# Patient Record
Sex: Female | Born: 1950
Health system: Southern US, Community
[De-identification: ages and names within clinical notes are randomized; demographics above are authoritative.]

## PROBLEM LIST (undated history)

## (undated) DIAGNOSIS — J029 Acute pharyngitis, unspecified: Secondary | ICD-10-CM

## (undated) DIAGNOSIS — N200 Calculus of kidney: Secondary | ICD-10-CM

## (undated) DIAGNOSIS — D649 Anemia, unspecified: Secondary | ICD-10-CM

## (undated) DIAGNOSIS — M810 Age-related osteoporosis without current pathological fracture: Secondary | ICD-10-CM

## (undated) DIAGNOSIS — D6800 Von Willebrand disease, unspecified: Secondary | ICD-10-CM

## (undated) HISTORY — DX: Anemia, unspecified: D64.9

## (undated) HISTORY — PX: BREAST SURGERY: SHX581

## (undated) HISTORY — DX: Acute pharyngitis, unspecified: J02.9

---
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
## 2016-07-28 ENCOUNTER — Encounter (HOSPITAL_BASED_OUTPATIENT_CLINIC_OR_DEPARTMENT_OTHER): Payer: Self-pay | Admitting: Emergency Medicine

## 2016-07-28 DIAGNOSIS — D680 Von Willebrand's disease: Principal | ICD-10-CM | POA: Insufficient documentation

## 2016-07-28 DIAGNOSIS — D696 Thrombocytopenia, unspecified: Secondary | ICD-10-CM | POA: Diagnosis not present

## 2016-07-28 DIAGNOSIS — D62 Acute posthemorrhagic anemia: Secondary | ICD-10-CM | POA: Diagnosis not present

## 2016-07-28 DIAGNOSIS — K068 Other specified disorders of gingiva and edentulous alveolar ridge: Secondary | ICD-10-CM | POA: Diagnosis present

## 2016-07-28 NOTE — ED Triage Notes (Signed)
Pt reports bleeding from gums since having a dental cleaning earlier today. Pt has hx of clotting disorder.

## 2016-07-29 ENCOUNTER — Encounter (HOSPITAL_COMMUNITY): Payer: Self-pay | Admitting: Internal Medicine

## 2016-07-29 ENCOUNTER — Observation Stay (HOSPITAL_BASED_OUTPATIENT_CLINIC_OR_DEPARTMENT_OTHER)
Admission: EM | Admit: 2016-07-29 | Discharge: 2016-07-29 | Disposition: A | Discharge status: Home / Self Care | Payer: Medicare PPO | Attending: Internal Medicine | Admitting: Internal Medicine

## 2016-07-29 DIAGNOSIS — D696 Thrombocytopenia, unspecified: Secondary | ICD-10-CM | POA: Diagnosis present

## 2016-07-29 DIAGNOSIS — D68020 Von Willebrand disease, type 2a: Secondary | ICD-10-CM | POA: Diagnosis present

## 2016-07-29 DIAGNOSIS — D62 Acute posthemorrhagic anemia: Secondary | ICD-10-CM | POA: Diagnosis present

## 2016-07-29 DIAGNOSIS — IMO0002 Reserved for concepts with insufficient information to code with codable children: Secondary | ICD-10-CM | POA: Diagnosis present

## 2016-07-29 DIAGNOSIS — D6800 Von Willebrand disease, unspecified: Secondary | ICD-10-CM

## 2016-07-29 DIAGNOSIS — K068 Other specified disorders of gingiva and edentulous alveolar ridge: Secondary | ICD-10-CM

## 2016-07-29 DIAGNOSIS — K055 Other periodontal diseases: Secondary | ICD-10-CM

## 2016-07-29 HISTORY — DX: Von Willebrand disease, unspecified: D68.00

## 2016-07-29 HISTORY — DX: Von Willebrand's disease: D68.0

## 2016-07-29 LAB — BASIC METABOLIC PANEL
ANION GAP: 5 (ref 5–15)
Anion gap: 8 (ref 5–15)
BUN: 30 mg/dL — AB (ref 6–20)
BUN: 27 mg/dL — AB (ref 6–20)
CALCIUM: 9.2 mg/dL (ref 8.9–10.3)
CALCIUM: 9.1 mg/dL (ref 8.9–10.3)
CO2: 24 mmol/L (ref 22–32)
CO2: 25 mmol/L (ref 22–32)
CREATININE: 0.77 mg/dL (ref 0.44–1.00)
CREATININE: 0.85 mg/dL (ref 0.44–1.00)
Chloride: 104 mmol/L (ref 101–111)
Chloride: 110 mmol/L (ref 101–111)
GFR calc Af Amer: >60 mL/min (ref >60)
GFR calc Af Amer: >60 mL/min (ref >60)
GLUCOSE: 91 mg/dL (ref 65–99)
GLUCOSE: 93 mg/dL (ref 65–99)
Potassium: 4.1 mmol/L (ref 3.5–5.1)
Potassium: 4.5 mmol/L (ref 3.5–5.1)
Sodium: 136 mmol/L (ref 135–145)
Sodium: 140 mmol/L (ref 135–145)

## 2016-07-29 LAB — CBC WITH DIFFERENTIAL/PLATELET
BASOS ABS: 0 10*3/uL (ref 0.0–0.1)
Basophils Relative: 0 %
EOS PCT: 1 %
Eosinophils Absolute: 0.1 10*3/uL (ref 0.0–0.7)
HEMATOCRIT: 36.8 % (ref 36.0–46.0)
Hemoglobin: 12.2 g/dL (ref 12.0–15.0)
LYMPHS PCT: 23 %
Lymphs Abs: 2.5 10*3/uL (ref 0.7–4.0)
MCH: 29.4 pg (ref 26.0–34.0)
MCHC: 33.2 g/dL (ref 30.0–36.0)
MCV: 88.7 fL (ref 78.0–100.0)
MONO ABS: 0.7 10*3/uL (ref 0.1–1.0)
MONOS PCT: 7 %
NEUTROS ABS: 7.5 10*3/uL (ref 1.7–7.7)
Neutrophils Relative %: 69 %
Platelets: 94 10*3/uL — ABNORMAL LOW (ref 150–400)
RBC: 4.15 MIL/uL (ref 3.87–5.11)
RDW: 14 % (ref 11.5–15.5)
WBC: 10.8 10*3/uL — ABNORMAL HIGH (ref 4.0–10.5)

## 2016-07-29 LAB — HEMOGLOBIN AND HEMATOCRIT, BLOOD
HCT: 36.6 % (ref 36.0–46.0)
Hemoglobin: 11.8 g/dL — ABNORMAL LOW (ref 12.0–15.0)

## 2016-07-29 MED ORDER — DESMOPRESSIN ACETATE SPRAY 0.01 % NA SOLN: 2.0000 | Freq: Every day | NASAL | 12 refills | Status: DC | PRN

## 2016-07-29 MED ORDER — SODIUM CHLORIDE 0.9 % IV SOLN
0.3000 ug/kg | Freq: Once | INTRAVENOUS | Status: AC
  Administered 2016-07-29: 19.2 ug via INTRAVENOUS
  Filled 2016-07-29: qty 4.8

## 2016-07-29 MED ORDER — DESMOPRESSIN ACETATE 4 MCG/ML IJ SOLN: 0.3000 ug/kg | Freq: Once | INTRAMUSCULAR | Status: DC

## 2016-07-29 NOTE — Discharge Summary (Signed)
Physician Discharge Summary  Meredith Dominguez ZOX:[id number] DOB: 08/16/1951 DOA: 07/29/2016  PCP: Kathyrn LassJones, Champ M, MD  Admit date: 07/29/2016 Discharge date: 07/29/2016  Admitted From: Home Discharge disposition: Home    Recommendations for Outpatient Follow-Up:   1. Recommend follow-up with PCP in one week or sooner if there are issues with ongoing bleeding.   Discharge Diagnosis:   Principal Problem:    Von Willebrand disease, type IIa (HCC) with acute blood loss anemia status post dental procedure    Thrombocytopenia  Discharge Condition: Improved.  Diet recommendation: Regular.  Wound care: None.   History of Present Illness:   65 year old woman with a PMH of von Willebrand's disease type IIa who developed bleeding from her gums after dental cleansing after failing to take her preprocedure dose of desmopressin.  Hospital Course by Problem:   Von Willebrand disease, type IIa (HCC) with acute blood loss anemia status post dental procedure/Thrombocytopenia Patient's platelet count was at her usual baseline values. Patient was observed overnight, and given desmopressin intravenously. The bleeding had largely stopped by the morning of 07/29/16. There was a mild drop in her hemoglobin from 12.2 ---> 11.8 g/dL. She was given a prescription with refills for her intranasal desmopressin. She was instructed to follow-up with her PCP for ongoing problems with bleeding.   Medical Consultants:    None.   Discharge Exam:   Vitals:   07/29/16 0431 07/29/16 1544  BP: 131/77 (!) 109/54  Pulse: 90 88  Resp: 18 18  Temp: 98.7 F (37.1 C) 98.2 F (36.8 C)   Vitals:   07/29/16 0053 07/29/16 0241 07/29/16 0431 07/29/16 1544  BP: 145/81 130/76 131/77 (!) 109/54  Pulse:  80 90 88  Resp: 18 18 18 18   Temp:   98.7 F (37.1 C) 98.2 F (36.8 C)  TempSrc:   Oral Oral  SpO2:  99% 98% 100%  Weight:   63.5 kg (140 lb)   Height:   6' (1.829 m)     General exam: Mildly  anxious. Oropharynx: No visible bleeding from the gums, but the patient does expectorate pink tinged sputum. Respiratory system: Clear to auscultation. Respiratory effort normal. Cardiovascular system: S1 & S2 heard, RRR. No JVD,  rubs, gallops or clicks. No murmurs. Gastrointestinal system: Abdomen is nondistended, soft and nontender. No organomegaly or masses felt. Normal bowel sounds heard. Central nervous system: Alert and oriented. No focal neurological deficits.  The results of significant diagnostics from this hospitalization (including imaging, microbiology, ancillary and laboratory) are listed below for reference.     Procedures and Diagnostic Studies:   No results found.   Labs:   Basic Metabolic Panel:  Recent Labs Lab 07/29/16 0125 07/29/16 1154  NA 136 140  K 4.1 4.5  CL 104 110  CO2 24 25  GLUCOSE 91 93  BUN 30* 27*  CREATININE 0.77 0.85  CALCIUM 9.2 9.1   GFR Estimated Creatinine Clearance: 64.4 mL/min (by C-G formula based on SCr of 0.85 mg/dL). Liver Function Tests: No results for input(s): AST, ALT, ALKPHOS, BILITOT, PROT, ALBUMIN in the last 168 hours. No results for input(s): LIPASE, AMYLASE in the last 168 hours. No results for input(s): AMMONIA in the last 168 hours. Coagulation profile No results for input(s): INR, PROTIME in the last 168 hours.  CBC:  Recent Labs Lab 07/29/16 0125 07/29/16 1154  WBC 10.8*  --   NEUTROABS 7.5  --   HGB 12.2 11.8*  HCT 36.8 36.6  MCV 88.7  --  PLT 94*  --      Discharge Instructions:   Discharge Instructions    Call MD for:    Complete by:  As directed   Ongoing bleeding.   Call MD for:  extreme fatigue    Complete by:  As directed   Call MD for:  persistant dizziness or light-headedness    Complete by:  As directed   Diet general    Complete by:  As directed   Discharge instructions    Complete by:  As directed   You may use the intranasal desmopressin again this evening for ongoing bleeding.   This medicine should not be used more often than every 14 hours.  You had an IV dose of this medicine at 9 a.m. There fore you should not use it again until 11 pm, if it is needed. Your hemoglobin is stable.  You should call your primary care doctor if you have ongoing problems with bleeding.   Increase activity slowly    Complete by:  As directed       Medication List    TAKE these medications   desmopressin 0.01 % solution Commonly known as:  DDAVP NASAL Place 2 sprays (20 mcg total) into the nose daily as needed. Nose bleeds   OVER THE COUNTER MEDICATION Take 2 tablets by mouth 3 (three) times daily.   OVER THE COUNTER MEDICATION 3 tablets daily   OVER THE COUNTER MEDICATION Take 1 tablet by mouth 2 (two) times daily.   OVER THE COUNTER MEDICATION Take 1 tablet by mouth 3 (three) times daily.      Follow-up Information    Kathyrn Lass, MD. Schedule an appointment as soon as possible for a visit in 1 week(s).   Specialty:  Family Medicine Why:  Hospital follow up. Contact information: [address] [place] [address] [phone]            Time coordinating discharge: 25 minutes.  Signed:  Euclide Granito  Pager [phone] [facility] 07/29/2016, 4:11 PM

## 2016-07-29 NOTE — ED Provider Notes (Signed)
[facility] Provider Note   CSN: [id number] Arrival date & time: 07/28/16  2205     History   Chief Complaint Chief Complaint  Patient presents with  . Coagulation Disorder    HPI Meredith Dominguez is a 65 y.o. female with a history of von Willebrand's disease. She had a dental cleaning at 4 PM yesterday afternoon. About 5 PM she started bleeding from her gums. Bleeding has been moderate and persistent. She had forgotten to take her preprocedure dose of desmopressin but did take it about 9 PM without its usual beneficial effects. She denies pain.   HPI  Past Medical History:  Diagnosis Date  . Von Willebrand disease [facility](HCC)     Patient Active Problem List   Diagnosis Date Noted  . Post-op bleeding 07/29/2016    History reviewed. No pertinent surgical history.  OB History    No data available       Home Medications    Prior to Admission medications   Not on File    Family History No family history on file.  Social History Social History  Substance Use Topics  . Smoking status: Never Smoker  . Smokeless tobacco: Never Used  . Alcohol use 0.6 oz/week    1 Glasses of wine per week     Allergies   Sulfa antibiotics   Review of Systems Review of Systems  All other systems reviewed and are negative.   Physical Exam Updated Vital Signs BP 130/76 (BP Location: Right Arm)   Pulse 80   Temp 98.1 F (36.7 C) (Oral)   Resp 18   Ht 5\' 7"  (1.702 m)   Wt 140 lb (63.5 kg)   SpO2 99%   BMI 21.93 kg/m   Physical Exam General: Well-developed, well-nourished female in no acute distress; appearance consistent with age of record HENT: normocephalic; atraumatic; bleeding from multiple sites of gums Eyes: pupils equal, round and reactive to light; extraocular muscles intact Neck: supple Heart: regular rate and rhythm Lungs: clear to auscultation bilaterally Abdomen: soft; nondistended; nontender; no masses or hepatosplenomegaly; bowel sounds  present Extremities: No deformity; full range of motion; pulses normal Neurologic: Awake, alert and oriented; motor function intact in all extremities and symmetric; no facial droop Skin: Warm and dry Psychiatric: Normal mood and affect   ED Treatments / Results  Nursing notes and vitals signs, including pulse oximetry, reviewed.  Summary of this visit's results, reviewed by myself:  Labs:  Results for orders placed or performed during the hospital encounter of 07/29/16 (from the past 24 hour(s))  CBC with Differential/Platelet     Status: Abnormal   Collection Time: 07/29/16  1:25 AM  Result Value Ref Range   WBC 10.8 (H) 4.0 - 10.5 K/uL   RBC 4.15 3.87 - 5.11 MIL/uL   Hemoglobin 12.2 12.0 - 15.0 g/dL   HCT 40.936.8 81.136.0 - 91.446.0 %   MCV 88.7 78.0 - 100.0 fL   MCH 29.4 26.0 - 34.0 pg   MCHC 33.2 30.0 - 36.0 g/dL   RDW 78.214.0 95.611.5 - 21.315.5 %   Platelets 94 (L) 150 - 400 K/uL   Neutrophils Relative % 69 %   Neutro Abs 7.5 1.7 - 7.7 K/uL   Lymphocytes Relative 23 %   Lymphs Abs 2.5 0.7 - 4.0 K/uL   Monocytes Relative 7 %   Monocytes Absolute 0.7 0.1 - 1.0 K/uL   Eosinophils Relative 1 %   Eosinophils Absolute 0.1 0.0 - 0.7 K/uL   Basophils  Relative 0 %   Basophils Absolute 0.0 0.0 - 0.1 K/uL  Basic metabolic panel     Status: Abnormal   Collection Time: 07/29/16  1:25 AM  Result Value Ref Range   Sodium 136 135 - 145 mmol/L   Potassium 4.1 3.5 - 5.1 mmol/L   Chloride 104 101 - 111 mmol/L   CO2 24 22 - 32 mmol/L   Glucose, Bld 91 65 - 99 mg/dL   BUN 30 (H) 6 - 20 mg/dL   Creatinine, Ser 5.78 0.44 - 1.00 mg/dL   Calcium 9.2 8.9 - 46.9 mg/dL   GFR calc non Af Amer >60 >60 mL/min   GFR calc Af Amer >60 >60 mL/min   Anion gap 8 5 - 15    1:38 AM  Tranexamic acid or aminocaproic acid are the agents used adjunctively with desmopressin in the treatment of mucosal von Willebrand's related bleeding. Neither of these drugs are stocked at this facility. We will transfer her to [facility]  for further treatment. Dr. Julian Reil is the accepting physician.  Procedures (including critical care time)   Final Clinical Impressions(s) / ED Diagnoses   Final diagnoses:  Bleeding gums  Von Willebrand disease (HCC)      Paula Libra, MD 07/29/16 0425

## 2016-07-29 NOTE — Progress Notes (Signed)
Pt discharged to home.  Discharge instructions explained to pt.  Pt has no questions at the time of discharge.  Pt states she has all belongings.  IV removed.  Pt ambulated off unit on her own.   

## 2016-07-29 NOTE — Care Management Obs Status (Signed)
MEDICARE OBSERVATION STATUS NOTIFICATION   Patient Details  Name: Meredith Dominguez MRN: [id number] Date of Birth: 06/24/1951   Medicare Observation Status Notification Given:  Yes    Kingsley PlanWile, Tymber Stallings Marie, RN 07/29/2016, 10:13 AM

## 2016-07-29 NOTE — H&P (Addendum)
History and Physical    Meredith ArisMarilyn Nahm ZOX:[id number] DOB: 01/20/1951 DOA: 07/29/2016   PCP: Kathyrn LassJones, Champ M, MD Chief Complaint:  Chief Complaint  Patient presents with  . Coagulation Disorder    HPI: Meredith Dominguez is a 65 y.o. female with medical history significant of von Willebrand's disease. She had a dental cleaning at 4 PM yesterday afternoon. About 5 PM she started bleeding from her gums. Bleeding has been moderate and persistent. She had forgotten to take her preprocedure dose of desmopressin but did take it about 9 PM without its usual beneficial effects.  She does note that this DDAVP she took was stored in a fridge, and previously was stored at above the 78c that it was supposed to be.  Also the bottle she shows me expired in March of this year.  She denies pain.  ED Course: Bleeding has been persistent, [facility]  Review of Systems: As per HPI otherwise 10 point review of systems negative.    Past Medical History:  Diagnosis Date  . Von Willebrand disease (HCC)     History reviewed. No pertinent surgical history.   reports that she has never smoked. She has never used smokeless tobacco. She reports that she drinks about 0.6 oz of alcohol per week . Her drug history is not on file.  Allergies  Allergen Reactions  . Sulfa Antibiotics Rash    Family History  Problem Relation Age of Onset  . Leukemia Cousin   . Bleeding Disorder Neg Hx    "everyone" on moms side had cancer, but no known VWD or bleeding disorders.   Prior to Admission medications   Not on File    Physical Exam: Vitals:   07/28/16 2210 07/29/16 0053 07/29/16 0241 07/29/16 0431  BP: 143/81 145/81 130/76 131/77  Pulse: 78  80 90  Resp: 18 18 18 18   Temp: 98.1 F (36.7 C)   98.7 F (37.1 C)  TempSrc: Oral   Oral  SpO2: 96%  99% 98%  Weight: 63.5 kg (140 lb)   63.5 kg (140 lb)  Height: 5\' 7"  (1.702 m)   6' (1.829 m)      Constitutional: NAD, calm, comfortable Eyes:  PERRL, lids and conjunctivae normal ENMT: Mucous membranes are moist. Posterior pharynx clear of any exudate or lesions.Normal dentition.  Neck: normal, supple, no masses, no thyromegaly Respiratory: clear to auscultation bilaterally, no wheezing, no crackles. Normal respiratory effort. No accessory muscle use.  Cardiovascular: Regular rate and rhythm, no murmurs / rubs / gallops. No extremity edema. 2+ pedal pulses. No carotid bruits.  Abdomen: no tenderness, no masses palpated. No hepatosplenomegaly. Bowel sounds positive.  Musculoskeletal: no clubbing / cyanosis. No joint deformity upper and lower extremities. Good ROM, no contractures. Normal muscle tone.  Skin: no rashes, lesions, ulcers. No induration Neurologic: CN 2-12 grossly intact. Sensation intact, DTR normal. Strength 5/5 in all 4.  Psychiatric: Normal judgment and insight. Alert and oriented x 3. Normal mood.    Labs on Admission: I have personally reviewed following labs and imaging studies  CBC:  Recent Labs Lab 07/29/16 0125  WBC 10.8*  NEUTROABS 7.5  HGB 12.2  HCT 36.8  MCV 88.7  PLT 94*   Basic Metabolic Panel:  Recent Labs Lab 07/29/16 0125  NA 136  K 4.1  CL 104  CO2 24  GLUCOSE 91  BUN 30*  CREATININE 0.77  CALCIUM 9.2   GFR: Estimated Creatinine Clearance: 68.4 mL/min (by C-G formula based on SCr  of 0.8 mg/dL). Liver Function Tests: No results for input(s): AST, ALT, ALKPHOS, BILITOT, PROT, ALBUMIN in the last 168 hours. No results for input(s): LIPASE, AMYLASE in the last 168 hours. No results for input(s): AMMONIA in the last 168 hours. Coagulation Profile: No results for input(s): INR, PROTIME in the last 168 hours. Cardiac Enzymes: No results for input(s): CKTOTAL, CKMB, CKMBINDEX, TROPONINI in the last 168 hours. BNP (last 3 results) No results for input(s): PROBNP in the last 8760 hours. HbA1C: No results for input(s): HGBA1C in the last 72 hours. CBG: No results for input(s): GLUCAP  in the last 168 hours. Lipid Profile: No results for input(s): CHOL, HDL, LDLCALC, TRIG, CHOLHDL, LDLDIRECT in the last 72 hours. Thyroid Function Tests: No results for input(s): TSH, T4TOTAL, FREET4, T3FREE, THYROIDAB in the last 72 hours. Anemia Panel: No results for input(s): VITAMINB12, FOLATE, FERRITIN, TIBC, IRON, RETICCTPCT in the last 72 hours. Urine analysis: No results found for: COLORURINE, APPEARANCEUR, LABSPEC, PHURINE, GLUCOSEU, HGBUR, BILIRUBINUR, KETONESUR, PROTEINUR, UROBILINOGEN, NITRITE, LEUKOCYTESUR Sepsis Labs: @LABRCNTIP (procalcitonin:4,lacticidven:4) )No results found for this or any previous visit (from the past 240 hour(s)).   Radiological Exams on Admission: No results found.  EKG: Independently reviewed.  Assessment/Plan Active Problems:   Post-op bleeding   Von Willebrand disease, type IIa (HCC)    1. vWD type 2a -  1. Type determined from Dr. Nicky Pugh notes in care everywhere in epic. 2. Spoke with Dr. Shirline Frees: 1. Type 2a should be responsive to DDAVP 1. See HPI but it is unclear wether the nasal spray she took today was effective due to expiration, storage issues, etc. 2. Give 0.44mcg/kg DDAVP dose x1 3. If this fails then next step will be Amicar 3. Warned patient not to drink too much fluid after taking DDAVP 4. Will recheck BMP at noon to keep an eye on sodium   DVT prophylaxis: SCDs only due to active bleed Code Status: Full Family Communication: Daughter at bedside Consults called: Spoke with Dr. Shirline Frees over phone Admission status: Admit to obs   Hillary Bow DO [facility] Pager [phone] from 7PM-7AM  If 7AM-7PM, please contact the day physician for the patient [email or web address] Password [email or web address]  07/29/2016, 6:16 AM

## 2016-07-29 NOTE — ED Notes (Signed)
Pt leaving [facility] via P[facility] 6[facility] is expecting her. Also ED charge RN Onalee HuaDavid notified that [facility]

## 2016-07-29 NOTE — ED Notes (Signed)
Patient denies pain and is resting comfortably.  

## 2016-07-29 NOTE — Progress Notes (Signed)
Offered pt. A bath and pt. Refused stating she is going home so she will do it at home

## 2016-12-02 DIAGNOSIS — D696 Thrombocytopenia, unspecified: Secondary | ICD-10-CM | POA: Diagnosis not present

## 2016-12-02 DIAGNOSIS — D680 Von Willebrand's disease: Secondary | ICD-10-CM | POA: Diagnosis not present

## 2016-12-22 ENCOUNTER — Emergency Department (HOSPITAL_BASED_OUTPATIENT_CLINIC_OR_DEPARTMENT_OTHER)
Admission: EM | Admit: 2016-12-22 | Discharge: 2016-12-22 | Disposition: A | Discharge status: Home / Self Care | Payer: PPO | Attending: Emergency Medicine | Admitting: Emergency Medicine

## 2016-12-22 ENCOUNTER — Telehealth: Payer: Self-pay | Admitting: Family Medicine

## 2016-12-22 ENCOUNTER — Encounter (HOSPITAL_BASED_OUTPATIENT_CLINIC_OR_DEPARTMENT_OTHER): Payer: Self-pay | Admitting: *Deleted

## 2016-12-22 ENCOUNTER — Emergency Department (HOSPITAL_BASED_OUTPATIENT_CLINIC_OR_DEPARTMENT_OTHER): Payer: PPO

## 2016-12-22 DIAGNOSIS — R05 Cough: Secondary | ICD-10-CM | POA: Diagnosis not present

## 2016-12-22 DIAGNOSIS — J029 Acute pharyngitis, unspecified: Secondary | ICD-10-CM | POA: Diagnosis not present

## 2016-12-22 DIAGNOSIS — D6800 Von Willebrand disease, unspecified: Secondary | ICD-10-CM

## 2016-12-22 DIAGNOSIS — D696 Thrombocytopenia, unspecified: Secondary | ICD-10-CM

## 2016-12-22 DIAGNOSIS — J111 Influenza due to unidentified influenza virus with other respiratory manifestations: Secondary | ICD-10-CM | POA: Diagnosis not present

## 2016-12-22 DIAGNOSIS — R509 Fever, unspecified: Secondary | ICD-10-CM | POA: Diagnosis not present

## 2016-12-22 DIAGNOSIS — R69 Illness, unspecified: Secondary | ICD-10-CM

## 2016-12-22 LAB — URINALYSIS, ROUTINE W REFLEX MICROSCOPIC
Bilirubin Urine: NEGATIVE
Glucose, UA: NEGATIVE mg/dL
KETONES UR: NEGATIVE mg/dL
LEUKOCYTES UA: NEGATIVE
NITRITE: NEGATIVE
PH: 6.5 (ref 5.0–8.0)
Protein, ur: 100 mg/dL — AB
SPECIFIC GRAVITY, URINE: 1.015 (ref 1.005–1.030)

## 2016-12-22 LAB — RAPID STREP SCREEN (MED CTR MEBANE ONLY): Streptococcus, Group A Screen (Direct): NEGATIVE

## 2016-12-22 LAB — CBC
HCT: 39.5 % (ref 36.0–46.0)
HEMOGLOBIN: 13.2 g/dL (ref 12.0–15.0)
MCH: 29.1 pg (ref 26.0–34.0)
MCHC: 33.4 g/dL (ref 30.0–36.0)
MCV: 87.2 fL (ref 78.0–100.0)
Platelets: 37 10*3/uL — ABNORMAL LOW (ref 150–400)
RBC: 4.53 MIL/uL (ref 3.87–5.11)
RDW: 14.4 % (ref 11.5–15.5)
WBC: 15.8 10*3/uL — ABNORMAL HIGH (ref 4.0–10.5)

## 2016-12-22 LAB — URINALYSIS, MICROSCOPIC (REFLEX)

## 2016-12-22 MED ORDER — OSELTAMIVIR PHOSPHATE 75 MG PO CAPS: 75.0000 mg | ORAL_CAPSULE | Freq: Two times a day (BID) | ORAL | 0 refills | Status: DC

## 2016-12-22 MED ORDER — PENICILLIN V POTASSIUM 500 MG PO TABS: 500.0000 mg | ORAL_TABLET | Freq: Three times a day (TID) | ORAL | 0 refills | Status: DC

## 2016-12-22 MED ORDER — ACETAMINOPHEN 500 MG PO TABS
1000.0000 mg | ORAL_TABLET | Freq: Once | ORAL | Status: AC
  Administered 2016-12-22: 1000 mg via ORAL
  Filled 2016-12-22: qty 2

## 2016-12-22 NOTE — Telephone Encounter (Signed)
Per chart, pt went to the ER as advised.   

## 2016-12-22 NOTE — Telephone Encounter (Signed)
Patient Name: Meredith ArisMARILYN Lore  DOB: 05/11/1951    Initial Comment Caller says she has a fever of 100.2, her saliva is very thick and a sore throat. She says she has jammed her jaw trying to swallow the thick saliva she is having.    Nurse Assessment  Nurse: Scarlette ArStandifer, RN, Heather Date/Time (Eastern Time): 12/22/2016 1:37:39 PM  Confirm and document reason for call. If symptomatic, describe symptoms. ---Caller says she has a fever of 100.2, her saliva is very thick and a sore throat. She says she has jammed her jaw trying to swallow the thick saliva she is having. She has been sick for 5 days, but the sore throat for 3 days.  Does the patient have any new or worsening symptoms? ---Yes  Will a triage be completed? ---Yes  Related visit to physician within the last 2 weeks? ---No  Does the PT have any chronic conditions? (i.e. diabetes, asthma, etc.) ---Yes  List chronic conditions. ---Von Willebrand Disease  Is this a behavioral health or substance abuse call? ---No     Guidelines    Guideline Title Affirmed Question Affirmed Notes  Sore Throat Patient sounds very sick or weak to the triager    Final Disposition User   Go to ED Now (or PCP triage) Scarlette ArStandifer, RN, Heather    Referrals  [facility] - ED   Disagree/Comply: Comply

## 2016-12-22 NOTE — Discharge Instructions (Signed)
Return to the ED with any concerns including difficulty breathing or swallowing, vomiting and not able to keep down liquids or medications, fainting, increased bleeding from nose or mouth, blood in stool, decreased level of alertness/lethargy, or any other alarming symptoms

## 2016-12-22 NOTE — ED Triage Notes (Signed)
Fever, cough, sore throat, body aches.

## 2016-12-22 NOTE — ED Provider Notes (Signed)
[facility]-EMER[facility] DEPT Provider Note   CSN: [id number] Arrival date & time: 12/22/16  1501     History   Chief Complaint Chief Complaint  Patient presents with  . Fever  . Cough    HPI Meredith Dominguez is a 66 y.o. female.  HPI  Pt presenting with c/o cough, congestion, fever and sore throat. Pt states that the congestion and cough began 5 days ago.  She began to have sore throat 3 days ago, worse on the right side.  Pt does not know how high her fever has been.  No difficulty breathing.   She has had some bleeding of her nose with blowing her nose due to hx of von willebrands.  She denies other area of bruising or gum bleeding.  She has no known sick contacts.  She has no difficulty breathing or swallowing- but pain with swallowing.  There are no other associated systemic symptoms, there are no other alleviating or modifying factors.   Past Medical History:  Diagnosis Date  . Von Willebrand disease [facility])     Patient Active Problem List   Diagnosis Date Noted  . Von Willebrand disease, type IIa (HCC) 07/29/2016  . Acute blood loss anemia 07/29/2016  . Thrombocytopenia (HCC) 07/29/2016  . Bleeding gums     History reviewed. No pertinent surgical history.  OB History    No data available       Home Medications    Prior to Admission medications   Medication Sig Start Date End Date Taking? Authorizing Provider  clindamycin (CLEOCIN) 150 MG capsule Take 2 capsules (300 mg total) by mouth 3 (three) times daily. May dispense as 150mg  capsules 12/23/16   Garlon Hatchet, PA-C  desmopressin (DDAVP NASAL) 0.01 % solution Place 2 sprays (20 mcg total) into the nose daily as needed. Nose bleeds 07/29/16   Maryruth Bun Rama, MD  oseltamivir (TAMIFLU) 75 MG capsule Take 1 capsule (75 mg total) by mouth every 12 (twelve) hours. 12/22/16   Jerelyn Scott, MD  OVER THE COUNTER MEDICATION Take 2 tablets by mouth 3 (three) times daily.    Historical Provider, MD  OVER THE COUNTER MEDICATION 3  tablets daily    Historical Provider, MD  OVER THE COUNTER MEDICATION Take 1 tablet by mouth 2 (two) times daily.    Historical Provider, MD  OVER THE COUNTER MEDICATION Take 1 tablet by mouth 3 (three) times daily.    Historical Provider, MD  penicillin v potassium (VEETID) 500 MG tablet Take 1 tablet (500 mg total) by mouth 3 (three) times daily. 12/22/16 12/29/16  Jerelyn Scott, MD    Family History Family History  Problem Relation Age of Onset  . Leukemia Cousin   . Bleeding Disorder Neg Hx     Social History Social History  Substance Use Topics  . Smoking status: Never Smoker  . Smokeless tobacco: Never Used  . Alcohol use Yes     Comment: occ     Allergies   Sulfa antibiotics   Review of Systems Review of Systems  ROS reviewed and all otherwise negative except for mentioned in HPI   Physical Exam Updated Vital Signs BP 135/77 (BP Location: Left Arm)   Pulse 105   Temp 98.9 F (37.2 C) (Oral)   Resp 20   Ht 5' 6.5" (1.689 m)   Wt 67.6 kg   SpO2 99%   BMI 23.69 kg/m  Vitals reviewed Physical Exam Physical Examination: General appearance - alert, well appearing, and in no  distress Mental status - alert, oriented to person, place, and time Eyes - bilateral mild conjunctival injection Nose- no active bleeding Mouth - mucous membranes moist, pharynx normal without lesions, no bleeding Neck - supple, no significant adenopathy Chest - clear to auscultation, no wheezes, rales or rhonchi, symmetric air entry Heart - normal rate, regular rhythm, normal S1, S2, no murmurs, rubs, clicks or gallops Abdomen - soft, nontender, nondistended, no masses or organomegaly Neurological - alert, oriented, normal speech Extremities - peripheral pulses normal, no pedal edema, no clubbing or cyanosis Skin - normal coloration and turgor, no rashes, no bruising or bleeding  ED Treatments / Results  Labs (all labs ordered are listed, but only abnormal results are displayed) Labs  Reviewed  URINALYSIS, ROUTINE W REFLEX MICROSCOPIC - Abnormal; Notable for the following:       Result Value   APPearance CLOUDY (*)    Hgb urine dipstick LARGE (*)    Protein, ur 100 (*)    All other components within normal limits  URINALYSIS, MICROSCOPIC (REFLEX) - Abnormal; Notable for the following:    Bacteria, UA FEW (*)    Squamous Epithelial / LPF 0-5 (*)    All other components within normal limits  CBC - Abnormal; Notable for the following:    WBC 15.8 (*)    Platelets 37 (*)    All other components within normal limits  RAPID STREP SCREEN (NOT AT [facility])  CULTURE, GROUP A STREP [facility])    EKG  EKG Interpretation None       Radiology   Procedures Procedures (including critical care time)  Medications Ordered in ED Medications  acetaminophen (TYLENOL) tablet 1,000 mg (1,000 mg Oral Given 12/22/16 1749)     Initial Impression / Assessment and Plan / ED Course  I have reviewed the triage vital signs and the nursing notes.  Pertinent labs & imaging results that were available during my care of the patient were reviewed by me and considered in my medical decision making (see chart for details).     Pt presenting with c/o cough, sore throat, nasal congestion.  She has negative strep, but sore throat is worse on the right- there is no evidence of PTA at this time on exam- palate is symmetric and uvula midline, but given her hx of von willebrands and suspicion for bacterial infection will start on penicillin for presumed strep.  cxr is negative.  Will also start on tamiflu in case of influenza as well.  Pt is having some mild pink nasal mucous when blowing her nose, also some RBCs in urine- she is thrombocytopenic at 6237- she has not used her DDAVP but does have this at home.  Her HGB is stable and there is no acute bleeding, no epistaxis.  Encouraged her to use her DDAVP.  Close followup as an outpatient.  Pt verbalized understanding and agreement with this plan.   Discharged with strict return precautions.  Pt agreeable with plan.  Final Clinical Impressions(s) / ED Diagnoses   Final diagnoses:  Pharyngitis, unspecified etiology  Influenza-like illness  Thrombocytopenia (HCC)  Von Willebrand disease (HCC)    New Prescriptions Discharge Medication List as of 12/22/2016  6:45 PM    START taking these medications   Details  oseltamivir (TAMIFLU) 75 MG capsule Take 1 capsule (75 mg total) by mouth every 12 (twelve) hours., Starting Tue 12/22/2016, Print    penicillin v potassium (VEETID) 500 MG tablet Take 1 tablet (500 mg total) by mouth 3 (three)  times daily., Starting Tue 12/22/2016, Until Tue 12/29/2016, Print         Jerelyn Scott, MD 12/23/16 [phone]

## 2016-12-23 ENCOUNTER — Ambulatory Visit (INDEPENDENT_AMBULATORY_CARE_PROVIDER_SITE_OTHER): Payer: PPO | Admitting: Family Medicine

## 2016-12-23 ENCOUNTER — Encounter: Payer: Self-pay | Admitting: Family Medicine

## 2016-12-23 ENCOUNTER — Emergency Department (HOSPITAL_BASED_OUTPATIENT_CLINIC_OR_DEPARTMENT_OTHER): Payer: PPO

## 2016-12-23 ENCOUNTER — Emergency Department (HOSPITAL_BASED_OUTPATIENT_CLINIC_OR_DEPARTMENT_OTHER)
Admission: EM | Admit: 2016-12-23 | Discharge: 2016-12-23 | Disposition: A | Discharge status: Home / Self Care | Payer: PPO | Attending: Emergency Medicine | Admitting: Emergency Medicine

## 2016-12-23 ENCOUNTER — Encounter (HOSPITAL_BASED_OUTPATIENT_CLINIC_OR_DEPARTMENT_OTHER): Payer: Self-pay

## 2016-12-23 VITALS — BP 126/79 | HR 95 | Temp 101.0°F | Ht 66.5 in | Wt 149.4 lb

## 2016-12-23 DIAGNOSIS — J029 Acute pharyngitis, unspecified: Secondary | ICD-10-CM | POA: Diagnosis not present

## 2016-12-23 DIAGNOSIS — R221 Localized swelling, mass and lump, neck: Secondary | ICD-10-CM | POA: Diagnosis not present

## 2016-12-23 DIAGNOSIS — D6800 Von Willebrand disease, unspecified: Secondary | ICD-10-CM

## 2016-12-23 DIAGNOSIS — J36 Peritonsillar abscess: Secondary | ICD-10-CM | POA: Insufficient documentation

## 2016-12-23 DIAGNOSIS — R5381 Other malaise: Secondary | ICD-10-CM

## 2016-12-23 LAB — CBC WITH DIFFERENTIAL/PLATELET
BASOS ABS: 0 10*3/uL (ref 0.0–0.1)
Basophils Relative: 0 %
Eosinophils Absolute: 0 10*3/uL (ref 0.0–0.7)
Eosinophils Relative: 0 %
HCT: 38.8 % (ref 36.0–46.0)
HEMOGLOBIN: 13.1 g/dL (ref 12.0–15.0)
LYMPHS PCT: 7 %
Lymphs Abs: 1.3 10*3/uL (ref 0.7–4.0)
MCH: 29.4 pg (ref 26.0–34.0)
MCHC: 33.8 g/dL (ref 30.0–36.0)
MCV: 87 fL (ref 78.0–100.0)
MONOS PCT: 8 %
Monocytes Absolute: 1.5 10*3/uL — ABNORMAL HIGH (ref 0.1–1.0)
NEUTROS ABS: 15.8 10*3/uL — AB (ref 1.7–7.7)
Neutrophils Relative %: 85 %
Platelets: 45 10*3/uL — ABNORMAL LOW (ref 150–400)
RBC: 4.46 MIL/uL (ref 3.87–5.11)
RDW: 14.3 % (ref 11.5–15.5)
WBC: 18.6 10*3/uL — AB (ref 4.0–10.5)

## 2016-12-23 LAB — BASIC METABOLIC PANEL
ANION GAP: 7 (ref 5–15)
BUN: 19 mg/dL (ref 6–20)
CALCIUM: 8.9 mg/dL (ref 8.9–10.3)
CHLORIDE: 98 mmol/L — AB (ref 101–111)
CO2: 26 mmol/L (ref 22–32)
CREATININE: 0.88 mg/dL (ref 0.44–1.00)
GFR calc non Af Amer: >60 mL/min (ref >60)
Glucose, Bld: 108 mg/dL — ABNORMAL HIGH (ref 65–99)
Potassium: 4.4 mmol/L (ref 3.5–5.1)
SODIUM: 131 mmol/L — AB (ref 135–145)

## 2016-12-23 LAB — POCT INFLUENZA A/B
INFLUENZA A, POC: NEGATIVE
INFLUENZA B, POC: NEGATIVE

## 2016-12-23 MED ORDER — CLINDAMYCIN HCL 150 MG PO CAPS: 300.0000 mg | ORAL_CAPSULE | Freq: Three times a day (TID) | ORAL | 0 refills | Status: DC

## 2016-12-23 MED ORDER — CLINDAMYCIN PHOSPHATE 600 MG/50ML IV SOLN
600.0000 mg | Freq: Once | INTRAVENOUS | Status: AC
Start: 2016-12-23 — End: 2016-12-23
  Administered 2016-12-23: 600 mg via INTRAVENOUS
  Filled 2016-12-23: qty 50

## 2016-12-23 MED ORDER — DEXAMETHASONE SODIUM PHOSPHATE 10 MG/ML IJ SOLN
10.0000 mg | Freq: Once | INTRAMUSCULAR | Status: AC
Start: 2016-12-23 — End: 2016-12-23
  Administered 2016-12-23: 10 mg via INTRAVENOUS
  Filled 2016-12-23: qty 1

## 2016-12-23 MED ORDER — IOPAMIDOL (ISOVUE-300) INJECTION 61%
100.0000 mL | Freq: Once | INTRAVENOUS | Status: AC | PRN
  Administered 2016-12-23: 75 mL via INTRAVENOUS

## 2016-12-23 MED FILL — CLINDAMYCIN HCL 150 MG CAP: 150 | 10 days supply | Qty: 60 | Fill #0

## 2016-12-23 NOTE — Discharge Instructions (Signed)
Take the prescribed medication as directed.  You can stop taking the penicillin and tamiflu you were prescribed yesterday. Follow-up with ENT next week--- call their office to make appt but if symptoms worsening, call back so you can be seen sooner. Return to the ED for new or worsening symptoms.

## 2016-12-23 NOTE — ED Notes (Signed)
ED Provider at bedside. 

## 2016-12-23 NOTE — Progress Notes (Signed)
[facility] [address], [address] [place], [place] [address] [phone] [phone]  Date:  12/23/2016   Name:  Meredith ArisMarilyn Dominguez   DOB:  01/18/1951   MRN:  [id number]  PCP:  Kathyrn LassJones, Champ M, MD    Chief Complaint: Sore Throat (Pt seen in ER 12/12/16. c/o sore throat, prod cough with clear mucus that has blood when coughing deep and fever x 6 days.)   History of Present Illness:  Meredith Dominguez is a 66 y.o. very pleasant female patient who presents with the following:  Here today for an acute sick visit- history of von willebrand's disease  [facility] last visit there on 1/10, at that time her platelets were 160k manually, she did not need a transfusion She has been sick for about 5- 6 days.   She was seen in the ER yesterday- note is not yet available but pt reports she was tested for strep- negative- and given rx for tamiflu and penicillin.  She did not start either med as she was not sure if the needed them She also had a chest x-ray that was negative yesterday  She has noted a cough and ST for about 5 days total She has also noted body aches.  Some fever This am she noted "burning around the vagina like I"m trying to fight something."  This lasted about 2 minutes and resolved.  It was not dysuria.    She notes that she is coughing up some blood tinged sputum when she will cough hard She has not noted any SOB and feels that she is breathing ok  Patient Active Problem List   Diagnosis Date Noted  . Von Willebrand disease, type IIa (HCC) 07/29/2016  . Acute blood loss anemia 07/29/2016  . Thrombocytopenia (HCC) 07/29/2016  . Bleeding gums     Past Medical History:  Diagnosis Date  . Von Willebrand disease (HCC)     No past surgical history on file.  Social History  Substance Use Topics  . Smoking status: Never Smoker  . Smokeless tobacco: Never Used  . Alcohol use 0.6 oz/week    1 Glasses of wine per week     Family History  Problem Relation Age of Onset  . Leukemia Cousin   . Bleeding Disorder Neg Hx     Allergies  Allergen Reactions  . Sulfa Antibiotics Rash    Medication list has been reviewed and updated.  Current Outpatient Prescriptions on File Prior to Visit  Medication Sig Dispense Refill  . desmopressin (DDAVP NASAL) 0.01 % solution Place 2 sprays (20 mcg total) into the nose daily as needed. Nose bleeds 5 mL 12  . oseltamivir (TAMIFLU) 75 MG capsule Take 1 capsule (75 mg total) by mouth every 12 (twelve) hours. 10 capsule 0  . OVER THE COUNTER MEDICATION Take 2 tablets by mouth 3 (three) times daily.    Marland Kitchen. OVER THE COUNTER MEDICATION 3 tablets daily    . OVER THE COUNTER MEDICATION Take 1 tablet by mouth 2 (two) times daily.    Marland Kitchen. OVER THE COUNTER MEDICATION Take 1 tablet by mouth 3 (three) times daily.    . penicillin v potassium (VEETID) 500 MG tablet Take 1 tablet (500 mg total) by mouth 3 (three) times daily. 21 tablet 0   No current facility-administered medications on file prior to visit.     Review of Systems:  As per HPI- otherwise negative.   Physical Examination:  Blood pressure 126/79, pulse 95, temperature (!) 101 F (38.3 C), temperature source Oral, height 5' 6.5" (1.689 m), weight 149 lb 6.4 oz (67.8 kg), SpO2 97 %.  Vitals:   12/23/16 1152  Weight: 149 lb 6.4 oz (67.8 kg)  Height: 5' 6.5" (1.689 m)   Body mass index is 23.75 kg/m. Ideal Body Weight: Weight in (lb) to have BMI = 25: 156.9  GEN: WDWN, NAD,  A & O x 3, does not appear to feel well HEENT: Atraumatic, Normocephalic. Neck supple. No masses, tender cervical LAD.  Bilateral TM wnl, oropharynx shows an edematous uvula that is displaced to the left side. Right oropharynx is red and swollen ?abscess.   PEERL,EOMI.   Ears and Nose: No external deformity. CV: RRR, No M/G/R. No JVD. No thrill. No extra heart sounds. PULM: CTA B, no wheezes, crackles, rhonchi. No retractions. No resp.  distress. No accessory muscle use. ABD: S, NT, ND EXTR: No c/c/e NEURO Normal gait.  PSYCH: Normally interactive. Conversant. Not depressed or anxious appearing.  Calm demeanor.   Results for orders placed or performed in visit on 12/23/16  POCT Influenza A/B  Result Value Ref Range   Influenza A, POC Negative Negative   Influenza B, POC Negative Negative     Assessment and Plan: Malaise - Plan: POCT Influenza A/B  Acute pharyngitis, unspecified etiology  Swollen uvula  Von Willebrand's disease (HCC)  History of Von Willebrand's disease.  Here today with acute illness. Flu test negative.  I am concerned that she may have a peri-tonsillar abscess.   We will transport her to the ER- appreciate ED care of this nice patient  Signed Abbe Amsterdam, MD

## 2016-12-23 NOTE — ED Notes (Signed)
Patient transported to CT 

## 2016-12-23 NOTE — ED Provider Notes (Signed)
[facility]   CSN: [id number] Arrival date & time: 12/23/16  1234     History   Chief Complaint Chief Complaint  Patient presents with  . Cough    HPI Janace ArisMarilyn Yearwood is a 66 y.o. female.  The history is provided by the patient and medical records.  Cough  Associated symptoms include sore throat.    66 year old female with history of von Willebrand's disease and chronic thrombocytopenia, presenting to the ED for sore throat and fever. Patient was seen here yesterday and had chest x-ray, rapid strep test, and urinalysis performed. She was discharged home on Tamiflu and penicillin. States she followed up with her doctor today, but was sent here with concern for peritonsillar abscess. Patient reports she is having some difficulty swallowing as she feels there is some fullness on the right side of her throat. States she has been spitting out her secretions this morning intermittently. She denies any shortness of breath or difficulty breathing. Reports continued fever around 101F.  no chest pain. States she was able to swallow some Tylenol this morning for fever. She has mostly been drinking shakes, yogurt, and other soft foods and limiting solid food intake due to her throat pain.  Past Medical History:  Diagnosis Date  . Von Willebrand disease [facility](HCC)     Patient Active Problem List   Diagnosis Date Noted  . Von Willebrand disease, type IIa (HCC) 07/29/2016  . Acute blood loss anemia 07/29/2016  . Thrombocytopenia (HCC) 07/29/2016  . Bleeding gums     History reviewed. No pertinent surgical history.  OB History    No data available       Home Medications    Prior to Admission medications   Medication Sig Start Date End Date Taking? Authorizing Provider  desmopressin (DDAVP NASAL) 0.01 % solution Place 2 sprays (20 mcg total) into the nose daily as needed. Nose bleeds 07/29/16   Maryruth Bunhristina P Rama, MD  oseltamivir (TAMIFLU) 75 MG capsule Take 1  capsule (75 mg total) by mouth every 12 (twelve) hours. 12/22/16   Jerelyn ScottMartha Linker, MD  OVER THE COUNTER MEDICATION Take 2 tablets by mouth 3 (three) times daily.    Historical Provider, MD  OVER THE COUNTER MEDICATION 3 tablets daily    Historical Provider, MD  OVER THE COUNTER MEDICATION Take 1 tablet by mouth 2 (two) times daily.    Historical Provider, MD  OVER THE COUNTER MEDICATION Take 1 tablet by mouth 3 (three) times daily.    Historical Provider, MD  penicillin v potassium (VEETID) 500 MG tablet Take 1 tablet (500 mg total) by mouth 3 (three) times daily. 12/22/16 12/29/16  Jerelyn ScottMartha Linker, MD    Family History Family History  Problem Relation Age of Onset  . Leukemia Cousin   . Bleeding Disorder Neg Hx     Social History Social History  Substance Use Topics  . Smoking status: Never Smoker  . Smokeless tobacco: Never Used  . Alcohol use Yes     Comment: occ     Allergies   Sulfa antibiotics   Review of Systems Review of Systems  HENT: Positive for sore throat and trouble swallowing.   Respiratory: Positive for cough.   All other systems reviewed and are negative.    Physical Exam Updated Vital Signs BP 133/85 (BP Location: Right Arm)   Pulse 97   Temp 99.9 F (37.7 C) (Oral)   Resp 20   SpO2 97%   Physical Exam  Constitutional:  She is oriented to person, place, and time. She appears well-developed and well-nourished.  HENT:  Head: Normocephalic and atraumatic.  Mouth/Throat: Oropharynx is clear and moist.  Fullness along the right side of throat and tonsil, uvula is deviated slightly to the left, patient is intermittently spitting out her secretions but is not drooling, has normal phonation without stridor; no tongue or lip swelling  Eyes: Conjunctivae and EOM are normal. Pupils are equal, round, and reactive to light.  Neck: Normal range of motion.  Cardiovascular: Normal rate, regular rhythm and normal heart sounds.   Pulmonary/Chest: Effort normal and  breath sounds normal. No respiratory distress. She has no wheezes. She has no rhonchi.  Abdominal: Soft. Bowel sounds are normal. There is no tenderness. There is no rebound.  Musculoskeletal: Normal range of motion.  Neurological: She is alert and oriented to person, place, and time.  Skin: Skin is warm and dry.  Psychiatric: She has a normal mood and affect.  Nursing note and vitals reviewed.    ED Treatments / Results  Labs (all labs ordered are listed, but only abnormal results are displayed) Labs Reviewed  CBC WITH DIFFERENTIAL/PLATELET - Abnormal; Notable for the following:       Result Value   WBC 18.6 (*)    Platelets 45 (*)    Neutro Abs 15.8 (*)    Monocytes Absolute 1.5 (*)    All other components within normal limits  BASIC METABOLIC PANEL - Abnormal; Notable for the following:    Sodium 131 (*)    Chloride 98 (*)    Glucose, Bld 108 (*)    All other components within normal limits    EKG  EKG Interpretation None       Radiology Dg Chest 2 View  Result Date: 12/22/2016 CLINICAL DATA:  Fever, cough, sore throat, and body aches for the past 3 days. History of von Willebrand's disease. EXAM: CHEST  2 VIEW COMPARISON:  None in PACs FINDINGS: The lungs are adequately inflated. There is no focal infiltrate. There is no pleural effusion. The heart and pulmonary vascularity are normal. The mediastinum is normal in width. There is S-shaped thoracolumbar scoliosis. IMPRESSION: There is no evidence of pneumonia, CHF, nor other acute cardiopulmonary abnormality. Electronically Signed   By: David  [place] M.D.   On: 12/22/2016 16:05    Procedures Procedures (including critical care time)  Medications Ordered in ED Medications  dexamethasone (DECADRON) injection 10 mg (not administered)  clindamycin (CLEOCIN) IVPB 600 mg (not administered)     Initial Impression / Assessment and Plan / ED Course  I have reviewed the triage vital signs and the nursing  notes.  Pertinent labs & imaging results that were available during my care of the patient were reviewed by me and considered in my medical decision making (see chart for details).  66 year old female here with fever worsening sore throat. Seen here yesterday and had chest x-ray, urinalysis, and rapid strep test performed. She was started on penicillin and Tamiflu but has not noticed any improvement. She was seen in follow-up by her PCP today and sent back down to the ED. On exam patient is febrile but she is nontoxic in appearance. On oropharyngeal exam, there is fullness of the right side of the throat and the tonsil and uvula is deviated slightly to the left. Patient is intermittently spitting out her secretions but does not have any drooling. She has normal phonation without stridor. She has no lip or tongue swelling. Concern for peritonsillar  abscess. Will send basic labs and obtain CT soft tissue neck for further evaluation. Patient was given IV Decadron and clindamycin.  2:43 PM Discussed CT results with ENT on call, Dr. Pollyann Kennedy who has reviewed films-- given her platelet count of 45 she is not a candidate for drainage at this time. Agrees with the clindamycin and decadron. Recommends oral clindamycin, FU in ENT clinic next week or sooner if symptoms worsen.  Patient reassessed-- remains without any airway compromise. States she is feeling better after the Decadron. Results and plan discussed with patient, she acknowledged understanding and agreed with plan of care.  Return precautions given for any new/worsening symptoms.  Final Clinical Impressions(s) / ED Diagnoses   Final diagnoses:  Peritonsillar abscess    New Prescriptions New Prescriptions   CLINDAMYCIN (CLEOCIN) 150 MG CAPSULE    Take 2 capsules (300 mg total) by mouth 3 (three) times daily. May dispense as 150mg  capsules     Garlon Hatchet, PA-C 12/23/16 1524    Cathren Laine, MD 12/26/16 [phone]

## 2016-12-23 NOTE — ED Triage Notes (Signed)
C/o prod cough x 5 days-sent from PCP today for possible "throat abscess"-NAD-presents to triage in w/c-drove self to facility

## 2016-12-23 NOTE — Progress Notes (Signed)
Pre visit review using our clinic review tool, if applicable. No additional management support is needed unless otherwise documented below in the visit note. 

## 2016-12-23 NOTE — Patient Instructions (Signed)
I am concerned that you may have an abscess in your throat.  Please go to the ER downstairs for further evaluation- you may need a CT scan to rule out an abscess.  You may also need steroids for the swelling in your uvula. However it will also be important to check your platelet count Your flu test was negative today/

## 2016-12-24 ENCOUNTER — Telehealth: Payer: Self-pay | Admitting: Behavioral Health

## 2016-12-24 ENCOUNTER — Telehealth: Payer: Self-pay | Admitting: Family Medicine

## 2016-12-24 NOTE — Telephone Encounter (Signed)
noted 

## 2016-12-24 NOTE — Telephone Encounter (Signed)
Patient called stating that she started the medication she was prescribed yesterday by Dr. Patsy Lageropland, clindamycin (CLEOCIN) 150 MG capsule and this morning she is smelling garlic, is very flushed and she states she has about a level 5-6 pain level and she feels a lot of pressure in her throat where the abscess is. She is not sure if the symptoms are from an allergic reaction or if they are normal. Sent to team [facility] to be evaluated.

## 2016-12-24 NOTE — Telephone Encounter (Signed)
Patient Name: Meredith Dominguez DOB: 03/14/1951 Initial Comment Caller states she's flushed, tastes a strong taste of garlic, pressure in her throat. Clindymicin. Caller called back saying her tongue is thick and is scared her throat going to close up Nurse Assessment Nurse: Charna Elizabethrumbull, RN, Lynden Angathy Date/Time (Eastern Time): 12/24/2016 1:32:12 PM Confirm and document reason for call. If symptomatic, describe symptoms. ---Caller states she developed swelling of her tongue and difficulty swallowing today. She is having darkness around her lips. Does the patient have any new or worsening symptoms? ---Yes Will a triage be completed? ---Yes Related visit to physician within the last 2 weeks? ---Yes Does the PT have any chronic conditions? (i.e. diabetes, asthma, etc.) ---Unknown Is this a behavioral health or substance abuse call? ---No Guidelines Guideline Title Affirmed Question Affirmed Notes Tongue Swelling Started suddenly after taking a medicine or allergic food (e.g., nuts) Final Disposition User Call EMS 911 Now Charna Elizabethrumbull, RN, [place] Disagree/Comply: Comply

## 2016-12-24 NOTE — Telephone Encounter (Signed)
Called her back- she sounds better today but notes that her throat is still painful. She is not sure if she had any fever.  My schedule is booked but she is going to come in so I can look at her throat this afternoon   Pt came in at 3:45 pm and was put into an exam room- She is taking only her clindamycin- she is not taking any antipyretics at this time. Fever seems to be resolved She is able to breathe ok.  She is having a hard time swallowing due to pain. She has an ENT appt on monday- however I think she needs to be seen sooner. [facility] and get her an appt tomorrow-  She has an appt tomorrow at 2:30; will advise pt  T 98.6 Pulse; 90 BP: 112/70  She looks better today, non toxic GEN: WDWN, NAD, Non-toxic, A & O x 3 HEENT: Atraumatic, Normocephalic. Neck supple. No masses, tender right anterior cervical LAD Oropharynx: inflamed and mildly swollen on the right side but this appears a bit better than yesterday.  However the uvula is more displaced towards the left and is still edematous Ears and Nose: No external deformity. CV: RRR, No M/G/R. No JVD. No thrill. No extra heart sounds. PULM: CTA B, no wheezes, crackles, rhonchi. No retractions. No resp. distress. No accessory muscle use. EXTR: No c/c/e NEURO Normal gait.  PSYCH: Normally interactive. Conversant. Not depressed or anxious appearing.  Calm demeanor.   Here today to recheck a right sided peritonsillar abscess- seen in ER and had a CT scan yesterday.  She is taking her clinda and is not worse. However I do not want her to wait until Monday for her ENT visit- was able to arrange to have her seen tomorrow. She is advised to seek immediate care if not able to breathe normally or if she is feeling worse

## 2016-12-24 NOTE — Telephone Encounter (Signed)
Patient voiced that she continues to have a sore throat/throat pain and was prescribed Clindamycin. She has now taken two doses today and concerned about whether or not the medication is effective. Patient reports that it's still very difficult for her to swallow, stating "there's a lot of pressure in my throat" & "my jaws feel like that have been punched". She has attempted to eat cream of wheat and applesauce, but can't get either foods to go down her throat. The only food that she's able to ingest well at this time is yogurt. Patient denies difficulty breathing, heartburn, stomach pain, vomiting or any other related symptoms. She was last seen here in the office on 12/23/16 with Dr. Patsy Lageropland. Message routed to the provider for review/further recommendations. Advised patient that if symptoms worsen or become more severe to call the office or seek the nearest ED. She verbalized understanding and did not address no further questions or concerns before call ended.

## 2016-12-25 ENCOUNTER — Encounter: Payer: PPO | Admitting: Internal Medicine

## 2016-12-25 DIAGNOSIS — D680 Von Willebrand's disease: Secondary | ICD-10-CM | POA: Diagnosis not present

## 2016-12-25 DIAGNOSIS — R1310 Dysphagia, unspecified: Secondary | ICD-10-CM | POA: Diagnosis not present

## 2016-12-25 DIAGNOSIS — J36 Peritonsillar abscess: Secondary | ICD-10-CM | POA: Diagnosis not present

## 2016-12-25 LAB — CULTURE, GROUP A STREP (THRC)

## 2016-12-28 ENCOUNTER — Ambulatory Visit (INDEPENDENT_AMBULATORY_CARE_PROVIDER_SITE_OTHER): Payer: PPO | Admitting: Family Medicine

## 2016-12-28 ENCOUNTER — Encounter: Payer: Self-pay | Admitting: Family Medicine

## 2016-12-28 VITALS — BP 122/79 | HR 98 | Temp 97.5°F | Ht 67.0 in | Wt 141.4 lb

## 2016-12-28 DIAGNOSIS — D6800 Von Willebrand disease, unspecified: Secondary | ICD-10-CM

## 2016-12-28 DIAGNOSIS — J029 Acute pharyngitis, unspecified: Secondary | ICD-10-CM | POA: Diagnosis not present

## 2016-12-28 NOTE — Progress Notes (Signed)
[facility] [place] [facility] [place] [address], [address] [place], [place] [address] [phone] [phone]  Date:  12/28/2016   Name:  Meredith ArisMarilyn Benish   DOB:  05/14/1951   MRN:  [id number]  PCP:  Kathyrn LassJones, Champ M, MD    Chief Complaint: Follow-up (Pt here for f/u visit. Still having pressure in right ear. Pt is able to swallow now and does feel better since last visit. Still taking Clindamycin which pt feels has helped. Declined flu vaccine. )   History of Present Illness:  Meredith Dominguez is a 66 y.o. very pleasant female patient who presents with the following:  History of Von Willebrand disease.  Seen last week with a left sided peri-tonsillar abscess. She saw ENT on Friday- they did not do an I and D due to her platelet issue  She is now able to eat- she started being able to swallow again 2 days ago  She is still taking her clindamycin- she is tolerating this ok  She plans to RTW next week  Her temp is normal today, normal last night The day prior she was at 99.1 Her energy level is still not great, but improving  She did have to use her DDAVP spray in her nose a couple of days ago for some bleeding from the abscess area- this resolved the bleeding and no further bleeding  Patient Active Problem List   Diagnosis Date Noted  . Von Willebrand disease, type IIa (HCC) 07/29/2016  . Acute blood loss anemia 07/29/2016  . Thrombocytopenia (HCC) 07/29/2016  . Bleeding gums     Past Medical History:  Diagnosis Date  . Von Willebrand disease (HCC)     No past surgical history on file.  Social History  Substance Use Topics  . Smoking status: Never Smoker  . Smokeless tobacco: Never Used  . Alcohol use Yes     Comment: occ    Family History  Problem Relation Age of Onset  . Leukemia Cousin   . Bleeding Disorder Neg Hx     Allergies  Allergen Reactions  . Sulfa Antibiotics Rash    Medication list has been reviewed and updated.  Current  Outpatient Prescriptions on File Prior to Visit  Medication Sig Dispense Refill  . clindamycin (CLEOCIN) 150 MG capsule Take 2 capsules (300 mg total) by mouth 3 (three) times daily. May dispense as 150mg  capsules 60 capsule 0  . desmopressin (DDAVP NASAL) 0.01 % solution Place 2 sprays (20 mcg total) into the nose daily as needed. Nose bleeds 5 mL 12  . OVER THE COUNTER MEDICATION Take 2 tablets by mouth 3 (three) times daily.    Marland Kitchen. OVER THE COUNTER MEDICATION 3 tablets daily    . OVER THE COUNTER MEDICATION Take 1 tablet by mouth 2 (two) times daily.    Marland Kitchen. OVER THE COUNTER MEDICATION Take 1 tablet by mouth 3 (three) times daily.     No current facility-administered medications on file prior to visit.     Review of Systems:  As per HPI- otherwise negative.   Physical Examination: Vitals:   12/28/16 1028  BP: 122/79  Pulse: 98  Temp: 97.5 F (36.4 C)   Vitals:   12/28/16 1028  Weight: 141 lb 6.4 oz (64.1 kg)  Height: 5\' 7"  (1.702 m)   Body mass index is 22.15 kg/m. Ideal Body Weight: Weight in (lb) to have BMI = 25: 159.3  GEN: WDWN, NAD, Non-toxic, A & O x  3, she looks much better HEENT: Atraumatic, Normocephalic. Neck supple. No masses, No LAD. Bilateral TM wnl, oropharynx: uvula is no longer edematous. It is just slightly displaced to the left now and the swelling about he right tonsil is much improved. No exudate, minimal redness of right oropharynx  PEERL,EOMI.   Ears and Nose: No external deformity. CV: RRR, No M/G/R. No JVD. No thrill. No extra heart sounds. PULM: CTA B, no wheezes, crackles, rhonchi. No retractions. No resp. distress. No accessory muscle use. ABD: S, NT, ND, +BS. EXTR: No c/c/e NEURO Normal gait.  PSYCH: Normally interactive. Conversant. Not depressed or anxious appearing.  Calm demeanor.    Assessment and Plan: Acute pharyngitis, unspecified etiology  Von Willebrand's disease (HCC)  Improving from her right sided PTI- continue clindamycin, she  is getting better She will come and see me or otherwise seek care if not continuing to get better  Signed Abbe Amsterdam, MD

## 2016-12-28 NOTE — Patient Instructions (Addendum)
It is great to see you doing better- I am hoping that you will continue to improve over the next week or so.  Continue to take your antibiotics as directed. If you are not continuing to get better please let me know or otherwise seek care.  If you are not already, please start taking a probiotic to prevent diarrhea with the clindamycin antibiotic

## 2016-12-30 ENCOUNTER — Telehealth: Payer: Self-pay | Admitting: Family Medicine

## 2016-12-30 NOTE — Telephone Encounter (Signed)
Pt called in she said that she was treated by Dr. Patsy Lageropland for cold like symptoms. She said that she has about a day or so left on her meds but still has a low grade fever (99.3). She would like to be advised by provider further.    CB: [phone]

## 2016-12-31 NOTE — Telephone Encounter (Signed)
Called pt back and discussed with her. She is continuing to improve, no fever, her pain is nearly gone.  Advised her that as long as she is getting back to her normal state of health she does not need to come in for another recheck but she will seek care right away if worsening again

## 2016-12-31 NOTE — Telephone Encounter (Signed)
Spoke to pt who states that she's getting better. Will finish antibiotic tomorrow. Still having some pressure in ear. Mostly on right and little on the left. Temp has ranged from 97.2-98.1. Pt would like to know if she will need a follow up visit in a few days to make sure   Pt states that she no longer notices any more blood in mucus when coughing. Please advise.

## 2017-02-09 ENCOUNTER — Telehealth: Payer: Self-pay

## 2017-02-09 NOTE — Telephone Encounter (Signed)
Pre visit call completed 

## 2017-02-10 ENCOUNTER — Ambulatory Visit (INDEPENDENT_AMBULATORY_CARE_PROVIDER_SITE_OTHER): Payer: PPO | Admitting: Family Medicine

## 2017-02-10 VITALS — BP 102/78 | HR 73 | Temp 97.5°F | Ht 67.0 in | Wt 146.6 lb

## 2017-02-10 DIAGNOSIS — Z8639 Personal history of other endocrine, nutritional and metabolic disease: Secondary | ICD-10-CM | POA: Diagnosis not present

## 2017-02-10 DIAGNOSIS — Z0000 Encounter for general adult medical examination without abnormal findings: Secondary | ICD-10-CM

## 2017-02-10 DIAGNOSIS — Z13220 Encounter for screening for lipoid disorders: Secondary | ICD-10-CM

## 2017-02-10 DIAGNOSIS — D6800 Von Willebrand disease, unspecified: Secondary | ICD-10-CM

## 2017-02-10 DIAGNOSIS — R8271 Bacteriuria: Secondary | ICD-10-CM | POA: Diagnosis not present

## 2017-02-10 DIAGNOSIS — R350 Frequency of micturition: Secondary | ICD-10-CM

## 2017-02-10 DIAGNOSIS — Z131 Encounter for screening for diabetes mellitus: Secondary | ICD-10-CM | POA: Diagnosis not present

## 2017-02-10 DIAGNOSIS — Z1159 Encounter for screening for other viral diseases: Secondary | ICD-10-CM | POA: Diagnosis not present

## 2017-02-10 LAB — COMPREHENSIVE METABOLIC PANEL
ALT: 12 U/L (ref 0–35)
AST: 18 U/L (ref 0–37)
Albumin: 4.2 g/dL (ref 3.5–5.2)
Alkaline Phosphatase: 52 U/L (ref 39–117)
BUN: 23 mg/dL (ref 6–23)
CALCIUM: 9.7 mg/dL (ref 8.4–10.5)
CHLORIDE: 103 meq/L (ref 96–112)
CO2: 28 meq/L (ref 19–32)
CREATININE: 0.9 mg/dL (ref 0.40–1.20)
GFR: 66.17 mL/min (ref >60.00)
Glucose, Bld: 77 mg/dL (ref 70–99)
Potassium: 4 mEq/L (ref 3.5–5.1)
Sodium: 138 mEq/L (ref 135–145)
Total Bilirubin: 0.6 mg/dL (ref 0.2–1.2)
Total Protein: 7.3 g/dL (ref 6.0–8.3)

## 2017-02-10 LAB — POC URINALSYSI DIPSTICK (AUTOMATED)
Bilirubin, UA: NEGATIVE
Glucose, UA: NEGATIVE
Ketones, UA: NEGATIVE
Leukocytes, UA: NEGATIVE
NITRITE UA: NEGATIVE
PH UA: 6.5 (ref 5.0–8.0)
PROTEIN UA: NEGATIVE
SPEC GRAV UA: 1.015 (ref 1.030–1.035)
UROBILINOGEN UA: NEGATIVE (ref ?–2.0)

## 2017-02-10 LAB — CBC WITH DIFFERENTIAL/PLATELET
BASOS PCT: 0.7 % (ref 0.0–3.0)
Basophils Absolute: 0 10*3/uL (ref 0.0–0.1)
EOS ABS: 0.1 10*3/uL (ref 0.0–0.7)
Eosinophils Relative: 1.6 % (ref 0.0–5.0)
HEMATOCRIT: 41.4 % (ref 36.0–46.0)
Hemoglobin: 13.7 g/dL (ref 12.0–15.0)
LYMPHS ABS: 2.3 10*3/uL (ref 0.7–4.0)
LYMPHS PCT: 32.7 % (ref 12.0–46.0)
MCHC: 33.1 g/dL (ref 30.0–36.0)
MCV: 87.6 fl (ref 78.0–100.0)
MONO ABS: 0.7 10*3/uL (ref 0.1–1.0)
Monocytes Relative: 9.4 % (ref 3.0–12.0)
NEUTROS ABS: 3.9 10*3/uL (ref 1.4–7.7)
Neutrophils Relative %: 55.6 % (ref 43.0–77.0)
PLATELETS: 81 10*3/uL — AB (ref 150.0–400.0)
RBC: 4.73 Mil/uL (ref 3.87–5.11)
RDW: 14.3 % (ref 11.5–15.5)
WBC: 7.1 10*3/uL (ref 4.0–10.5)

## 2017-02-10 LAB — LIPID PANEL
CHOL/HDL RATIO: 3
Cholesterol: 210 mg/dL — ABNORMAL HIGH (ref 0–200)
HDL: 76.1 mg/dL (ref >39.00)
LDL CALC: 116 mg/dL — AB (ref 0–99)
NonHDL: 134.25
TRIGLYCERIDES: 93 mg/dL (ref 0.0–149.0)
VLDL: 18.6 mg/dL (ref 0.0–40.0)

## 2017-02-10 LAB — TSH: TSH: 1.85 u[IU]/mL (ref 0.35–4.50)

## 2017-02-10 LAB — URINALYSIS, MICROSCOPIC ONLY

## 2017-02-10 NOTE — Progress Notes (Signed)
[facility] [place] [address], [address] [place], [place] [address] [phone] [phone]  Date:  02/10/2017   Name:  Meredith ArisMarilyn Sigley   DOB:  06/03/1951   MRN:  [id number]  PCP:  Abbe AmsterdamOPLAND,JESSICA, MD    Chief Complaint: Establish Care (Pt here to est care. Had recent UTI finished treatement of Utract supplement and would like to have urine retested today. c/o eyes feeling "scratchy" )   History of Present Illness:  Meredith Dominguez is a 66 y.o. very pleasant female patient who presents with the following: Pt states that she had thought she was here for a CPE, will provide for her today Here today with concern of possible UTI- she was apparently told that she had bacteria in her urine about a month ago by a ?naturopath and was given a supplement of some sort.  She did not take an antibiotic.  It does not sound like she had a culture. She would like to be sure that her urine is now clear.   She describes having testing done to screen her for parasitic, bacterial and viral infection and to find out what "% function" her organs have.  She is then given various herbs and other treatments to correct these "infections."   At this time she is not having any urinary sx such as frequency or hematuria  Asked her breast cancer screening- states that she is having "thermogram" as breast cancer screening which was apparently normal.   She has not had a mammogram in several years   She refused to have a colon cancer screening due to risk of bleeding- she has noted some rectal bleeding however- BRB- and thinks it must be due to hemorrhoids.  She also notes some vaginal dryness at times.  Discussed and encouraged cologuard for her but she is not sure she wants to have this done   Her last pap was about 18 months ago- it was normal and she has never had any abnl in the past.    She sees an "intuitive healer" who she feels is able to determine her health needs simply  through intuition, and who has suggested various supplements and juices   She does not get immunizations as she states that she is never sure how she will react to them    Patient Active Problem List   Diagnosis Date Noted  . Von Willebrand disease, type IIa (HCC) 07/29/2016  . Acute blood loss anemia 07/29/2016  . Thrombocytopenia (HCC) 07/29/2016  . Bleeding gums     Past Medical History:  Diagnosis Date  . Anemia   . Von Willebrand disease (HCC)     Past Surgical History:  Procedure Laterality Date  . BREAST SURGERY     Breast Biopsy Left x2    Social History  Substance Use Topics  . Smoking status: Never Smoker  . Smokeless tobacco: Never Used  . Alcohol use Yes     Comment: occ    Family History  Problem Relation Age of Onset  . Leukemia Cousin   . Cancer Mother   . Diabetes Father   . Heart disease Father   . Bleeding Disorder Neg Hx     Allergies  Allergen Reactions  . Gluten Meal Other (See Comments)    sensitivity  . Monosodium Glutamate Other (See Comments)    Hot and feels bad  . Sulfa Antibiotics Rash    Medication list has been reviewed and updated.  Current Outpatient Prescriptions on File Prior to Visit  Medication Sig Dispense Refill  . desmopressin (DDAVP NASAL) 0.01 % solution Place 2 sprays (20 mcg total) into the nose daily as needed. Nose bleeds 5 mL 12  . OVER THE COUNTER MEDICATION Take 2 tablets by mouth 3 (three) times daily.    Marland Kitchen OVER THE COUNTER MEDICATION 3 tablets daily    . OVER THE COUNTER MEDICATION Take 1 tablet by mouth 2 (two) times daily.    Marland Kitchen OVER THE COUNTER MEDICATION Take 1 tablet by mouth 3 (three) times daily.     No current facility-administered medications on file prior to visit.     Review of Systems:  As per HPI- otherwise negative.   Physical Examination: Vitals:   02/10/17 1057  BP: 102/78  Pulse: 73  Temp: 97.5 F (36.4 C)   Vitals:   02/10/17 1057  Weight: 146 lb 9.6 oz (66.5 kg)  Height:  5\' 7"  (1.702 m)   Body mass index is 22.96 kg/m. Ideal Body Weight: Weight in (lb) to have BMI = 25: 159.3  GEN: WDWN, NAD, Non-toxic, A & O x 3, normal weight, looks well HEENT: Atraumatic, Normocephalic. Neck supple. No masses, No LAD.  Bilateral TM wnl, oropharynx normal.  PEERL,EOMI.   Ears and Nose: No external deformity. CV: RRR, No M/G/R. No JVD. No thrill. No extra heart sounds. PULM: CTA B, no wheezes, crackles, rhonchi. No retractions. No resp. distress. No accessory muscle use. ABD: S, NT, ND, +BS. No rebound. No HSM. EXTR: No c/c/e NEURO Normal gait.  PSYCH: Normally interactive. Conversant. Not depressed or anxious appearing.  Calm demeanor.  Breast: normal exam, no masses/ dimpling/ discharge Pelvic: normal, no vaginal lesions or discharge. Uterus normal, no CMT, no adnexal tendereness or masses Anus is normal without any apparent fissures or external hemorrhoids    Assessment and Plan: Physical exam  Bacteria in urine - Plan: Urine culture, Urine Microscopic Only  Urine frequency - Plan: POCT Urinalysis Dipstick (Automated)  Von Willebrand disease (HCC) - Plan: CBC with Differential/Platelet, Ambulatory referral to Hematology  Screening for diabetes mellitus - Plan: Comprehensive metabolic panel  Screening for hyperlipidemia - Plan: Lipid panel  History of thyroid disease - Plan: TSH  Encounter for hepatitis C screening test for low risk patient - Plan: Hepatitis C antibody  Here today for a CPE Labs pending as above Urine dip today is clear except for trace blood.  Will obtain a micro and culture for her Other screening labs as above Explained to her that breast thermography is NOT an accepted method for breast cancer screening. I feel that she is being taken advantage of by the practice that is selling her this service (for which she admits she pays out of pocket).  Otherwise I don't think the intuitive healer or other somewhat unusual health practices she is  following are necessarily harmful and did not try to talk her out of these.    She does not have a hematologist locally and would like a referral - will do for her today  Results for orders placed or performed in visit on 02/10/17  Urine Microscopic Only  Result Value Ref Range   RBC / HPF 0-2/hpf 0-2/hpf   Squamous Epithelial / LPF Rare(0-4/hpf) Rare(0-4/hpf)  CBC with Differential/Platelet  Result Value Ref Range   WBC 7.1 4.0 - 10.5 K/uL   RBC 4.73 3.87 - 5.11 Mil/uL   Hemoglobin 13.7 12.0 - 15.0 g/dL   HCT 16.1 09.6 -  46.0 %   MCV 87.6 78.0 - 100.0 fl   MCHC 33.1 30.0 - 36.0 g/dL   RDW 16.1 09.6 - 04.5 %   Platelets 81.0 (L) 150.0 - 400.0 K/uL   Neutrophils Relative % 55.6 43.0 - 77.0 %   Lymphocytes Relative 32.7 12.0 - 46.0 %   Monocytes Relative 9.4 3.0 - 12.0 %   Eosinophils Relative 1.6 0.0 - 5.0 %   Basophils Relative 0.7 0.0 - 3.0 %   Neutro Abs 3.9 1.4 - 7.7 K/uL   Lymphs Abs 2.3 0.7 - 4.0 K/uL   Monocytes Absolute 0.7 0.1 - 1.0 K/uL   Eosinophils Absolute 0.1 0.0 - 0.7 K/uL   Basophils Absolute 0.0 0.0 - 0.1 K/uL  Comprehensive metabolic panel  Result Value Ref Range   Sodium 138 135 - 145 mEq/L   Potassium 4.0 3.5 - 5.1 mEq/L   Chloride 103 96 - 112 mEq/L   CO2 28 19 - 32 mEq/L   Glucose, Bld 77 70 - 99 mg/dL   BUN 23 6 - 23 mg/dL   Creatinine, Ser 4.09 0.40 - 1.20 mg/dL   Total Bilirubin 0.6 0.2 - 1.2 mg/dL   Alkaline Phosphatase 52 39 - 117 U/L   AST 18 0 - 37 U/L   ALT 12 0 - 35 U/L   Total Protein 7.3 6.0 - 8.3 g/dL   Albumin 4.2 3.5 - 5.2 g/dL   Calcium 9.7 8.4 - 81.1 mg/dL   GFR 91.47 >82.95 mL/min  Lipid panel  Result Value Ref Range   Cholesterol 210 (H) 0 - 200 mg/dL   Triglycerides 62.1 0.0 - 149.0 mg/dL   HDL 30.86 >57.84 mg/dL   VLDL 69.6 0.0 - 29.5 mg/dL   LDL Cholesterol 284 (H) 0 - 99 mg/dL   Total CHOL/HDL Ratio 3    NonHDL 134.25   TSH  Result Value Ref Range   TSH 1.85 0.35 - 4.50 uIU/mL  POCT Urinalysis Dipstick (Automated)   Result Value Ref Range   Color, UA yellow    Clarity, UA clear    Glucose, UA negative    Bilirubin, UA negative    Ketones, UA negative    Spec Grav, UA 1.015 1.030 - 1.035   Blood, UA trace    pH, UA 6.5 5.0 - 8.0   Protein, UA negative    Urobilinogen, UA negative Negative - 2.0   Nitrite, UA negative    Leukocytes, UA Negative Negative   0-2 red cells per field- ok.  Letter to pt pending urine culture   Signed Abbe Amsterdam, MD

## 2017-02-10 NOTE — Progress Notes (Signed)
Pre visit review using our clinic review tool, if applicable. No additional management support is needed unless otherwise documented below in the visit note. [email or web address]

## 2017-02-10 NOTE — Patient Instructions (Signed)
Per UpToDate database:  Thermography-The use of thermography to detect occult breast cancer was based on the observation that patients have elevated breast skin temperatures over their breast cancers [44]. It was first investigated for screening in the Breast Cancer Detection Demonstration Project in the 1970s and was found to have poor test characteristics, with a false-positive rate of 25 percent and a false-negative rate of more than 60 percent [45]. In 2004, a breast thermography device received approval from the US Food and Drug Administration (FDA) on the basis of prior approval for infrared imaging technology because of demonstrated safety but not necessarily efficacy [44]. The specificity of thermography remains very low, even with modern equipment [44]. Despite this, a number of thermography centers have been established in several [place] cities. No major organization making screening recommendations recommends thermography. Of those commenting on it, the American Cancer Society states, "No study has ever shown that it is an effective screening tool for finding breast cancer early" [46], and the [employer] specifically states it does not endorse thermography for detecting clinically occult breast cancer [47]. The [place] FDA issued a safety communication in June 2011 notifying consumers that thermography is not a replacement for screening mammography and that thermography on its own is not an effective screening tool  I have also included a notice from the FDA about thermotherapy.  I understand your concerns about radiation and that mammography is not a perfect test but there is no evidence to recommend thermography and it may be giving you a false sense of security in addition to wasting your money and time.   We will obtain labs for you today and I will be in touch with your results asap I think we should at least consider colon cancer screening for you of some type, but  understand your hesitation to consider a colonoscopy.  Cologuard stool testing may be a good way to give us some reassurance that all is well in your colon

## 2017-02-11 ENCOUNTER — Encounter: Payer: Self-pay | Admitting: Family Medicine

## 2017-02-11 LAB — HEPATITIS C ANTIBODY: HCV AB: NEGATIVE

## 2017-02-11 LAB — URINE CULTURE

## 2017-02-16 ENCOUNTER — Telehealth: Payer: Self-pay | Admitting: Family Medicine

## 2017-02-16 NOTE — Telephone Encounter (Signed)
Pt will call back to schedule AWV °

## 2017-02-17 ENCOUNTER — Telehealth: Payer: Self-pay | Admitting: Family Medicine

## 2017-02-17 NOTE — Telephone Encounter (Signed)
Pt called in to request her lab results.    CB: [phone]

## 2017-02-17 NOTE — Telephone Encounter (Signed)
Patient notified

## 2017-02-17 NOTE — Telephone Encounter (Signed)
Please give her a call 

## 2017-03-29 ENCOUNTER — Ambulatory Visit: Payer: PPO | Admitting: Hematology & Oncology

## 2017-03-29 ENCOUNTER — Ambulatory Visit: Payer: PPO

## 2017-03-29 ENCOUNTER — Other Ambulatory Visit: Payer: PPO

## 2017-04-01 NOTE — Telephone Encounter (Signed)
Scheduled 04/28/17 °

## 2017-04-06 ENCOUNTER — Ambulatory Visit: Payer: PPO

## 2017-04-06 ENCOUNTER — Other Ambulatory Visit (HOSPITAL_BASED_OUTPATIENT_CLINIC_OR_DEPARTMENT_OTHER): Payer: PPO

## 2017-04-06 ENCOUNTER — Ambulatory Visit (HOSPITAL_BASED_OUTPATIENT_CLINIC_OR_DEPARTMENT_OTHER): Payer: PPO | Admitting: Hematology & Oncology

## 2017-04-06 VITALS — BP 116/60 | HR 61 | Temp 98.1°F | Resp 18 | Wt 146.4 lb

## 2017-04-06 DIAGNOSIS — D68020 Von Willebrand disease, type 2a: Secondary | ICD-10-CM

## 2017-04-06 DIAGNOSIS — D696 Thrombocytopenia, unspecified: Secondary | ICD-10-CM

## 2017-04-06 DIAGNOSIS — D680 Von Willebrand's disease: Secondary | ICD-10-CM

## 2017-04-06 LAB — CBC WITH DIFFERENTIAL (CANCER CENTER ONLY)
BASO#: 0 10*3/uL (ref 0.0–0.2)
BASO%: 0.4 % (ref 0.0–2.0)
EOS ABS: 0.2 10*3/uL (ref 0.0–0.5)
EOS%: 2.1 % (ref 0.0–7.0)
HEMATOCRIT: 40.7 % (ref 34.8–46.6)
HEMOGLOBIN: 13.7 g/dL (ref 11.6–15.9)
LYMPH#: 2.3 10*3/uL (ref 0.9–3.3)
LYMPH%: 29.1 % (ref 14.0–48.0)
MCH: 29.7 pg (ref 26.0–34.0)
MCHC: 33.7 g/dL (ref 32.0–36.0)
MCV: 88 fL (ref 81–101)
MONO#: 0.7 10*3/uL (ref 0.1–0.9)
MONO%: 9.2 % (ref 0.0–13.0)
NEUT%: 59.2 % (ref 39.6–80.0)
NEUTROS ABS: 4.7 10*3/uL (ref 1.5–6.5)
Platelets: 88 10*3/uL — ABNORMAL LOW (ref 145–400)
RBC: 4.61 10*6/uL (ref 3.70–5.32)
RDW: 13.6 % (ref 11.1–15.7)
WBC: 8 10*3/uL (ref 3.9–10.0)

## 2017-04-06 LAB — CHCC SATELLITE - SMEAR

## 2017-04-06 NOTE — Progress Notes (Signed)
Referral MD  Reason for Referral: Von Willebrand disease-type 2A thrombocytopenia-etiology unclear   Chief Complaint  Patient presents with  . New Patient (Initial Visit)  : I need someone to follow my von Willebrand disease.  HPI: Meredith Dominguez is a very nice 66 year old white female. She asked is a friend of one of my patients, Sheran Lawless.  Meredith Dominguez has von Willebrand disease. She has seen a hematologist previously. She saw Dr. Bridgett Larsson of no vomiting. He is a very good hematologist and very thorough.  He, states in his notes, that she has type 2A von Willebrand disease. Specifically, type 2A von Willebrand disease has a decrease in high and intermediate weight multimers. Thrombocytopenia typically is not present. We are checking her von Willebrand levels.  She is worried about having a dental procedure. She needs dental cleaning. She last had dental pain I think back in the fall of last year. She typically gets DDAVP before the procedure and afterwards. However she did provide did not have any DDAVP and had bleeding.  She has several bruises on her body. She avoids aspirin, nonsteroidals, garlic, etc. She is very well versed in natural supplements.  She really has not had any surgery before.  She is not sure who in the family has von Willebrand disease. She thinks it might be from her father's side of the family.  I'm not sure as to why she has the thrombocytopenia. She has not had a formal workup for this.  She is now divorced. This has been quite stressful for her. 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 This also is stressful.  She thinks that stress is causing her problems with the platelets.  Going back to her old labs, a PTT back in January was 27 seconds. Her von Willebrand activity was only 18%. She had a normal factor VIII level of 75%. Her actual von Willebrand factor was 17%.  I went through her old records. I don't think she is ever received factor VIII  replacement with Humate -P.  She doesn't bleed spontaneously. She may have some nosebleeds. She does have bruises. There is no hematuria. She has no melena or bright red blood per rectum. She's never had a colonoscopy.  She does not have mammograms. She has breast thermography.  She is not a vegetarian. She has had no weight loss or weight gain.  There is been no nausea or vomiting.  Overall, her performance status is ECOG 0.     Past Medical History:  Diagnosis Date  . Anemia   . Von Willebrand disease ([place])   :  Past Surgical History:  Procedure Laterality Date  . BREAST SURGERY     Breast Biopsy Left x2  :   Current Outpatient Prescriptions:  .  Chlorophyllin Copper Cmplx Sod POWD, Take by mouth., Disp: , Rfl:  .  Cholecalciferol (VITAMIN D3) 100000 UNIT/GM POWD, 2 drops per day, Disp: , Rfl:  .  desmopressin (DDAVP NASAL) 0.01 % solution, Place 2 sprays (20 mcg total) into the nose daily as needed. Nose bleeds, Disp: 5 mL, Rfl: 12 .  desmopressin (DDAVP) 0.01 % SOLN, , Disp: , Rfl: 10 .  OVER THE COUNTER MEDICATION, Take 1 tablet by mouth 2 (two) times daily., Disp: , Rfl: :  :  Allergies  Allergen Reactions  . Gluten Meal Other (See Comments)    sensitivity  . Monosodium Glutamate Other (See Comments)    Hot and feels bad  . Sulfa Antibiotics Rash  :  Family History  Problem Relation Age of Onset  . Leukemia Cousin   . Cancer Mother   . Diabetes Father   . Heart disease Father   . Bleeding Disorder Neg Hx   :  Social History   Social History  . Marital status: Divorced    Spouse name: N/A  . Number of children: N/A  . Years of education: N/A   Occupational History  . Not on file.   Social History Main Topics  . Smoking status: Never Smoker  . Smokeless tobacco: Never Used  . Alcohol use Yes     Comment: occ  . Drug use: No  . Sexual activity: Not on file   Other Topics Concern  . Not on file   Social History Narrative  . No narrative  on file  :  Pertinent items are noted in HPI.  Exam:Well-developed well-nourished white female in no obvious distress. Her vital signs show a temperature of 98.1. Pulse 61. Blood pressure 116/60. Weight is 146 pounds. Head and neck exam shows no ocular or oral lesions. She has no palatal petechia. There is no ecchymosis within the oral cavity. There is no adenopathy on her neck. Lungs are clear. Cardiac exam shows a regular rate and rhythm with a normal S1 and S2. There are no murmurs, rubs or bruits. Abdomen is soft. She has good bowel sounds. There is no fluid wave. There is no palpable liver or spleen tip. Back exam shows no tenderness over the spine, ribs or hips. Extremities shows no clubbing, cyanosis or edema. Neurological exam shows no focal neurological deficits. Skin exam does show some scattered ecchymoses.     Recent Labs  04/06/17 1353  WBC 8.0  HGB 13.7  HCT 40.7  PLT 88 Platelet count consistent in citrate*   No results for input(s): NA, K, CL, CO2, GLUCOSE, BUN, CREATININE, CALCIUM in the last 72 hours.  Blood smear review:  Normochromic and normocytic population of red blood cells. She has no nucleated red blood cells. Her platelets are adequate in size. Platelets are well granulated. White cells were normal in morphology and maturation.  Pathology: None     Assessment and Plan:  Meredith Dominguez is a 66 year old white female. She has von Willebrand disease. It sounds like from all of her notes, that she has type 2A disease.  We did send off some von Willebrand's levels. I will have to make sure that we send off a Ristocetin cofactor activity. I also need to make sure send off a RIPA.  I'm still not sure why she has the thrombocytopenia. This appears to be totally unrelated. However, I know that with type 2B while Willebrand, that thrombocytopenia can be a factor. This may be where the RIPA is helpful. With type 2B, the RIPA is actually increased. Whereas with type 2A, the  RIPA is decreased.  Apparently, the dentist will not do any dental work on her unless we approve it. It sounds like DDAVP has worked for her in the past. Again I am not sure what is going on with the platelets.  I will order an ultrasound of her abdomen to look for splenomegaly or any hepatic issues.  I don't think we have to do a bone marrow biopsy but this might persona to think about down the road. Once we get her von Willebrand levels back, then we will see about getting her back in.  I spent about 45 minutes with her. I answered all of her questions.

## 2017-04-07 ENCOUNTER — Ambulatory Visit (HOSPITAL_BASED_OUTPATIENT_CLINIC_OR_DEPARTMENT_OTHER)
Admission: RE | Admit: 2017-04-07 | Discharge: 2017-04-07 | Disposition: A | Discharge status: Home / Self Care | Payer: PPO | Source: Ambulatory Visit | Attending: Hematology & Oncology | Admitting: Hematology & Oncology

## 2017-04-07 DIAGNOSIS — D680 Von Willebrand's disease: Secondary | ICD-10-CM | POA: Diagnosis not present

## 2017-04-07 DIAGNOSIS — D68020 Von Willebrand disease, type 2a: Secondary | ICD-10-CM

## 2017-04-07 LAB — APTT: aPTT: 29 s (ref 24–33)

## 2017-04-08 ENCOUNTER — Ambulatory Visit (HOSPITAL_BASED_OUTPATIENT_CLINIC_OR_DEPARTMENT_OTHER): Payer: PPO

## 2017-04-09 ENCOUNTER — Telehealth: Payer: Self-pay | Admitting: *Deleted

## 2017-04-09 NOTE — Telephone Encounter (Addendum)
Patient is aware of results  ----- Message from Josph MachoPeter R Ennever, MD sent at 04/09/2017 12:55 PM EDT ----- Call -the U/S is normal!!  The liver and spleen and gallbladder are normal!!!  pete

## 2017-04-11 LAB — VON WILLEBRAND PANEL
FACTOR VIII ACTIVITY: 55 % — AB (ref 57–163)
VON WILLEBRAND AG: 75 % (ref 50–200)

## 2017-04-21 ENCOUNTER — Telehealth: Payer: Self-pay | Admitting: Hematology & Oncology

## 2017-04-21 ENCOUNTER — Other Ambulatory Visit: Payer: Self-pay | Admitting: Hematology & Oncology

## 2017-04-21 NOTE — Telephone Encounter (Signed)
Per Md order to cx 05/06/17 apt and resch patient 2 weeks out.  Apt was sch for 05/21/17.  I left message on voicemail of the changed apt date/time.  I also mailed out an apt calendar

## 2017-04-27 NOTE — Progress Notes (Addendum)
Subjective:   Meredith Dominguez is a 66 y.o. female who presents for an Initial Medicare Annual Wellness Visit.  The Patient was informed that the wellness visit is to identify future health risk and educate and initiate measures that can reduce risk for increased disease through the lifespan.    Review of Systems    No ROS.  Medicare Wellness Visit.  Cardiac Risk Factors include: advanced age (>47mn, >>53women) Sleep patterns: Sleeps about 8 hrs per night. Feels rested. Home Safety/Smoke Alarms: Feels safe in home. Smoke alarms in place.  Living environment; residence and Firearm Safety: Lives alone. No stairs.  Seat Belt Safety/Bike Helmet: Wears seat belt.   Counseling:   Eye Exam-  Wearing glasses. Trifocals.  Visits eye doctor every 2 years. Dental- Dr.Jentry as needed   Female:   Pap-   Pt unsure. States she will discuss with PCP.    Mammo- Pt declines.      Dexa scan- pt declines       CCS- Pt declines     Objective:    Today's Vitals   04/28/17 1624  BP: 108/68  Pulse: 61  Temp: 97.6 F (36.4 C)  SpO2: 99%  Weight: 147 lb 11.2 oz (67 kg)  Height: '5\' 7"'  (1.702 m)   Body mass index is 23.13 kg/m.   Current Medications (verified) Outpatient Encounter Prescriptions as of 04/28/2017  Medication Sig  . Chlorophyllin Copper Cmplx Sod POWD Take by mouth.  . Cholecalciferol (VITAMIN D3) 100000 UNIT/GM POWD 2 drops per day  . desmopressin (DDAVP NASAL) 0.01 % solution Place 2 sprays (20 mcg total) into the nose daily as needed. Nose bleeds  . desmopressin (DDAVP) 0.01 % SOLN   . OVER THE COUNTER MEDICATION Take 1 tablet by mouth 2 (two) times daily.   No facility-administered encounter medications on file as of 04/28/2017.     Allergies (verified) Gluten meal; Monosodium glutamate; and Sulfa antibiotics   History: Past Medical History:  Diagnosis Date  . Anemia   . [place]    Past Surgical History:  Procedure Laterality Date  .  BREAST SURGERY     Breast Biopsy Left x2   Family History  Problem Relation Age of Onset  . Leukemia Cousin   . Cancer Mother   . Diabetes Father   . Heart disease Father   . Bleeding Disorder Neg Hx    Social History   Occupational History  . Not on file.   Social History Main Topics  . Smoking status: Never Smoker  . Smokeless tobacco: Never Used  . Alcohol use Yes     Comment: occ  . Drug use: No  . Sexual activity: No    Tobacco Counseling Counseling given: Not Answered   Activities of Daily Living In your present state of health, do you have any difficulty performing the following activities: 04/28/2017 07/29/2016  Hearing? N N  Vision? N N  Difficulty concentrating or making decisions? N N  Walking or climbing stairs? N N  Dressing or bathing? N N  Doing errands, shopping? N N  Preparing Food and eating ? N -  Using the Toilet? N -  In the past six months, have you accidently leaked urine? N -  Do you have problems with loss of bowel control? N -  Managing your Medications? N -  Managing your Finances? N -  Housekeeping or managing your Housekeeping? N -    Immunizations and Health Maintenance Immunization History  Administered Date(s) Administered  . Hepatitis B, ped/adol 09/30/1998, 11/04/1998, 04/07/1999  . PPD Test 07/15/2016  . Td 02/14/1996  . Tdap 02/07/2009   Health Maintenance Due  Topic Date Due  . MAMMOGRAM  01/26/1999  . COLONOSCOPY  01/26/1999  . DEXA SCAN  01/25/2014  . PNA vac Low Risk Adult (1 of 2 - PCV13) 01/25/2014    Patient Care Team: Copland, Gay Filler, MD as PCP - General (Family Medicine) Hoy Finlay., MD (Hematology)  Indicate any recent Medical Services you may have received from other than [facility] providers in the past year (date may be approximate).     Assessment:   This is a routine wellness examination for Meredith Dominguez. Physical assessment deferred to PCP.  Hearing/Vision screen Hearing Screening Comments: Able to  hear conversational tones w/o difficulty. No issues reported.    Dietary issues and exercise activities discussed: Current Exercise Habits: The patient does not participate in regular exercise at present, Exercise limited by: None identified Diet (meal preparation, eat out, water intake, caffeinated beverages, dairy products, fruits and vegetables): in general, a "healthy" diet       Goals      Patient Stated   . Restart tai chi (pt-stated)      Depression Screen PHQ 2/9 Scores 04/28/2017  PHQ - 2 Score 0    Fall Risk Fall Risk  04/28/2017  Falls in the past year? No    Cognitive Function: Ad8 score reviewed for issues:  Issues making decisions:no  Less interest in hobbies / activities:no  Repeats questions, stories (family complaining):no  Trouble using ordinary gadgets (microwave, computer, phone):no  Forgets the month or year: no  Mismanaging finances: no  Remembering appts:no  Daily problems with thinking and/or memory:no Ad8 score is=0         Screening Tests Health Maintenance  Topic Date Due  . MAMMOGRAM  01/26/1999  . COLONOSCOPY  01/26/1999  . DEXA SCAN  01/25/2014  . PNA vac Low Risk Adult (1 of 2 - PCV13) 01/25/2014  . INFLUENZA VACCINE  06/23/2017  . TETANUS/TDAP  02/08/2019  . Hepatitis C Screening  Completed      Plan:   Follow up with PCP as directed.  Continue to eat heart healthy diet (full of fruits, vegetables, whole grains, lean protein, water--limit salt, fat, and sugar intake) and increase physical activity as tolerated.  Continue doing brain stimulating activities to keep memory sharp.    I have personally reviewed and noted the following in the patient's chart:   . Medical and social history . Use of alcohol, tobacco or illicit drugs  . Current medications and supplements . Functional ability and status . Nutritional status . Physical activity . Advanced directives . List of other physicians . Hospitalizations,  surgeries, and ER visits in previous 12 months . Vitals . Screenings to include cognitive, depression, and falls . Referrals and appointments  In addition, I have reviewed and discussed with patient certain preventive protocols, quality metrics, and best practice recommendations. A written personalized care plan for preventive services as well as general preventive health recommendations were provided to patient.     Shela Nevin, RN   04/28/2017   I have reviewed the above note by Ms. Novak Stgermaine and agree with her documentation

## 2017-04-28 ENCOUNTER — Encounter: Payer: Self-pay | Admitting: *Deleted

## 2017-04-28 ENCOUNTER — Ambulatory Visit (INDEPENDENT_AMBULATORY_CARE_PROVIDER_SITE_OTHER): Payer: PPO | Admitting: *Deleted

## 2017-04-28 ENCOUNTER — Ambulatory Visit: Payer: PPO | Admitting: *Deleted

## 2017-04-28 VITALS — BP 108/68 | HR 61 | Temp 97.6°F | Ht 67.0 in | Wt 147.7 lb

## 2017-04-28 DIAGNOSIS — Z0000 Encounter for general adult medical examination without abnormal findings: Secondary | ICD-10-CM | POA: Diagnosis not present

## 2017-04-28 NOTE — Patient Instructions (Signed)
  Ms. Meredith Dominguez , Thank you for taking time to come for your Medicare Wellness Visit. I appreciate your ongoing commitment to your health goals. Please review the following plan we discussed and let me know if I can assist you in the future.   These are the goals we discussed: Goals    None      This is a list of the screening recommended for you and due dates:  Health Maintenance  Topic Date Due  . Mammogram  01/26/1999  . Colon Cancer Screening  01/26/1999  . DEXA scan (bone density measurement)  01/25/2014  . Pneumonia vaccines (1 of 2 - PCV13) 01/25/2014  . Flu Shot  06/23/2017  . Tetanus Vaccine  02/08/2019  .  Hepatitis C: One time screening is recommended by Center for Disease Control  (CDC) for  adults born from [birth year] through 1967.   Completed   Continue to eat heart healthy diet (full of fruits, vegetables, whole grains, lean protein, water--limit salt, fat, and sugar intake) and increase physical activity as tolerated.  Continue doing brain stimulating activities to keep memory sharp.

## 2017-05-06 ENCOUNTER — Other Ambulatory Visit: Payer: PPO

## 2017-05-06 ENCOUNTER — Ambulatory Visit: Payer: PPO | Admitting: Hematology & Oncology

## 2017-05-21 ENCOUNTER — Ambulatory Visit (HOSPITAL_BASED_OUTPATIENT_CLINIC_OR_DEPARTMENT_OTHER): Payer: PPO | Admitting: Hematology & Oncology

## 2017-05-21 ENCOUNTER — Telehealth: Payer: Self-pay | Admitting: Family Medicine

## 2017-05-21 ENCOUNTER — Other Ambulatory Visit (HOSPITAL_BASED_OUTPATIENT_CLINIC_OR_DEPARTMENT_OTHER): Payer: PPO

## 2017-05-21 VITALS — BP 124/73 | HR 85 | Temp 98.1°F | Resp 18 | Wt 147.0 lb

## 2017-05-21 DIAGNOSIS — D68020 Von Willebrand disease, type 2a: Secondary | ICD-10-CM

## 2017-05-21 DIAGNOSIS — D680 Von Willebrand's disease: Secondary | ICD-10-CM

## 2017-05-21 DIAGNOSIS — D696 Thrombocytopenia, unspecified: Secondary | ICD-10-CM

## 2017-05-21 LAB — CBC WITH DIFFERENTIAL (CANCER CENTER ONLY)
BASO#: 0 10*3/uL (ref 0.0–0.2)
BASO%: 0.5 % (ref 0.0–2.0)
EOS ABS: 0.1 10*3/uL (ref 0.0–0.5)
EOS%: 1.7 % (ref 0.0–7.0)
HEMATOCRIT: 40.5 % (ref 34.8–46.6)
HEMOGLOBIN: 13.5 g/dL (ref 11.6–15.9)
LYMPH#: 2.2 10*3/uL (ref 0.9–3.3)
LYMPH%: 28.6 % (ref 14.0–48.0)
MCH: 29.3 pg (ref 26.0–34.0)
MCHC: 33.3 g/dL (ref 32.0–36.0)
MCV: 88 fL (ref 81–101)
MONO#: 0.7 10*3/uL (ref 0.1–0.9)
MONO%: 9.1 % (ref 0.0–13.0)
NEUT%: 60.1 % (ref 39.6–80.0)
NEUTROS ABS: 4.7 10*3/uL (ref 1.5–6.5)
Platelets: 82 10*3/uL — ABNORMAL LOW (ref 145–400)
RBC: 4.6 10*6/uL (ref 3.70–5.32)
RDW: 13.8 % (ref 11.1–15.7)
WBC: 7.7 10*3/uL (ref 3.9–10.0)

## 2017-05-21 NOTE — Telephone Encounter (Signed)
error 

## 2017-05-21 NOTE — Telephone Encounter (Signed)
Caller name: °Relationship to patient: °Can be reached: °Pharmacy: ° °Reason for call: ° °

## 2017-05-22 LAB — APTT: aPTT: 28 s (ref 24–33)

## 2017-05-24 LAB — VON WILLEBRAND PANEL
Factor VIII Activity: 50 % — ABNORMAL LOW (ref 57–163)
VON WILLEBRAND AG: 69 % (ref 50–200)
vWF Activity: <10 % — ABNORMAL LOW (ref 50–200)

## 2017-05-24 NOTE — Progress Notes (Signed)
Hematology and Oncology Follow Up Visit  Meredith Dominguez [id number] 11/24/1950 66 y.o. 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 We first saw her back in mid May. At that time, it looks like she has type II a von Willebrand disease. Her von Willebrand activity was less than 10. Her factor VIII activity was 55%.  She has some thrombocytopenia. We saw her in May her platelet count was 88,000. I'm not sure exactly as to why her plate count is on the lower side. We did a ultrasound of her abdomen. This was negative for splenomegaly. There is no cirrhosis.  She still is not sure when she will have her dental procedure. She clearly needs to have some type of intervention before any Dental work or any surgery because of this incredibly low von Willebrand level.  There is now the new von Willebrand concentrate - Vonvendi - which I think would be useful for her. This is basically von Willebrand factor area and it does not have factor VIII.  We will have to see what her levels are today. If she does have a low factor VIII, we may have to actually use Humate-P.   She's had some bruising. She's had no bleeding.  She is not a vegetarian. She's had no weight loss or weight gain.   overall, her performance status is ECOG 1.   Medications:  Current Outpatient Prescriptions:  .  Calcium-Magnesium (CALMAG THINS) 200-50 MG TABS, Take by mouth., Disp: , Rfl:  .  Sesame Oil OIL, by Does not apply route., Disp: , Rfl:  .  Cholecalciferol (VITAMIN D3) 100000 UNIT/GM POWD, 2 drops per day, Disp: , Rfl:  .  desmopressin (DDAVP NASAL) 0.01 % solution, Place 2 sprays (20 mcg total) into the nose daily as needed. Nose bleeds, Disp: 5 mL, Rfl: 12 .  desmopressin (DDAVP) 0.01 % SOLN, , Disp: , Rfl: 10 .  OVER THE COUNTER MEDICATION, Take 1 tablet by mouth 2 (two) times daily.,  Disp: , Rfl:   Allergies:  Allergies  Allergen Reactions  . Gluten Meal Other (See Comments)    sensitivity  . Monosodium Glutamate Other (See Comments)    Hot and feels bad  . Sulfa Antibiotics Rash    Past Medical History, Surgical history, Social history, and Family History were reviewed and updated.  Review of Systems:  As above   Physical Exam:  weight is 147 lb (66.7 kg). Her oral temperature is 98.1 F (36.7 C). Her blood pressure is 124/73 and her pulse is 85. Her respiration is 18 and oxygen saturation is 99%.   Wt Readings from Last 3 Encounters:  05/21/17 147 lb (66.7 kg)  04/28/17 147 lb 11.2 oz (67 kg)  04/06/17 146 lb 6.4 oz (66.4 kg)      Head and neck exam shows no ocular or oral lesions. She has no palatal petechia. There is no ecchymosis within the oral cavity. There is no adenopathy on her neck. Lungs are clear. Cardiac exam shows a regular rate and rhythm with a normal S1 and S2. There are no murmurs, rubs or bruits. Abdomen is soft. She has good bowel sounds. There is no fluid wave. There is no palpable liver or spleen tip. Back exam shows no tenderness over the spine, ribs or hips. Extremities shows no clubbing, cyanosis or edema. Neurological exam  shows no focal neurological deficits. Skin exam does show some scattered ecchymoses.  Lab Results  Component Value Date   WBC 7.7 05/21/2017   HGB 13.5 05/21/2017   HCT 40.5 05/21/2017   MCV 88 05/21/2017   PLT 82 Platelet count consistent in citrate (L) 05/21/2017     Chemistry      Component Value Date/Time   NA 138 02/10/2017 1144   K 4.0 02/10/2017 1144   CL 103 02/10/2017 1144   CO2 28 02/10/2017 1144   BUN 23 02/10/2017 1144   CREATININE 0.90 02/10/2017 1144      Component Value Date/Time   CALCIUM 9.7 02/10/2017 1144   ALKPHOS 52 02/10/2017 1144   AST 18 02/10/2017 1144   ALT 12 02/10/2017 1144   BILITOT 0.6 02/10/2017 1144         Impression and Plan: Meredith Dominguez is a 66 year old  white female. She has type 2A von Willebrand disease.  I am repeating her von Willebrand levels.  The type of replacement concentrate that we use will depend on her levels.  It would be nice if we can just use the von Willebrand concentrate. However, we have to make sure that her factor VIII level is okay.    she is not sure when she will even have any kind of dental work done. I think that even with dental cleaning, she will need some form of replacement therapy.  As always, it was nice seeing her. We will plan to get her back in another month or so.  I spent about 30 minutes with her today.   Josph Macho, MD 7/2/20186:30 PM

## 2017-05-25 ENCOUNTER — Ambulatory Visit (INDEPENDENT_AMBULATORY_CARE_PROVIDER_SITE_OTHER): Payer: PPO | Admitting: Family Medicine

## 2017-05-25 ENCOUNTER — Encounter: Payer: Self-pay | Admitting: Family Medicine

## 2017-05-25 VITALS — BP 118/72 | HR 77 | Temp 98.1°F | Resp 18 | Wt 149.6 lb

## 2017-05-25 DIAGNOSIS — J029 Acute pharyngitis, unspecified: Secondary | ICD-10-CM

## 2017-05-25 HISTORY — DX: Acute pharyngitis, unspecified: J02.9

## 2017-05-25 LAB — POCT RAPID STREP A (OFFICE): RAPID STREP A SCREEN: NEGATIVE

## 2017-05-25 MED ORDER — AMOXICILLIN 500 MG PO CAPS: 500.0000 mg | ORAL_CAPSULE | Freq: Three times a day (TID) | ORAL | 0 refills | Status: DC

## 2017-05-25 NOTE — Patient Instructions (Addendum)
Elderberry liquid, zinc, umcka helpsto boost immune response   Pharyngitis Pharyngitis is redness, pain, and swelling (inflammation) of your pharynx. What are the causes? Pharyngitis is usually caused by infection. Most of the time, these infections are from viruses (viral) and are part of a cold. However, sometimes pharyngitis is caused by bacteria (bacterial). Pharyngitis can also be caused by allergies. Viral pharyngitis may be spread from person to person by coughing, sneezing, and personal items or utensils (cups, forks, spoons, toothbrushes). Bacterial pharyngitis may be spread from person to person by more intimate contact, such as kissing. What are the signs or symptoms? Symptoms of pharyngitis include:  Sore throat.  Tiredness (fatigue).  Low-grade fever.  Headache.  Joint pain and muscle aches.  Skin rashes.  Swollen lymph nodes.  Plaque-like film on throat or tonsils (often seen with bacterial pharyngitis).  How is this diagnosed? Your health care provider will ask you questions about your illness and your symptoms. Your medical history, along with a physical exam, is often all that is needed to diagnose pharyngitis. Sometimes, a rapid strep test is done. Other lab tests may also be done, depending on the suspected cause. How is this treated? Viral pharyngitis will usually get better in 3-4 days without the use of medicine. Bacterial pharyngitis is treated with medicines that kill germs (antibiotics). Follow these instructions at home:  Drink enough water and fluids to keep your urine clear or pale yellow.  Only take over-the-counter or prescription medicines as directed by your health care provider: ? If you are prescribed antibiotics, make sure you finish them even if you start to feel better. ? Do not take aspirin.  Get lots of rest.  Gargle with 8 oz of salt water ( tsp of salt per 1 qt of water) as often as every 1-2 hours to soothe your throat.  Throat  lozenges (if you are not at risk for choking) or sprays may be used to soothe your throat. Contact a health care provider if:  You have large, tender lumps in your neck.  You have a rash.  You cough up green, yellow-brown, or bloody spit. Get help right away if:  Your neck becomes stiff.  You drool or are unable to swallow liquids.  You vomit or are unable to keep medicines or liquids down.  You have severe pain that does not go away with the use of recommended medicines.  You have trouble breathing (not caused by a stuffy nose). This information is not intended to replace advice given to you by your health care provider. Make sure you discuss any questions you have with your health care provider. Document Released: 11/09/2005 Document Revised: 04/16/2016 Document Reviewed: 07/17/2013 Elsevier Interactive Patient Education  2017 [employer]  v

## 2017-05-25 NOTE — Progress Notes (Signed)
Subjective:  I acted as a Neurosurgeon for Dr. Abner Greenspan. [place], [place]    Patient ID: Meredith Dominguez, female    DOB: 1951/09/14, 66 y.o.   MRN: [id number]  No chief complaint on file.   HPI  Patient is in today for an acute visit. She has had a sore throat for a couple of days and is worried because last time she had a sore throat she developed a peritonsillar abscess that could not be lanced due to her VWF. She is struggling with sore throat, malaise, myalgias, and fatigue. No fevers, nausea, anorexia. Has had a mild headache. No chest congestion, no chest pain or SOB  Patient Care Team: Copland, Gwenlyn Found, MD as PCP - General (Family Medicine) Clenton Pare., MD (Hematology)   Past Medical History:  Diagnosis Date  . Anemia   . Pharyngitis 05/25/2017  . Von Willebrand disease (HCC)     Past Surgical History:  Procedure Laterality Date  . BREAST SURGERY     Breast Biopsy Left x2    Family History  Problem Relation Age of Onset  . Leukemia Cousin   . Cancer Mother   . Diabetes Father   . Heart disease Father   . Bleeding Disorder Neg Hx     Social History   Social History  . Marital status: Divorced    Spouse name: N/A  . Number of children: N/A  . Years of education: N/A   Occupational History  . Not on file.   Social History Main Topics  . Smoking status: Never Smoker  . Smokeless tobacco: Never Used  . Alcohol use Yes     Comment: occ  . Drug use: No  . Sexual activity: No   Other Topics Concern  . Not on file   Social History Narrative  . No narrative on file    Outpatient Medications Prior to Visit  Medication Sig Dispense Refill  . Calcium-Magnesium (CALMAG THINS) 200-50 MG TABS Take by mouth.    . Cholecalciferol (VITAMIN D3) 100000 UNIT/GM POWD 2 drops per day    . desmopressin (DDAVP NASAL) 0.01 % solution Place 2 sprays (20 mcg total) into the nose daily as needed. Nose bleeds 5 mL 12  . desmopressin (DDAVP) 0.01 % SOLN   10  . OVER THE  COUNTER MEDICATION Take 1 tablet by mouth 2 (two) times daily.    . Sesame Oil OIL by Does not apply route.     No facility-administered medications prior to visit.     Allergies  Allergen Reactions  . Gluten Meal Other (See Comments)    sensitivity  . Monosodium Glutamate Other (See Comments)    Hot and feels bad  . Sulfa Antibiotics Rash    Review of Systems  Constitutional: Positive for chills and malaise/fatigue. Negative for fever.  HENT: Positive for sore throat. Negative for congestion.   Eyes: Negative for blurred vision.  Respiratory: Negative for shortness of breath.   Cardiovascular: Negative for chest pain, palpitations and leg swelling.  Gastrointestinal: Negative for abdominal pain, blood in stool and nausea.  Genitourinary: Negative for dysuria and frequency.  Musculoskeletal: Positive for myalgias. Negative for falls.  Skin: Negative for rash.  Neurological: Negative for dizziness, loss of consciousness and headaches.  Endo/Heme/Allergies: Negative for environmental allergies.  Psychiatric/Behavioral: Negative for depression. The patient is not nervous/anxious.        Objective:    Physical Exam  Constitutional: She is oriented to person, place, and time. She appears  well-developed and well-nourished. No distress.  HENT:  Head: Normocephalic and atraumatic.  Nose: Nose normal.  Oropharynx erythematous and mildly edematous  Eyes: Right eye exhibits no discharge. Left eye exhibits no discharge.  Neck: Normal range of motion. Neck supple. Thyromegaly present.  Cardiovascular: Normal rate and regular rhythm.   No murmur heard. Pulmonary/Chest: Effort normal and breath sounds normal.  Abdominal: Soft. Bowel sounds are normal. There is no tenderness.  Musculoskeletal: She exhibits no edema.  Neurological: She is alert and oriented to person, place, and time.  Skin: Skin is warm and dry.  Psychiatric: She has a normal mood and affect.  Nursing note and vitals  reviewed.   BP 118/72 (BP Location: Left Arm, Patient Position: Sitting, Cuff Size: Normal)   Pulse 77   Temp 98.1 F (36.7 C) (Oral)   Resp 18   Wt 149 lb 9.6 oz (67.9 kg)   SpO2 98%   BMI 23.43 kg/m  Wt Readings from Last 3 Encounters:  05/25/17 149 lb 9.6 oz (67.9 kg)  05/21/17 147 lb (66.7 kg)  04/28/17 147 lb 11.2 oz (67 kg)   BP Readings from Last 3 Encounters:  05/25/17 118/72  05/21/17 124/73  04/28/17 108/68     Immunization History  Administered Date(s) Administered  . Hepatitis B, ped/adol 09/30/1998, 11/04/1998, 04/07/1999  . PPD Test 07/15/2016  . Td 02/14/1996  . Tdap 02/07/2009    Health Maintenance  Topic Date Due  . PNA vac Low Risk Adult (1 of 2 - PCV13) 01/25/2014  . MAMMOGRAM  07/02/2017 (Originally 01/26/1999)  . DEXA SCAN  04/28/2018 (Originally 01/25/2014)  . COLONOSCOPY  04/28/2018 (Originally 01/26/1999)  . INFLUENZA VACCINE  06/23/2017  . TETANUS/TDAP  02/08/2019  . Hepatitis C Screening  Completed    Lab Results  Component Value Date   WBC 7.7 05/21/2017   HGB 13.5 05/21/2017   HCT 40.5 05/21/2017   PLT 82 Platelet count consistent in citrate (L) 05/21/2017   GLUCOSE 77 02/10/2017   CHOL 210 (H) 02/10/2017   TRIG 93.0 02/10/2017   HDL 76.10 02/10/2017   LDLCALC 116 (H) 02/10/2017   ALT 12 02/10/2017   AST 18 02/10/2017   NA 138 02/10/2017   K 4.0 02/10/2017   CL 103 02/10/2017   CREATININE 0.90 02/10/2017   BUN 23 02/10/2017   CO2 28 02/10/2017   TSH 1.85 02/10/2017    Lab Results  Component Value Date   TSH 1.85 02/10/2017   Lab Results  Component Value Date   WBC 7.7 05/21/2017   HGB 13.5 05/21/2017   HCT 40.5 05/21/2017   MCV 88 05/21/2017   PLT 82 Platelet count consistent in citrate (L) 05/21/2017   Lab Results  Component Value Date   NA 138 02/10/2017   K 4.0 02/10/2017   CO2 28 02/10/2017   GLUCOSE 77 02/10/2017   BUN 23 02/10/2017   CREATININE 0.90 02/10/2017   BILITOT 0.6 02/10/2017   ALKPHOS 52  02/10/2017   AST 18 02/10/2017   ALT 12 02/10/2017   PROT 7.3 02/10/2017   ALBUMIN 4.2 02/10/2017   CALCIUM 9.7 02/10/2017   ANIONGAP 7 12/23/2016   GFR 66.17 02/10/2017   Lab Results  Component Value Date   CHOL 210 (H) 02/10/2017   Lab Results  Component Value Date   HDL 76.10 02/10/2017   Lab Results  Component Value Date   LDLCALC 116 (H) 02/10/2017   Lab Results  Component Value Date   TRIG 93.0 02/10/2017  Lab Results  Component Value Date   CHOLHDL 3 02/10/2017   No results found for: HGBA1C       Assessment & Plan:   Problem List Items Addressed This Visit    Pharyngitis    Rapid strep is negative but recent history of tonsillar abscess so will send strep culture and start Amoxicillin. She cannot have an abscess lanced due to the VWF       Other Visit Diagnoses    Sore throat    -  Primary   Relevant Orders   POCT rapid strep A (Completed)   Culture, Group A Strep      I am having Ms. Ruda start on amoxicillin. I am also having her maintain her OVER THE COUNTER MEDICATION, desmopressin, Vitamin D3, desmopressin, Sesame Oil, and Calcium-Magnesium.  Meds ordered this encounter  Medications  . amoxicillin (AMOXIL) 500 MG capsule    Sig: Take 1 capsule (500 mg total) by mouth 3 (three) times daily.    Dispense:  30 capsule    Refill:  0    CMA served as scribe during this visit. History, Physical and Plan performed by medical provider. Documentation and orders reviewed and attested to.  Danise EdgeStacey Vian Fluegel, MD

## 2017-05-25 NOTE — Assessment & Plan Note (Addendum)
Rapid strep is negative but recent history of tonsillar abscess so will send strep culture and start Amoxicillin. She cannot have an abscess lanced due to the VWF

## 2017-05-27 LAB — CULTURE, GROUP A STREP

## 2017-07-16 ENCOUNTER — Ambulatory Visit (HOSPITAL_BASED_OUTPATIENT_CLINIC_OR_DEPARTMENT_OTHER): Payer: PPO | Admitting: Hematology & Oncology

## 2017-07-16 ENCOUNTER — Other Ambulatory Visit (HOSPITAL_BASED_OUTPATIENT_CLINIC_OR_DEPARTMENT_OTHER): Payer: PPO

## 2017-07-16 VITALS — BP 127/68 | HR 84 | Temp 98.3°F | Resp 18 | Wt 150.0 lb

## 2017-07-16 DIAGNOSIS — D680 Von Willebrand's disease: Secondary | ICD-10-CM

## 2017-07-16 DIAGNOSIS — D68020 Von Willebrand disease, type 2a: Secondary | ICD-10-CM

## 2017-07-16 LAB — CBC WITH DIFFERENTIAL (CANCER CENTER ONLY)
BASO#: 0 10*3/uL (ref 0.0–0.2)
BASO%: 0.4 % (ref 0.0–2.0)
EOS%: 2.5 % (ref 0.0–7.0)
Eosinophils Absolute: 0.2 10*3/uL (ref 0.0–0.5)
HCT: 39.7 % (ref 34.8–46.6)
HGB: 13.3 g/dL (ref 11.6–15.9)
LYMPH#: 2.2 10*3/uL (ref 0.9–3.3)
LYMPH%: 25.8 % (ref 14.0–48.0)
MCH: 29.8 pg (ref 26.0–34.0)
MCHC: 33.5 g/dL (ref 32.0–36.0)
MCV: 89 fL (ref 81–101)
MONO#: 0.9 10*3/uL (ref 0.1–0.9)
MONO%: 9.9 % (ref 0.0–13.0)
NEUT#: 5.3 10*3/uL (ref 1.5–6.5)
NEUT%: 61.4 % (ref 39.6–80.0)
RBC: 4.46 10*6/uL (ref 3.70–5.32)
RDW: 13.8 % (ref 11.1–15.7)
WBC: 8.6 10*3/uL (ref 3.9–10.0)

## 2017-07-16 NOTE — Progress Notes (Signed)
Hematology and Oncology Follow Up Visit  Meredith Dominguez [id number] [birth year] 66 y.o. 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 Her last saw her back in late June. Since then, she's been traveling a bit. She has been up to [place]. She's been in [place].  She has had no actual bleeding. She does have some bruising.  When we last saw her in June, her von Willebrand's studies still showed a <10 vWF. Her factor VIII level was 50% and her actual von Willebrand factor was 69%.  She is still not sure when she will have the dental cleaning. Her she's had no cough or shortness of breath. She's had no abdominal pain. There's been no change in bowel or bladder habits. She's had no leg swelling. She's had no rashes. She's had no fever.   Her appetite has been pretty good. There's been no nausea or vomiting.  She's had no swallowing problems. There's been no visual difficulties.  Overall, her performance status is ECOG 1. .   Medications:  Current Outpatient Prescriptions:  .  magnesium 30 MG tablet, Take 30 mg by mouth 2 (two) times daily., Disp: , Rfl:  .  vitamin B-12 (CYANOCOBALAMIN) 500 MCG tablet, Take 500 mcg by mouth daily., Disp: , Rfl:  .  amoxicillin (AMOXIL) 500 MG capsule, Take 1 capsule (500 mg total) by mouth 3 (three) times daily., Disp: 30 capsule, Rfl: 0 .  Calcium-Magnesium (CALMAG THINS) 200-50 MG TABS, Take by mouth., Disp: , Rfl:  .  Cholecalciferol (VITAMIN D3) 100000 UNIT/GM POWD, 2 drops per day, Disp: , Rfl:  .  desmopressin (DDAVP NASAL) 0.01 % solution, Place 2 sprays (20 mcg total) into the nose daily as needed. Nose bleeds, Disp: 5 mL, Rfl: 12 .  desmopressin (DDAVP) 0.01 % SOLN, , Disp: , Rfl: 10 .  OVER THE COUNTER MEDICATION, Take 1 tablet by mouth 2 (two) times daily., Disp: , Rfl:  .  Sesame Oil OIL, by Does not apply  route., Disp: , Rfl:   Allergies:  Allergies  Allergen Reactions  . Gluten Meal Other (See Comments)    sensitivity  . Monosodium Glutamate Other (See Comments)    Hot and feels bad  . Sulfa Antibiotics Rash    Past Medical History, Surgical history, Social history, and Family History were reviewed and updated.  Review of Systems:  As stated in the interim history  Physical Exam:  weight is 150 lb (68 kg). Her oral temperature is 98.3 F (36.8 C). Her blood pressure is 127/68 and her pulse is 84. Her respiration is 18 and oxygen saturation is 100%.   Wt Readings from Last 3 Encounters:  07/16/17 150 lb (68 kg)  05/25/17 149 lb 9.6 oz (67.9 kg)  05/21/17 147 lb (66.7 kg)     Physical Exam  Constitutional: She is oriented to person, place, and time.  HENT:  Head: Normocephalic and atraumatic.  Mouth/Throat: Oropharynx is clear and moist.  Eyes: Pupils are equal, round, and reactive to light. EOM are normal.  Neck: Normal range of motion.  Cardiovascular: Normal rate, regular rhythm and normal heart sounds.   Pulmonary/Chest: Effort normal and breath sounds normal.  Abdominal: Soft. Bowel sounds are normal.  Musculoskeletal: Normal range of motion. She exhibits no edema, tenderness or deformity.  Lymphadenopathy:    She has no cervical adenopathy.  Neurological: She is alert and oriented to person, place, and time.  Skin: Skin is warm and dry. No rash noted. No erythema.  She does have some scattered ecchymoses. There are a couple large ones on her legs. There are no actual hematomas.  Psychiatric: She has a normal mood and affect. Her behavior is normal. Judgment and thought content normal.  Vitals reviewed.    Lab Results  Component Value Date   WBC 8.6 07/16/2017   HGB 13.3 07/16/2017   HCT 39.7 07/16/2017   MCV 89 07/16/2017   PLT 90 Platelet count consistent in citrate (L) 07/16/2017     Chemistry      Component Value Date/Time   NA 138 02/10/2017 1144    K 4.0 02/10/2017 1144   CL 103 02/10/2017 1144   CO2 28 02/10/2017 1144   BUN 23 02/10/2017 1144   CREATININE 0.90 02/10/2017 1144      Component Value Date/Time   CALCIUM 9.7 02/10/2017 1144   ALKPHOS 52 02/10/2017 1144   AST 18 02/10/2017 1144   ALT 12 02/10/2017 1144   BILITOT 0.6 02/10/2017 1144         Impression and Plan: Meredith Dominguez is a 66 year old white female. She has type 2A von Willebrand disease.  I told her that she have a dental cleaning done when she would like. She will like to try to do this without any von Willebrand replacement therapy. I suppose this might be reasonable. Her last PTT was normal.  She will let me know about the dental cleaning.  Otherwise, I probably get her back in 3 months. She Serling is to call us if she has any bleeding trouble or if there is any potential for any procedure.   Josph Macho, MD 8/24/20184:56 PM

## 2017-07-17 LAB — APTT: aPTT: 30 s (ref 24–33)

## 2017-07-21 LAB — VON WILLEBRAND PANEL
Factor VIII Activity: 83 % (ref 57–163)
VON WILLEBRAND FACTOR: 10 % — AB (ref 50–200)
von Willebrand Factor (vWF) Ag: 78 % (ref 50–200)

## 2017-08-05 ENCOUNTER — Encounter (HOSPITAL_BASED_OUTPATIENT_CLINIC_OR_DEPARTMENT_OTHER): Payer: Self-pay

## 2017-08-05 ENCOUNTER — Emergency Department (HOSPITAL_BASED_OUTPATIENT_CLINIC_OR_DEPARTMENT_OTHER)
Admission: EM | Admit: 2017-08-05 | Discharge: 2017-08-06 | Disposition: A | Discharge status: Home / Self Care | Payer: PPO | Attending: Emergency Medicine | Admitting: Emergency Medicine

## 2017-08-05 ENCOUNTER — Emergency Department (HOSPITAL_BASED_OUTPATIENT_CLINIC_OR_DEPARTMENT_OTHER): Payer: PPO

## 2017-08-05 DIAGNOSIS — M79672 Pain in left foot: Secondary | ICD-10-CM | POA: Diagnosis not present

## 2017-08-05 DIAGNOSIS — Y939 Activity, unspecified: Secondary | ICD-10-CM | POA: Diagnosis not present

## 2017-08-05 DIAGNOSIS — Z79899 Other long term (current) drug therapy: Secondary | ICD-10-CM | POA: Diagnosis not present

## 2017-08-05 DIAGNOSIS — W228XXA Striking against or struck by other objects, initial encounter: Secondary | ICD-10-CM | POA: Diagnosis not present

## 2017-08-05 DIAGNOSIS — Y92019 Unspecified place in single-family (private) house as the place of occurrence of the external cause: Secondary | ICD-10-CM | POA: Diagnosis not present

## 2017-08-05 DIAGNOSIS — S92515A Nondisplaced fracture of proximal phalanx of left lesser toe(s), initial encounter for closed fracture: Secondary | ICD-10-CM | POA: Diagnosis not present

## 2017-08-05 DIAGNOSIS — R21 Rash and other nonspecific skin eruption: Secondary | ICD-10-CM

## 2017-08-05 DIAGNOSIS — S99922A Unspecified injury of left foot, initial encounter: Secondary | ICD-10-CM | POA: Diagnosis present

## 2017-08-05 DIAGNOSIS — Y999 Unspecified external cause status: Secondary | ICD-10-CM | POA: Insufficient documentation

## 2017-08-05 NOTE — ED Triage Notes (Signed)
C/o left foot injury yesterday-no break in skin noted-NAD-steady gait

## 2017-08-05 NOTE — Discharge Instructions (Signed)
Keep toe elevated when at home. Wear a hard sole shoe. Ice on and off several times a day. Take tylenol for pain. Follow up with orthopedics for recheck.

## 2017-08-05 NOTE — ED Notes (Signed)
ED Provider at bedside. 

## 2017-08-06 NOTE — ED Provider Notes (Signed)
[facility]   CSN: [id number] Arrival date & time: 08/05/17  2018     History   Chief Complaint Chief Complaint  Patient presents with  . Foot Injury    HPI Meredith Dominguez is a 66 y.o. female.  HPI Meredith Dominguez is a 66 y.o. female with history of von Willebrand's disease, anemia, presents to emergency department complaining of toe pain. Patient states she was walking through the house and hit her toe on a piece of furniture. She reports pain and swelling to her fifth left toe. She states this incident occurred yesterday. Today she has been doing her normal activities and walking, and states she went to a chiropractor for her back pain and was told to come to the ED to get her toe x-ray. She states pain is minimal, worse when walking. Denies any other injuries. Denies significant swelling or bruising. She has iced her toe at home, no other treatment prior to coming in.  Past Medical History:  Diagnosis Date  . Anemia   . Pharyngitis 05/25/2017  . Von Willebrand disease [facility])     Patient Active Problem List   Diagnosis Date Noted  . Pharyngitis 05/25/2017  . Von Willebrand disease, type IIa (HCC) 07/29/2016  . Acute blood loss anemia 07/29/2016  . Thrombocytopenia (HCC) 07/29/2016  . Bleeding gums     Past Surgical History:  Procedure Laterality Date  . BREAST SURGERY     Breast Biopsy Left x2    OB History    No data available       Home Medications    Prior to Admission medications   Medication Sig Start Date End Date Taking? Authorizing Provider  amoxicillin (AMOXIL) 500 MG capsule Take 1 capsule (500 mg total) by mouth 3 (three) times daily. 05/25/17   Bradd Canary, MD  Calcium-Magnesium (CALMAG THINS) 200-50 MG TABS Take by mouth.    [provider]  Cholecalciferol (VITAMIN D3) 100000 UNIT/GM POWD 2 drops per day    [provider]  desmopressin (DDAVP NASAL) 0.01 % solution Place 2 sprays (20 mcg total) into  the nose daily as needed. Nose bleeds 07/29/16   Rama, Maryruth Bun, MD  desmopressin (DDAVP) 0.01 % SOLN  02/23/17   [provider]  magnesium 30 MG tablet Take 30 mg by mouth 2 (two) times daily.    [provider]  OVER THE COUNTER MEDICATION Take 1 tablet by mouth 2 (two) times daily.    [provider]  Sesame Oil OIL by Does not apply route.    [provider]  vitamin B-12 (CYANOCOBALAMIN) 500 MCG tablet Take 500 mcg by mouth daily.    [provider]    Family History Family History  Problem Relation Age of Onset  . Leukemia Cousin   . Cancer Mother   . Diabetes Father   . Heart disease Father   . Bleeding Disorder Neg Hx     Social History Social History  Substance Use Topics  . Smoking status: Never Smoker  . Smokeless tobacco: Never Used  . Alcohol use Yes     Comment: occ     Allergies   Gluten meal; Monosodium glutamate; and Sulfa antibiotics   Review of Systems Review of Systems  Constitutional: Negative for chills and fever.  Musculoskeletal: Positive for arthralgias and joint swelling.  Neurological: Negative for weakness and numbness.  Hematological: Bruises/bleeds easily.     Physical Exam Updated Vital Signs BP 110/75 (BP  Location: Left Arm)   Pulse 81   Temp 98.1 F (36.7 C) (Oral)   Resp 16   SpO2 99%   Physical Exam  Constitutional: She is oriented to person, place, and time. She appears well-developed and well-nourished. No distress.  Eyes: Conjunctivae are normal.  Neck: Neck supple.  Musculoskeletal: She exhibits edema and tenderness. She exhibits no deformity.  Bruising noted at the MCP joint of the left fifth toe. Mild ttp over the base of the toe. Cap refill <2 sec distally to the toe. Rest of the foot normal. DP pulses intact.   Neurological: She is alert and oriented to person, place, and time.  Skin: Skin is warm and dry.  Nursing note and vitals reviewed.    ED Treatments / Results    Labs (all labs ordered are listed, but only abnormal results are displayed) Labs Reviewed - No data to display  EKG  EKG Interpretation None       Radiology Dg Foot Complete Left  Result Date: 08/05/2017 CLINICAL DATA:  Left foot pain since a blow to the foot on a container water last night. Initial encounter. EXAM: LEFT FOOT - COMPLETE 3+ VIEW COMPARISON:  None. FINDINGS: The patient has a fracture of the medial corner of the base of the proximal phalanx of the little toe. The fracture fragment is triangular in shape and measures 0.3 cm at the articular surface by 0.4 cm long. Fracture fragment shows mild medial displacement. No other acute bony or joint abnormality is identified. No evidence arthropathy. Plantar calcaneal spur noted. IMPRESSION: Acute fracture through the medial corner of the base of the proximal phalanx of the little toe as described. Electronically Signed   By: Drusilla Kanner M.D.   On: 08/05/2017 21:00    Procedures Procedures (including critical care time)  Medications Ordered in ED Medications - No data to display   Initial Impression / Assessment and Plan / ED Course  I have reviewed the triage vital signs and the nursing notes.  Pertinent labs & imaging results that were available during my care of the patient were reviewed by me and considered in my medical decision making (see chart for details).     Patient in emergency department with swelling and bruising to the toe. X-ray showing fracture described above. Toes are vascular intact. Placed in the flat hard sole shoe, however patient states it is not comfortable and she is unable to wear. Advised to wear a comfortable hard sole shoe that she can find. Follow with orthopedic specialist.  Vitals:   08/05/17 2030 08/06/17 0000  BP: 124/82 110/75  Pulse: 84 81  Resp: 18 16  Temp: 98.1 F (36.7 C)   TempSrc: Oral   SpO2: 100% 99%     Final Clinical Impressions(s) / ED Diagnoses   Final  diagnoses:  Rash  Closed nondisplaced fracture of proximal phalanx of lesser toe of left foot, initial encounter    New Prescriptions Discharge Medication List as of 08/05/2017 11:53 PM       Jaynie Crumble, PA-C 08/06/17 0104    Alvira Monday, MD 08/09/17 2349

## 2017-08-09 DIAGNOSIS — S92505A Nondisplaced unspecified fracture of left lesser toe(s), initial encounter for closed fracture: Secondary | ICD-10-CM | POA: Diagnosis not present

## 2017-09-06 DIAGNOSIS — S92505D Nondisplaced unspecified fracture of left lesser toe(s), subsequent encounter for fracture with routine healing: Secondary | ICD-10-CM | POA: Diagnosis not present

## 2018-04-22 NOTE — Progress Notes (Signed)
Subjective:   Meredith Dominguez is a 67 y.o. female who presents for Medicare Annual (Subsequent) preventive examination.  Review of Systems: No ROS.  Medicare Wellness Visit. Additional risk factors are reflected in the social history.  Cardiac Risk Factors include: advanced age (>65men, >54 women) Sleep patterns:  Sleeps well Home Safety/Smoke Alarms: Feels safe in home. Smoke alarms in place.  Living environment; residence and Firearm Safety: Lives alone. 1st floor apt.    Female:   Pap- declines      Mammo-  declines     Dexa scan- declines CCS- declines    Objective:     Vitals: BP 110/60 (BP Location: Left Arm, Patient Position: Sitting, Cuff Size: Normal)   Pulse 88   Ht 5' 6.5" (1.689 m)   Wt 158 lb 4.8 oz (71.8 kg)   SpO2 97%   BMI 25.17 kg/m   Body mass index is 25.17 kg/m.  Advanced Directives 04/29/2018 08/05/2017 07/16/2017 05/21/2017 04/28/2017 04/06/2017 12/23/2016  Does Patient Have a Medical Advance Directive? No No No No Yes Yes Yes  Type of Advance Directive - - - - Midwife;Living will Healthcare Power of [place];Living will Living will  Copy of Healthcare Power of Attorney in Chart? - - - - No - copy requested - -  Would patient like information on creating a medical advance directive? Yes (MAU/Ambulatory/Procedural Areas - Information given) - - - - - -    Tobacco Social History   Tobacco Use  Smoking Status Never Smoker  Smokeless Tobacco Never Used     Counseling given: Not Answered   Clinical Intake: Pain : No/denies pain    Past Medical History:  Diagnosis Date  . Anemia   . Pharyngitis 05/25/2017  . Von Willebrand disease (HCC)    Past Surgical History:  Procedure Laterality Date  . BREAST SURGERY     Breast Biopsy Left x2   Family History  Problem Relation Age of Onset  . Leukemia Cousin   . Cancer Mother   . Diabetes Father   . Heart disease Father   . Bleeding Disorder Neg Hx    Social History    Socioeconomic History  . Marital status: Divorced    Spouse name: Not on file  . Number of children: Not on file  . Years of education: Not on file  . Highest education level: Not on file  Occupational History  . Not on file  Social Needs  . Financial resource strain: Not on file  . Food insecurity:    Worry: Not on file    Inability: Not on file  . Transportation needs:    Medical: Not on file    Non-medical: Not on file  Tobacco Use  . Smoking status: Never Smoker  . Smokeless tobacco: Never Used  Substance and Sexual Activity  . Alcohol use: Yes    Comment: wine once per week  . Drug use: No  . Sexual activity: Not on file  Lifestyle  . Physical activity:    Days per week: Not on file    Minutes per session: Not on file  . Stress: Not on file  Relationships  . Social connections:    Talks on phone: Not on file    Gets together: Not on file    Attends religious service: Not on file    Active member of club or organization: Not on file    Attends meetings of clubs or organizations: Not on file  Relationship status: Not on file  Other Topics Concern  . Not on file  Social History Narrative  . Not on file    Outpatient Encounter Medications as of 04/29/2018  Medication Sig  . Calcium-Magnesium (CALMAG THINS) 200-50 MG TABS Take by mouth.  . Cholecalciferol (VITAMIN D3) 100000 UNIT/GM POWD 2 drops per day  . magnesium 30 MG tablet Take 30 mg by mouth 2 (two) times daily.  Marland Kitchen OVER THE COUNTER MEDICATION Take 1 tablet by mouth 2 (two) times daily.  . Sesame Oil OIL by Does not apply route.  . vitamin B-12 (CYANOCOBALAMIN) 500 MCG tablet Take 500 mcg by mouth daily.  Marland Kitchen desmopressin (DDAVP NASAL) 0.01 % solution Place 2 sprays (20 mcg total) into the nose daily as needed. Nose bleeds (Patient not taking: Reported on 04/29/2018)  . desmopressin (DDAVP) 0.01 % SOLN   . [DISCONTINUED] amoxicillin (AMOXIL) 500 MG capsule Take 1 capsule (500 mg total) by mouth 3 (three)  times daily.   No facility-administered encounter medications on file as of 04/29/2018.     Activities of Daily Living In your present state of health, do you have any difficulty performing the following activities: 04/29/2018  Hearing? N  Vision? N  Comment wears glasses.  Difficulty concentrating or making decisions? N  Walking or climbing stairs? N  Dressing or bathing? N  Doing errands, shopping? N  Preparing Food and eating ? N  Using the Toilet? N  In the past six months, have you accidently leaked urine? N  Do you have problems with loss of bowel control? N  Managing your Medications? N  Managing your Finances? N  Housekeeping or managing your Housekeeping? N  Some recent data might be hidden    Patient Care Team: Copland, Gwenlyn Found, MD as PCP - General (Family Medicine) Clenton Pare., MD (Hematology)    Assessment:   This is a routine wellness examination for Meredith Dominguez. Physical assessment deferred to PCP.  Exercise Activities and Dietary recommendations Current Exercise Habits: Home exercise routine, Type of exercise: walking, Time (Minutes): 15, Frequency (Times/Week): 7, Weekly Exercise (Minutes/Week): 105, Intensity: Mild, Exercise limited by: None identified Diet (meal preparation, eat out, water intake, caffeinated beverages, dairy products, fruits and vegetables): in general, a "healthy" diet  , well balanced      Goals    . Take a long vacation as planned.       Fall Risk Fall Risk  04/29/2018 04/28/2017  Falls in the past year? No No    Depression Screen PHQ 2/9 Scores 04/29/2018 04/28/2017  PHQ - 2 Score 0 0     Cognitive Function Ad8 score reviewed for issues:  Issues making decisions:no  Less interest in hobbies / activities:no  Repeats questions, stories (family complaining):no  Trouble using ordinary gadgets (microwave, computer, phone):no  Forgets the month or year: no  Mismanaging finances: no  Remembering appts:no Daily problems with  thinking and/or memory:no Ad8 score is=0            Immunization History  Administered Date(s) Administered  . Hepatitis B, ped/adol 09/30/1998, 11/04/1998, 04/07/1999  . PPD Test 07/15/2016  . Td 02/14/1996  . Tdap 02/07/2009   Screening Tests Health Maintenance  Topic Date Due  . MAMMOGRAM  01/26/1999  . COLONOSCOPY  01/26/1999  . DEXA SCAN  01/25/2014  . PNA vac Low Risk Adult (1 of 2 - PCV13) 01/25/2014  . INFLUENZA VACCINE  06/23/2018  . TETANUS/TDAP  02/08/2019  . Hepatitis C Screening  Completed       Plan:    Please schedule your next medicare wellness visit with me in 1 yr.  Continue to eat heart healthy diet (full of fruits, vegetables, whole grains, lean protein, water--limit salt, fat, and sugar intake) and increase physical activity as tolerated.  Continue doing brain stimulating activities (puzzles, reading, adult coloring books, staying active) to keep memory sharp.     I have personally reviewed and noted the following in the patient's chart:   . Medical and social history . Use of alcohol, tobacco or illicit drugs  . Current medications and supplements . Functional ability and status . Nutritional status . Physical activity . Advanced directives . List of other physicians . Hospitalizations, surgeries, and ER visits in previous 12 months . Vitals . Screenings to include cognitive, depression, and falls . Referrals and appointments  In addition, I have reviewed and discussed with patient certain preventive protocols, quality metrics, and best practice recommendations. A written personalized care plan for preventive services as well as general preventive health recommendations were provided to patient.     [place]on Gully, [place]  04/29/2018

## 2018-04-29 ENCOUNTER — Ambulatory Visit (INDEPENDENT_AMBULATORY_CARE_PROVIDER_SITE_OTHER): Payer: PPO | Admitting: *Deleted

## 2018-04-29 ENCOUNTER — Encounter: Payer: Self-pay | Admitting: *Deleted

## 2018-04-29 VITALS — BP 110/60 | HR 88 | Ht 66.5 in | Wt 158.3 lb

## 2018-04-29 DIAGNOSIS — Z0000 Encounter for general adult medical examination without abnormal findings: Secondary | ICD-10-CM | POA: Diagnosis not present

## 2018-04-29 NOTE — Progress Notes (Signed)
Noted. Agree with above.  Jilda Rocheicholas Paul CarlosWendling, DO 04/29/18 4:57 PM

## 2018-04-29 NOTE — Patient Instructions (Signed)
Please schedule your next medicare wellness visit with me in 1 yr.  Continue to eat heart healthy diet (full of fruits, vegetables, whole grains, lean protein, water--limit salt, fat, and sugar intake) and increase physical activity as tolerated.  Continue doing brain stimulating activities (puzzles, reading, adult coloring books, staying active) to keep memory sharp.    Meredith Dominguez , Thank you for taking time to come for your Medicare Wellness Visit. I appreciate your ongoing commitment to your health goals. Please review the following plan we discussed and let me know if I can assist you in the future.   These are the goals we discussed: Goals    . Take a long vacation as planned.       This is a list of the screening recommended for you and due dates:  Health Maintenance  Topic Date Due  . Mammogram  01/26/1999  . Colon Cancer Screening  01/26/1999  . DEXA scan (bone density measurement)  01/25/2014  . Pneumonia vaccines (1 of 2 - PCV13) 01/25/2014  . Flu Shot  06/23/2018  . Tetanus Vaccine  02/08/2019  .  Hepatitis C: One time screening is recommended by Center for Disease Control  (CDC) for  adults born from [birth year] through 1967.   Completed    Health Maintenance for Postmenopausal Women Menopause is a normal process in which your reproductive ability comes to an end. This process happens gradually over a span of months to years, usually between the ages of 44 and 62. Menopause is complete when you have missed 12 consecutive menstrual periods. It is important to talk with your health care provider about some of the most common conditions that affect postmenopausal women, such as heart disease, cancer, and bone loss (osteoporosis). Adopting a healthy lifestyle and getting preventive care can help to promote your health and wellness. Those actions can also lower your chances of developing some of these common conditions. What should I know about menopause? During menopause, you may  experience a number of symptoms, such as:  Moderate-to-severe hot flashes.  Night sweats.  Decrease in sex drive.  Mood swings.  Headaches.  Tiredness.  Irritability.  Memory problems.  Insomnia.  Choosing to treat or not to treat menopausal changes is an individual decision that you make with your health care provider. What should I know about hormone replacement therapy and supplements? Hormone therapy products are effective for treating symptoms that are associated with menopause, such as hot flashes and night sweats. Hormone replacement carries certain risks, especially as you become older. If you are thinking about using estrogen or estrogen with progestin treatments, discuss the benefits and risks with your health care provider. What should I know about heart disease and stroke? Heart disease, heart attack, and stroke become more likely as you age. This may be due, in part, to the hormonal changes that your body experiences during menopause. These can affect how your body processes dietary fats, triglycerides, and cholesterol. Heart attack and stroke are both medical emergencies. There are many things that you can do to help prevent heart disease and stroke:  Have your blood pressure checked at least every 1-2 years. High blood pressure causes heart disease and increases the risk of stroke.  If you are 65-18 years old, ask your health care provider if you should take aspirin to prevent a heart attack or a stroke.  Do not use any tobacco products, including cigarettes, chewing tobacco, or electronic cigarettes. If you need help quitting, ask your health  care provider.  It is important to eat a healthy diet and maintain a healthy weight. ? Be sure to include plenty of vegetables, fruits, low-fat dairy products, and lean protein. ? Avoid eating foods that are high in solid fats, added sugars, or salt (sodium).  Get regular exercise. This is one of the most important things  that you can do for your health. ? Try to exercise for at least 150 minutes each week. The type of exercise that you do should increase your heart rate and make you sweat. This is known as moderate-intensity exercise. ? Try to do strengthening exercises at least twice each week. Do these in addition to the moderate-intensity exercise.  Know your numbers.Ask your health care provider to check your cholesterol and your blood glucose. Continue to have your blood tested as directed by your health care provider.  What should I know about cancer screening? There are several types of cancer. Take the following steps to reduce your risk and to catch any cancer development as early as possible. Breast Cancer  Practice breast self-awareness. ? This means understanding how your breasts normally appear and feel. ? It also means doing regular breast self-exams. Let your health care provider know about any changes, no matter how small.  If you are 78 or older, have a clinician do a breast exam (clinical breast exam or CBE) every year. Depending on your age, family history, and medical history, it may be recommended that you also have a yearly breast X-ray (mammogram).  If you have a family history of breast cancer, talk with your health care provider about genetic screening.  If you are at high risk for breast cancer, talk with your health care provider about having an MRI and a mammogram every year.  Breast cancer (BRCA) gene test is recommended for women who have family members with BRCA-related cancers. Results of the assessment will determine the need for genetic counseling and BRCA1 and for BRCA2 testing. BRCA-related cancers include these types: ? Breast. This occurs in males or females. ? Ovarian. ? Tubal. This may also be called fallopian tube cancer. ? Cancer of the abdominal or pelvic lining (peritoneal cancer). ? Prostate. ? Pancreatic.  Cervical, Uterine, and Ovarian Cancer Your health  care provider may recommend that you be screened regularly for cancer of the pelvic organs. These include your ovaries, uterus, and vagina. This screening involves a pelvic exam, which includes checking for microscopic changes to the surface of your cervix (Pap test).  For women ages 21-65, health care providers may recommend a pelvic exam and a Pap test every three years. For women ages 91-65, they may recommend the Pap test and pelvic exam, combined with testing for human papilloma virus (HPV), every five years. Some types of HPV increase your risk of cervical cancer. Testing for HPV may also be done on women of any age who have unclear Pap test results.  Other health care providers may not recommend any screening for nonpregnant women who are considered low risk for pelvic cancer and have no symptoms. Ask your health care provider if a screening pelvic exam is right for you.  If you have had past treatment for cervical cancer or a condition that could lead to cancer, you need Pap tests and screening for cancer for at least 20 years after your treatment. If Pap tests have been discontinued for you, your risk factors (such as having a new sexual partner) need to be reassessed to determine if you should  start having screenings again. Some women have medical problems that increase the chance of getting cervical cancer. In these cases, your health care provider may recommend that you have screening and Pap tests more often.  If you have a family history of uterine cancer or ovarian cancer, talk with your health care provider about genetic screening.  If you have vaginal bleeding after reaching menopause, tell your health care provider.  There are currently no reliable tests available to screen for ovarian cancer.  Lung Cancer Lung cancer screening is recommended for adults 43-51 years old who are at high risk for lung cancer because of a history of smoking. A yearly low-dose CT scan of the lungs is  recommended if you:  Currently smoke.  Have a history of at least 30 pack-years of smoking and you currently smoke or have quit within the past 15 years. A pack-year is smoking an average of one pack of cigarettes per day for one year.  Yearly screening should:  Continue until it has been 15 years since you quit.  Stop if you develop a health problem that would prevent you from having lung cancer treatment.  Colorectal Cancer  This type of cancer can be detected and can often be prevented.  Routine colorectal cancer screening usually begins at age 56 and continues through age 86.  If you have risk factors for colon cancer, your health care provider may recommend that you be screened at an earlier age.  If you have a family history of colorectal cancer, talk with your health care provider about genetic screening.  Your health care provider may also recommend using home test kits to check for hidden blood in your stool.  A small camera at the end of a tube can be used to examine your colon directly (sigmoidoscopy or colonoscopy). This is done to check for the earliest forms of colorectal cancer.  Direct examination of the colon should be repeated every 5-10 years until age 12. However, if early forms of precancerous polyps or small growths are found or if you have a family history or genetic risk for colorectal cancer, you may need to be screened more often.  Skin Cancer  Check your skin from head to toe regularly.  Monitor any moles. Be sure to tell your health care provider: ? About any new moles or changes in moles, especially if there is a change in a mole's shape or color. ? If you have a mole that is larger than the size of a pencil eraser.  If any of your family members has a history of skin cancer, especially at a young age, talk with your health care provider about genetic screening.  Always use sunscreen. Apply sunscreen liberally and repeatedly throughout the  day.  Whenever you are outside, protect yourself by wearing long sleeves, pants, a wide-brimmed hat, and sunglasses.  What should I know about osteoporosis? Osteoporosis is a condition in which bone destruction happens more quickly than new bone creation. After menopause, you may be at an increased risk for osteoporosis. To help prevent osteoporosis or the bone fractures that can happen because of osteoporosis, the following is recommended:  If you are 74-61 years old, get at least 1,000 mg of calcium and at least 600 mg of vitamin D per day.  If you are older than age 67 but younger than age 38, get at least 1,200 mg of calcium and at least 600 mg of vitamin D per day.  If you are older  than age 71, get at least 1,200 mg of calcium and at least 800 mg of vitamin D per day.  Smoking and excessive alcohol intake increase the risk of osteoporosis. Eat foods that are rich in calcium and vitamin D, and do weight-bearing exercises several times each week as directed by your health care provider. What should I know about how menopause affects my mental health? Depression may occur at any age, but it is more common as you become older. Common symptoms of depression include:  Low or sad mood.  Changes in sleep patterns.  Changes in appetite or eating patterns.  Feeling an overall lack of motivation or enjoyment of activities that you previously enjoyed.  Frequent crying spells.  Talk with your health care provider if you think that you are experiencing depression. What should I know about immunizations? It is important that you get and maintain your immunizations. These include:  Tetanus, diphtheria, and pertussis (Tdap) booster vaccine.  Influenza every year before the flu season begins.  Pneumonia vaccine.  Shingles vaccine.  Your health care provider may also recommend other immunizations. This information is not intended to replace advice given to you by your health care provider.  Make sure you discuss any questions you have with your health care provider. Document Released: 01/01/2006 Document Revised: 05/29/2016 Document Reviewed: 08/13/2015 Elsevier Interactive Patient Education  2018 [employer].

## 2018-09-15 ENCOUNTER — Other Ambulatory Visit: Payer: Self-pay | Admitting: *Deleted

## 2018-09-29 DIAGNOSIS — H25093 Other age-related incipient cataract, bilateral: Secondary | ICD-10-CM | POA: Diagnosis not present

## 2018-10-03 ENCOUNTER — Inpatient Hospital Stay: Payer: PPO

## 2018-10-03 ENCOUNTER — Other Ambulatory Visit: Payer: Self-pay | Admitting: Family

## 2018-10-03 ENCOUNTER — Encounter: Payer: Self-pay | Admitting: Family

## 2018-10-03 ENCOUNTER — Inpatient Hospital Stay: Payer: PPO | Attending: Family | Admitting: Family

## 2018-10-03 DIAGNOSIS — D680 Von Willebrand's disease: Secondary | ICD-10-CM

## 2018-10-03 DIAGNOSIS — D696 Thrombocytopenia, unspecified: Secondary | ICD-10-CM | POA: Diagnosis not present

## 2018-10-03 DIAGNOSIS — Z79899 Other long term (current) drug therapy: Secondary | ICD-10-CM | POA: Diagnosis not present

## 2018-10-03 DIAGNOSIS — D68020 Von Willebrand disease, type 2a: Secondary | ICD-10-CM

## 2018-10-03 LAB — CBC WITH DIFFERENTIAL (CANCER CENTER ONLY)
Abs Immature Granulocytes: 0.03 10*3/uL (ref 0.00–0.07)
BASOS PCT: 1 %
Basophils Absolute: 0.1 10*3/uL (ref 0.0–0.1)
Eosinophils Absolute: 0.2 10*3/uL (ref 0.0–0.5)
Eosinophils Relative: 2 %
HEMATOCRIT: 40.2 % (ref 36.0–46.0)
Hemoglobin: 12.8 g/dL (ref 12.0–15.0)
Immature Granulocytes: 0 %
Lymphocytes Relative: 23 %
Lymphs Abs: 2.1 10*3/uL (ref 0.7–4.0)
MCH: 28.4 pg (ref 26.0–34.0)
MCHC: 31.8 g/dL (ref 30.0–36.0)
MCV: 89.1 fL (ref 80.0–100.0)
MONO ABS: 0.7 10*3/uL (ref 0.1–1.0)
MONOS PCT: 7 %
NEUTROS ABS: 6.3 10*3/uL (ref 1.7–7.7)
Neutrophils Relative %: 67 %
PLATELETS: 82 10*3/uL — AB (ref 150–400)
RBC: 4.51 MIL/uL (ref 3.87–5.11)
RDW: 13.6 % (ref 11.5–15.5)
WBC Count: 9.3 10*3/uL (ref 4.0–10.5)
nRBC: 0 % (ref 0.0–0.2)

## 2018-10-03 LAB — PLATELET BY CITRATE

## 2018-10-03 LAB — SAVE SMEAR (SSMR)

## 2018-10-03 LAB — APTT: APTT: 35 s (ref 24–36)

## 2018-10-03 NOTE — Progress Notes (Signed)
Hematology and Oncology Follow Up Visit  Meredith Dominguez [id number] [birth year] 67 y.o. 10/03/2018   Principle Diagnosis:  Type IIA Von Willebrand disease Thrombocytopenia  Current Therapy:   Observation   Interim History:  Meredith Dominguez is here today for follow-up. She is leaving for [place] at the end of the week and wanted to have her levels checked before hand.  She has started seeing a homeopathic provider and is now taking multiple herbal supplements to help improve her platelet count and prevent bleeding with VWD.  She feels that this has helped quite a bit.  She bit the inside of her cheek and had no issues with bleeding. She does note that she bruises easily but not in excess.  She has had no issues with infection. No fever, chills, n/v, cough, rash, dizziness, SOB, chest pain, palpitations, abdominal pain or changes in bowel or bladder habits.  No swelling, tenderness, numbness or tingling in her extremities.  No lymphadenopathy noted on exam.  She has maintained a good appetite and is staying well hydrated. Her weight is stable.   ECOG Performance Status: 1 - Symptomatic but completely ambulatory  Medications:  Allergies as of 10/03/2018      Reactions   Gluten Meal Other (See Comments)   sensitivity   Monosodium Glutamate Other (See Comments)   Hot and feels bad   Other Other (See Comments)   Red places   Sesame Oil Rash   Turns bright, ear and anus open and close.   Tomato Other (See Comments)   Other night shade vegetables like eggplant, potato   Wheat Bran Other (See Comments)   Stomach issues   Sulfa Antibiotics Rash      Medication List        Accurate as of 10/03/18  3:45 PM. Always use your most recent med list.          CHLOROPHYLL PO Take by mouth.   Cholecalciferol 1.25 MG (50000 UT) Tabs 2 drops per day   desmopressin 0.01 % Soln Commonly known as:  DDAVP   desmopressin 0.01 % solution Commonly known as:  DDAVP NASAL Place 2 sprays (20  mcg total) into the nose daily as needed. Nose bleeds   magnesium 30 MG tablet Take 30 mg by mouth 2 (two) times daily.   NUTRITIONAL SUPPLEMENT PO Take by mouth.   OVER THE COUNTER MEDICATION Take 1 tablet by mouth 2 (two) times daily.   PROBIOTIC PRODUCT PO Take by mouth.   Sesame Oil Oil by Does not apply route.   UNABLE TO FIND Take by mouth.   UNABLE TO FIND 2 (two) times daily. Homeopathic Penicillin   UNABLE TO FIND 3 (three) times a day as needed. BioActive Nutritional V-HP   UNABLE TO FIND 3 (three) times a day. BioActive Homepathic Immuno Fortifier   UNABLE TO FIND Paranorm   UNABLE TO FIND Take by mouth.   UNABLE TO FIND Take by mouth.   UNABLE TO FIND Okra pepcin   UNABLE TO FIND Spanish radish   UNABLE TO FIND Cal-ma   UNABLE TO FIND daily. allerplex 7 - 2 caps daily   UNABLE TO FIND panethenics  Daily   UNABLE TO FIND lym- immune, calming, sleep nes   UNABLE TO FIND Aldrenal Tonic   UNABLE TO FIND anatone   UNABLE TO FIND daily. Gabatone extra   UNABLE TO FIND daily. Hydroside B12 folic acid   UNABLE TO FIND Colloidal Silver   UNABLE TO FIND Take by  mouth.   UNABLE TO FIND Candida formula   UNABLE TO FIND as needed. Zymex   UNABLE TO FIND Take by mouth.   vitamin B-12 500 MCG tablet Commonly known as:  CYANOCOBALAMIN Take 500 mcg by mouth daily.   Vitamin D3 100000 UNIT/GM Powd 2 drops per day       Allergies:  Allergies  Allergen Reactions  . Gluten Meal Other (See Comments)    sensitivity  . Monosodium Glutamate Other (See Comments)    Hot and feels bad  . Other Other (See Comments)    Red places  . Sesame Oil Rash    Turns bright, ear and anus open and close.  . Tomato Other (See Comments)    Other night shade vegetables like eggplant, potato  . Wheat Bran Other (See Comments)    Stomach issues  . Sulfa Antibiotics Rash    Past Medical History, Surgical history, Social history, and Family  History were reviewed and updated.  Review of Systems: All other 10 point review of systems is negative.   Physical Exam:  vitals were not taken for this visit.   Wt Readings from Last 3 Encounters:  04/29/18 158 lb 4.8 oz (71.8 kg)  07/16/17 150 lb (68 kg)  05/25/17 149 lb 9.6 oz (67.9 kg)    Ocular: Sclerae unicteric, pupils equal, round and reactive to light Ear-nose-throat: Oropharynx clear, dentition fair Lymphatic: No cervical, supraclavicular or axillary adenopathy Lungs no rales or rhonchi, good excursion bilaterally Heart regular rate and rhythm, no murmur appreciated Abd soft, nontender, positive bowel sounds, no liver or spleen tip palpated on exam, no fluid wave  MSK no focal spinal tenderness, no joint edema Neuro: non-focal, well-oriented, appropriate affect Breasts: Deferred   Lab Results  Component Value Date   WBC 9.3 10/03/2018   HGB 12.8 10/03/2018   HCT 40.2 10/03/2018   MCV 89.1 10/03/2018   PLT 82 (L) 10/03/2018   No results found for: FERRITIN, IRON, TIBC, UIBC, IRONPCTSAT Lab Results  Component Value Date   RBC 4.51 10/03/2018   No results found for: KPAFRELGTCHN, LAMBDASER, KAPLAMBRATIO No results found for: IGGSERUM, IGA, IGMSERUM No results found for: Meredith Dominguez, SPEI   Chemistry      Component Value Date/Time   NA 138 02/10/2017 1144   K 4.0 02/10/2017 1144   CL 103 02/10/2017 1144   CO2 28 02/10/2017 1144   BUN 23 02/10/2017 1144   CREATININE 0.90 02/10/2017 1144      Component Value Date/Time   CALCIUM 9.7 02/10/2017 1144   ALKPHOS 52 02/10/2017 1144   AST 18 02/10/2017 1144   ALT 12 02/10/2017 1144   BILITOT 0.6 02/10/2017 1144       Impression and Plan: Meredith Dominguez is a a very pleasant 67 yo caucasian female with history of type IIA Von Willebrand disease as well as thrombocytopenia.  She is doing well and has no complaints at this time.  We will see what her VW panel  shows and determine if she needs DDAVP nasal spray.  We will continue to follow along with her as needed.  She will contact our office with any questions or concerns. We can certainly see her any time she needs [place].   Emeline Gins, NP 11/11/20193:45 PM

## 2018-10-04 ENCOUNTER — Telehealth: Payer: Self-pay | Admitting: Hematology & Oncology

## 2018-10-04 NOTE — Telephone Encounter (Signed)
Follows up as needed.per 11/11 los

## 2018-10-05 LAB — COAG STUDIES INTERP REPORT

## 2018-10-05 LAB — VON WILLEBRAND PANEL
Coagulation Factor VIII: 60 % (ref 56–140)
VON WILLEBRAND ANTIGEN, PLASMA: 70 % (ref 50–200)

## 2018-11-09 ENCOUNTER — Ambulatory Visit (INDEPENDENT_AMBULATORY_CARE_PROVIDER_SITE_OTHER): Payer: PPO | Admitting: Family Medicine

## 2018-11-09 ENCOUNTER — Encounter: Payer: Self-pay | Admitting: Family Medicine

## 2018-11-09 ENCOUNTER — Ambulatory Visit (HOSPITAL_BASED_OUTPATIENT_CLINIC_OR_DEPARTMENT_OTHER)
Admission: RE | Admit: 2018-11-09 | Discharge: 2018-11-09 | Disposition: A | Discharge status: Home / Self Care | Payer: PPO | Source: Ambulatory Visit | Attending: Family Medicine | Admitting: Family Medicine

## 2018-11-09 VITALS — BP 110/72 | HR 70 | Resp 16 | Ht 66.5 in | Wt 149.0 lb

## 2018-11-09 DIAGNOSIS — Z111 Encounter for screening for respiratory tuberculosis: Secondary | ICD-10-CM | POA: Diagnosis not present

## 2018-11-09 NOTE — Patient Instructions (Signed)
You can come and pick up your form tomorrow -  I will have the chest film back and will complete your form

## 2018-11-09 NOTE — Progress Notes (Signed)
[facility] [address] [place], [place] [address] [phone]  Date:  11/09/2018   Name:  Meredith Dominguez   DOB:  08/21/1951   MRN:  [id number]  PCP:  Pearline Cables, MD    Chief Complaint: Work form   History of Present Illness:  Meredith Dominguez is a 67 y.o. 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 She needs to have a CXR to rule out TB  She has with her a brief form for me to complete.  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  Overall Meredith Dominguez is otherwise doing well, and has no complaints today.   Patient Active Problem List   Diagnosis Date Noted  . Pharyngitis 05/25/2017  . Von Willebrand disease, type IIa (HCC) 07/29/2016  . Acute blood loss anemia 07/29/2016  . Thrombocytopenia (HCC) 07/29/2016  . Bleeding gums     Past Medical History:  Diagnosis Date  . Anemia   . Pharyngitis 05/25/2017  . Von Willebrand disease (HCC)     Past Surgical History:  Procedure Laterality Date  . BREAST SURGERY     Breast Biopsy Left x2    Social History   Tobacco Use  . Smoking status: Never Smoker  . Smokeless tobacco: Never Used  Substance Use Topics  . Alcohol use: Yes    Comment: wine once per week  . Drug use: No    Family History  Problem Relation Age of Onset  . Leukemia Cousin   . Cancer Mother   . Diabetes Father   . Heart disease Father   . Bleeding Disorder Neg Hx     Allergies  Allergen Reactions  . Gluten Meal Other (See Comments)    sensitivity  . Monosodium Glutamate Other (See Comments)    Hot and feels bad  . Other Other (See Comments)    Red  places  . Sesame Oil Rash    Turns bright, ear and anus open and close.  . Tomato Other (See Comments)    Other night shade vegetables like eggplant, potato  . Wheat Bran Other (See Comments)    Stomach issues  . Sulfa Antibiotics Rash    Medication list has been reviewed and updated.  Current Outpatient Medications on File Prior to Visit  Medication Sig Dispense Refill  . CHLOROPHYLL PO Take by mouth.    . Cholecalciferol (VITAMIN D3) 100000 UNIT/GM POWD 2 drops per day    . Cholecalciferol 1.25 MG (50000 UT) TABS 2 drops per day    . desmopressin (DDAVP NASAL) 0.01 % solution Place 2 sprays (20 mcg total) into the nose daily as needed. Nose bleeds 5 mL 12  . desmopressin (DDAVP) 0.01 % SOLN   10  . magnesium 30 MG tablet Take 30 mg by mouth 2 (two) times daily.    . Nutritional Supplements (NUTRITIONAL SUPPLEMENT PO) Take by mouth.    Marland Kitchen OVER THE COUNTER MEDICATION Take 1 tablet by mouth 2 (two) times daily.    Marland Kitchen PROBIOTIC PRODUCT PO Take by mouth.    . Sesame  Oil OIL by Does not apply route.    Marland Kitchen. UNABLE TO FIND Take by mouth.    Marland Kitchen. UNABLE TO FIND 2 (two) times daily. Homeopathic Penicillin    . UNABLE TO FIND 3 (three) times a day as needed. BioActive Nutritional V-HP    . UNABLE TO FIND 3 (three) times a day. BioActive Homepathic Immuno Fortifier    . UNABLE TO FIND paraplus    . UNABLE TO FIND Take by mouth.    Marland Kitchen. UNABLE TO FIND Take by mouth.    Marland Kitchen. UNABLE TO FIND Okra pepcin    . UNABLE TO FIND Cal-ma    . UNABLE TO FIND daily. allerplex 7 - 2 caps daily    . UNABLE TO FIND panethenics  Daily    . UNABLE TO FIND lym- immune, calming, sleep nes    . UNABLE TO FIND Adrenal forte    . UNABLE TO FIND anatone    . UNABLE TO FIND daily. Gabatone extra    . UNABLE TO FIND daily. Hydroside B12 folic acid    . UNABLE TO FIND Colloidal Silver    . UNABLE TO FIND Take by mouth.    Marland Kitchen. UNABLE TO FIND Candida formula    . UNABLE TO FIND as needed. Zymex    . UNABLE TO FIND Take by  mouth.    . vitamin B-12 (CYANOCOBALAMIN) 500 MCG tablet Take 500 mcg by mouth daily.     No current facility-administered medications on file prior to visit.     Review of Systems:  As per HPI- otherwise negative.   Physical Examination: Vitals:   11/09/18 1617  BP: 110/72  Pulse: 70  Resp: 16  SpO2: 100%   Vitals:   11/09/18 1617  Weight: 149 lb (67.6 kg)  Height: 5' 6.5" (1.689 m)   Body mass index is 23.69 kg/m. Ideal Body Weight: Weight in (lb) to have BMI = 25: 156.9  GEN: WDWN, NAD, Non-toxic, A & O x 3 HEENT: Atraumatic, Normocephalic. Neck supple. No masses, No LAD. Ears and Nose: No external deformity. CV: RRR, No M/G/R. No JVD. No thrill. No extra heart sounds. PULM: CTA B, no wheezes, crackles, rhonchi. No retractions. No resp. distress. No accessory muscle use. ABD: S, NT, ND, +BS. No rebound. No HSM. EXTR: No c/c/e NEURO Normal gait.  PSYCH: Normally interactive. Conversant. Not depressed or anxious appearing.  Calm demeanor.    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  She needs a chest x-ray to evaluate for pulmonary tuberculosis today.  Will obtain chest x-ray, and then complete form.  She plans to come in and pick up her form tomorrow.  Jola BabinskiMarilyn does have von Willebrand's disease, however I do not think this will stand in the way of her work as a Human resources officerspeech therapist.  She does tend to have some unusual health believes, but again I believe she is able to serve as a therapist  Signed Abbe AmsterdamJessica Olyvia Gopal, MD

## 2018-11-10 ENCOUNTER — Telehealth: Payer: Self-pay | Admitting: Family Medicine

## 2018-11-10 NOTE — Telephone Encounter (Signed)
Copied from CRM [phone]. Topic: Quick Communication - See Telephone Encounter >> Nov 10, 2018  1:21 PM Arlyss Gandyichardson, Zamoria Boss N, NT wrote: CRM for notification. See Telephone encounter for: 11/10/18. Pt requesting a call from the nurses to discuss her x-ray results. Pt also would like to know when she will be able to get the report along with the form that Dr. Patsy Lageropland is to complete with the x-ray result? Please advise.

## 2018-11-10 NOTE — Telephone Encounter (Signed)
Patient notified and verbalized of normal xray result. Patient coming to pick up form.

## 2019-02-27 ENCOUNTER — Telehealth: Payer: Self-pay

## 2019-02-27 DIAGNOSIS — L918 Other hypertrophic disorders of the skin: Secondary | ICD-10-CM

## 2019-02-27 NOTE — Telephone Encounter (Signed)
Ok, placed referral to derm for her

## 2019-02-27 NOTE — Telephone Encounter (Signed)
Copied from CRM [id number]. Topic: Referral - Request for Referral >> Feb 24, 2019  4:05 PM Lynne Logan D wrote: Has patient seen PCP for this complaint?No *If NO, is insurance requiring patient see PCP for this issue before PCP can refer them? Referral for which specialty: Dermatology Preferred provider/office: In network Reason for referral: Skin tag under bottom / thick and red

## 2019-03-02 DIAGNOSIS — L821 Other seborrheic keratosis: Secondary | ICD-10-CM | POA: Diagnosis not present

## 2019-03-02 DIAGNOSIS — L918 Other hypertrophic disorders of the skin: Secondary | ICD-10-CM | POA: Diagnosis not present

## 2019-04-14 DIAGNOSIS — H903 Sensorineural hearing loss, bilateral: Secondary | ICD-10-CM | POA: Diagnosis not present

## 2019-05-04 NOTE — Progress Notes (Addendum)
Virtual Visit via Video Note  I connected with patient on 05/05/19 at  9:00 AM EDT by audio enabled telemedicine application and verified that I am speaking with the correct person using two identifiers.   THIS ENCOUNTER IS A VIRTUAL VISIT DUE TO COVID-19 - PATIENT WAS NOT SEEN IN THE OFFICE. PATIENT HAS CONSENTED TO VIRTUAL VISIT / TELEMEDICINE VISIT   Location of patient: home  Location of provider: office  I discussed the limitations of evaluation and management by telemedicine and the availability of in person appointments. The patient expressed understanding and agreed to proceed.   Subjective:   Meredith Dominguez is a 68 y.o. female who presents for Medicare Annual (Subsequent) preventive examination.  Review of Systems: No ROS.  Medicare Wellness Virtual Visit.  Visual/audio telehealth visit, UTA vital signs.   See social history for additional risk factors.   Sleep patterns: no issues Home Safety/Smoke Alarms: Feels safe in home. Smoke alarms in place.     Female:   Mammo- declines       Dexa scan-ordered [facility]CS-pt states someone has already issued her a cologuard    Objective:    Advanced Directives 05/05/2019 04/29/2018 08/05/2017 07/16/2017 05/21/2017 04/28/2017 04/06/2017  Does Patient Have a Medical Advance Directive? No No No No No Yes Yes  Type of Advance Directive - - - - - Press photographer;Living will [place];Living will  Copy of [place] in Chart? - - - - - No - copy requested -  Would patient like information on creating a medical advance directive? No - Patient declined Yes (MAU/Ambulatory/Procedural Areas - Information given) - - - - -    Tobacco Social History   Tobacco Use  Smoking Status Never Smoker  Smokeless Tobacco Never Used     Counseling given: Not Answered   Clinical Intake: Pain : No/denies pain    Past Medical History:  Diagnosis Date  . Anemia   . Pharyngitis 05/25/2017  . Von Willebrand  disease ([place])    Past Surgical History:  Procedure Laterality Date  . BREAST SURGERY     Breast Biopsy Left x2   Family History  Problem Relation Age of Onset  . Leukemia Cousin   . Cancer Mother   . Diabetes Father   . Heart disease Father   . Bleeding Disorder Neg Hx    Social History   Socioeconomic History  . Marital status: Divorced    Spouse name: Not on file  . Number of children: Not on file  . Years of education: Not on file  . Highest education level: Not on file  Occupational History  . Not on file  Social Needs  . Financial resource strain: Not on file  . Food insecurity    Worry: Not on file    Inability: Not on file  . Transportation needs    Medical: Not on file    Non-medical: Not on file  Tobacco Use  . Smoking status: Never Smoker  . Smokeless tobacco: Never Used  Substance and Sexual Activity  . Alcohol use: Yes    Comment: wine once per week  . Drug use: No  . Sexual activity: Not on file  Lifestyle  . Physical activity    Days per week: Not on file    Minutes per session: Not on file  . Stress: Not on file  Relationships  . Social Herbalist on phone: Not on file    Gets  together: Not on file    Attends religious service: Not on file    Active member of club or organization: Not on file    Attends meetings of clubs or organizations: Not on file    Relationship status: Not on file  Other Topics Concern  . Not on file  Social History Narrative  . Not on file    Outpatient Encounter Medications as of 05/05/2019  Medication Sig  . CHLOROPHYLL PO Take by mouth.  . Cholecalciferol (VITAMIN D3) 100000 UNIT/GM POWD 2 drops per day  . Cholecalciferol 1.25 MG (50000 UT) TABS 2 drops per day  . desmopressin (DDAVP NASAL) 0.01 % solution Place 2 sprays (20 mcg total) into the nose daily as needed. Nose bleeds  . desmopressin (DDAVP) 0.01 % SOLN   . magnesium 30 MG tablet Take 30 mg by mouth 2 (two) times daily.  . Nutritional  Supplements (NUTRITIONAL SUPPLEMENT PO) Take by mouth.  Marland Kitchen OVER THE COUNTER MEDICATION Take 1 tablet by mouth 2 (two) times daily.  Marland Kitchen PROBIOTIC PRODUCT PO Take by mouth.  . Sesame Oil OIL by Does not apply route.  Marland Kitchen UNABLE TO FIND Take by mouth.  Marland Kitchen UNABLE TO FIND 2 (two) times daily. Homeopathic Penicillin  . UNABLE TO FIND 3 (three) times a day as needed. BioActive Nutritional V-HP  . UNABLE TO FIND 3 (three) times a day. BioActive Homepathic Immuno Fortifier  . UNABLE TO FIND paraplus  . UNABLE TO FIND Take by mouth.  Marland Kitchen UNABLE TO FIND Take by mouth.  Marland Kitchen UNABLE TO FIND Okra pepcin  . UNABLE TO FIND Cal-ma  . UNABLE TO FIND daily. allerplex 7 - 2 caps daily  . UNABLE TO FIND panethenics  Daily  . UNABLE TO FIND lym- immune, calming, sleep nes  . UNABLE TO FIND Adrenal forte  . UNABLE TO FIND anatone  . UNABLE TO FIND daily. Gabatone extra  . UNABLE TO FIND daily. Hydroside N56 folic acid  . UNABLE TO FIND Colloidal Silver  . UNABLE TO FIND Take by mouth.  Marland Kitchen UNABLE TO FIND Candida formula  . UNABLE TO FIND as needed. Zymex  . UNABLE TO FIND Take by mouth.  . vitamin B-12 (CYANOCOBALAMIN) 500 MCG tablet Take 500 mcg by mouth daily.   No facility-administered encounter medications on file as of 05/05/2019.     Activities of Daily Living No flowsheet data found.  Patient Care Team: Copland, Gay Filler, MD as PCP - General (Family Medicine) Hoy Finlay., MD (Hematology)    Assessment:   This is a routine wellness examination for Meredith Dominguez. Physical assessment deferred to PCP.  Exercise Activities and Dietary recommendations   Diet (meal preparation, eat out, water intake, caffeinated beverages, dairy products, fruits and vegetables): eats very healthy 3x/day   Goals    . Restart tai chi (pt-stated)    . Take a long vacation as planned.       Fall Risk Fall Risk  05/05/2019 04/29/2018 04/28/2017  Falls in the past year? 1 No No  Number falls in past yr: 0 - -  Injury with  Fall? 0 - -    Depression Screen PHQ 2/9 Scores 05/05/2019 04/29/2018 04/28/2017  PHQ - 2 Score 0 0 0     Cognitive Function Ad8 score reviewed for issues:  Issues making decisions:no  Less interest in hobbies / activities:no  Repeats questions, stories (family complaining):no  Trouble using ordinary gadgets (microwave, computer, phone):no  Forgets the month or year:  no  Mismanaging finances: no  Remembering appts:no  Daily problems with thinking and/or memory:no Ad8 score is=0         Immunization History  Administered Date(s) Administered  . Hepatitis B, ped/adol 09/30/1998, 11/04/1998, 04/07/1999  . PPD Test 07/15/2016  . Td 02/14/1996  . Tdap 02/07/2009    Screening Tests Health Maintenance  Topic Date Due  . MAMMOGRAM  01/26/1999  . COLONOSCOPY  01/26/1999  . DEXA SCAN  01/25/2014  . PNA vac Low Risk Adult (1 of 2 - PCV13) 01/25/2014  . TETANUS/TDAP  02/08/2019  . INFLUENZA VACCINE  06/24/2019  . Hepatitis C Screening  Completed       Plan:   See you next year!  Continue to eat heart healthy diet (full of fruits, vegetables, whole grains, lean protein, water--limit salt, fat, and sugar intake) and increase physical activity as tolerated.  Continue doing brain stimulating activities (puzzles, reading, adult coloring books, staying active) to keep memory sharp.   I have ordered your bone density scan.  I have personally reviewed and noted the following in the patient's chart:   . Medical and social history . Use of alcohol, tobacco or illicit drugs  . Current medications and supplements . Functional ability and status . Nutritional status . Physical activity . Advanced directives . List of other physicians . Hospitalizations, surgeries, and ER visits in previous 12 months . Vitals . Screenings to include cognitive, depression, and falls . Referrals and appointments  In addition, I have reviewed and discussed with patient certain preventive  protocols, quality metrics, and best practice recommendations. A written personalized care plan for preventive services as well as general preventive health recommendations were provided to patient.     Shela Nevin, [place]  05/05/2019    I have reviewed the above MWE note by Ms. Vevelyn Royals and agree with her documentation j Copland MD

## 2019-05-05 ENCOUNTER — Encounter: Payer: Self-pay | Admitting: *Deleted

## 2019-05-05 ENCOUNTER — Ambulatory Visit (INDEPENDENT_AMBULATORY_CARE_PROVIDER_SITE_OTHER): Payer: PPO | Admitting: *Deleted

## 2019-05-05 ENCOUNTER — Ambulatory Visit: Payer: PPO | Admitting: *Deleted

## 2019-05-05 ENCOUNTER — Other Ambulatory Visit: Payer: Self-pay

## 2019-05-05 DIAGNOSIS — Z0000 Encounter for general adult medical examination without abnormal findings: Secondary | ICD-10-CM | POA: Diagnosis not present

## 2019-05-05 DIAGNOSIS — Z780 Asymptomatic menopausal state: Secondary | ICD-10-CM

## 2019-05-05 NOTE — Patient Instructions (Addendum)
See you next year!  Continue to eat heart healthy diet (full of fruits, vegetables, whole grains, lean protein, water--limit salt, fat, and sugar intake) and increase physical activity as tolerated.  Continue doing brain stimulating activities (puzzles, reading, adult coloring books, staying active) to keep memory sharp.   I have ordered your bone density scan.  Meredith Dominguez , Thank you for taking time to come for your Medicare Wellness Visit. I appreciate your ongoing commitment to your health goals. Please review the following plan we discussed and let me know if I can assist you in the future.   These are the goals we discussed: Goals    . Restart tai chi (pt-stated)    . Take a long vacation as planned.       This is a list of the screening recommended for you and due dates:  Health Maintenance  Topic Date Due  . Mammogram  01/26/1999  . Colon Cancer Screening  01/26/1999  . DEXA scan (bone density measurement)  01/25/2014  . Pneumonia vaccines (1 of 2 - PCV13) 01/25/2014  . Tetanus Vaccine  02/08/2019  . Flu Shot  06/24/2019  .  Hepatitis C: One time screening is recommended by Center for Disease Control  (CDC) for  adults born from [birth year] through 1967.   Completed    Health Maintenance After Age 22 After age 92, you are at a higher risk for certain long-term diseases and infections as well as injuries from falls. Falls are a major cause of broken bones and head injuries in people who are older than age 76. Getting regular preventive care can help to keep you healthy and well. Preventive care includes getting regular testing and making lifestyle changes as recommended by your health care provider. Talk with your health care provider about:  Which screenings and tests you should have. A screening is a test that checks for a disease when you have no symptoms.  A diet and exercise plan that is right for you. What should I know about screenings and tests to prevent falls? Screening  and testing are the best ways to find a health problem early. Early diagnosis and treatment give you the best chance of managing medical conditions that are common after age 51. Certain conditions and lifestyle choices may make you more likely to have a fall. Your health care provider may recommend:  Regular vision checks. Poor vision and conditions such as cataracts can make you more likely to have a fall. If you wear glasses, make sure to get your prescription updated if your vision changes.  Medicine review. Work with your health care provider to regularly review all of the medicines you are taking, including over-the-counter medicines. Ask your health care provider about any side effects that may make you more likely to have a fall. Tell your health care provider if any medicines that you take make you feel dizzy or sleepy.  Osteoporosis screening. Osteoporosis is a condition that causes the bones to get weaker. This can make the bones weak and cause them to break more easily.  Blood pressure screening. Blood pressure changes and medicines to control blood pressure can make you feel dizzy.  Strength and balance checks. Your health care provider may recommend certain tests to check your strength and balance while standing, walking, or changing positions.  Foot health exam. Foot pain and numbness, as well as not wearing proper footwear, can make you more likely to have a fall.  Depression screening. You may be  more likely to have a fall if you have a fear of falling, feel emotionally low, or feel unable to do activities that you used to do.  Alcohol use screening. Using too much alcohol can affect your balance and may make you more likely to have a fall. What actions can I take to lower my risk of falls? General instructions  Talk with your health care provider about your risks for falling. Tell your health care provider if: ? You fall. Be sure to tell your health care provider about all  falls, even ones that seem minor. ? You feel dizzy, sleepy, or off-balance.  Take over-the-counter and prescription medicines only as told by your health care provider. These include any supplements.  Eat a healthy diet and maintain a healthy weight. A healthy diet includes low-fat dairy products, low-fat (lean) meats, and fiber from whole grains, beans, and lots of fruits and vegetables. Home safety  Remove any tripping hazards, such as rugs, cords, and clutter.  Install safety equipment such as grab bars in bathrooms and safety rails on stairs.  Keep rooms and walkways well-lit. Activity   Follow a regular exercise program to stay fit. This will help you maintain your balance. Ask your health care provider what types of exercise are appropriate for you.  If you need a cane or walker, use it as recommended by your health care provider.  Wear supportive shoes that have nonskid soles. Lifestyle  Do not drink alcohol if your health care provider tells you not to drink.  If you drink alcohol, limit how much you have: ? 0-1 drink a day for women. ? 0-2 drinks a day for men.  Be aware of how much alcohol is in your drink. In the [place] one drink equals one typical bottle of beer (12 oz), one-half glass of wine (5 oz), or one shot of hard liquor (1 oz).  Do not use any products that contain nicotine or tobacco, such as cigarettes and e-cigarettes. If you need help quitting, ask your health care provider. Summary  Having a healthy lifestyle and getting preventive care can help to protect your health and wellness after age 16.  Screening and testing are the best way to find a health problem early and help you avoid having a fall. Early diagnosis and treatment give you the best chance for managing medical conditions that are more common for people who are older than age 1.  Falls are a major cause of broken bones and head injuries in people who are older than age 22. Take precautions to  prevent a fall at home.  Work with your health care provider to learn what changes you can make to improve your health and wellness and to prevent falls. This information is not intended to replace advice given to you by your health care provider. Make sure you discuss any questions you have with your health care provider. Document Released: 09/22/2017 Document Revised: 09/22/2017 Document Reviewed: 09/22/2017 Elsevier Interactive Patient Education  2019 [employer].

## 2019-05-06 NOTE — Progress Notes (Addendum)
[facility] [address], [address] [place], [place] [address] [phone] [phone]  Date:  05/08/2019   Name:  Meredith ArisMarilyn Stanhope   DOB:  06/21/1951   MRN:  [id number]  PCP:  Pearline C[place]opland, Capers Hagmann C, MD    Chief Complaint: Von Willebrand Disease (6 month follow up)   History of Present Illness:  Meredith Dominguez is a 68 y.o. very pleasant female patient who presents with the following:  Periodic recheck visit for this pt with history of von willebrand disease  She sees hematology. 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  She did end up doing some speech therapy, even some online therapy  She is done for the summer but does plan to work next year as well   Mammo:offered to set up for her but she declines  Colon: she has cologuard on the way per RN Dexa:  This is ordered already  Immun:  She declines all today  Labs:  November per hematology  She is trying out a hearing aid  Patient Active Problem List   Diagnosis Date Noted  . Pharyngitis 05/25/2017  . Von Willebrand disease, type IIa (HCC) 07/29/2016  . Acute blood loss anemia 07/29/2016  . Thrombocytopenia (HCC) 07/29/2016  . Bleeding gums     Past Medical History:  Diagnosis Date  . Anemia   . Pharyngitis 05/25/2017  . Von Willebrand disease (HCC)     Past Surgical History:  Procedure Laterality Date  . BREAST SURGERY     Breast Biopsy Left x2    Social History   Tobacco Use  . Smoking status: Never Smoker  . Smokeless tobacco: Never Used  Substance Use Topics  . Alcohol use: Yes    Comment: wine once per week  . Drug use: No    Family History  Problem Relation Age of Onset  . Leukemia Cousin   . Cancer Mother   . Diabetes Father   . Heart disease Father   . Bleeding Disorder Neg Hx     Allergies  Allergen Reactions  . Gluten Meal Other (See  Comments)    sensitivity  . Monosodium Glutamate Other (See Comments)    Hot and feels bad  . Other Other (See Comments)    Red places  . Sesame Oil Rash    Turns bright, ear and anus open and close.  . Tomato Other (See Comments)    Other night shade vegetables like eggplant, potato  . Wheat Bran Other (See Comments)    Stomach issues  . Sulfa Antibiotics Rash    Medication list has been reviewed and updated.  Current Outpatient Medications on File Prior to Visit  Medication Sig Dispense Refill  . CHLOROPHYLL PO Take by mouth.    . Cholecalciferol (VITAMIN D3) 100000 UNIT/GM POWD 2 drops per day    . Cholecalciferol 1.25 MG (50000 UT) TABS 2 drops per day    . desmopressin (DDAVP NASAL) 0.01 % solution Place 2 sprays (20 mcg total) into the nose daily as needed. Nose bleeds 5 mL 12  . desmopressin (DDAVP) 0.01 % SOLN   10  . magnesium 30 MG tablet Take 30 mg by mouth 2 (two) times daily.    . Nutritional Supplements (NUTRITIONAL SUPPLEMENT PO) Take by mouth.    Marland Kitchen. OVER THE COUNTER MEDICATION Take 1 tablet  by mouth 2 (two) times daily.    Marland Kitchen PROBIOTIC PRODUCT PO Take by mouth.    . Sesame Oil OIL by Does not apply route.    Marland Kitchen UNABLE TO FIND Take by mouth.    Marland Kitchen UNABLE TO FIND 2 (two) times daily. Homeopathic Penicillin    . UNABLE TO FIND 3 (three) times a day as needed. BioActive Nutritional V-HP    . UNABLE TO FIND 3 (three) times a day. BioActive Homepathic Immuno Fortifier    . UNABLE TO FIND paraplus    . UNABLE TO FIND Take by mouth.    Marland Kitchen UNABLE TO FIND Take by mouth.    Marland Kitchen UNABLE TO FIND Okra pepcin    . UNABLE TO FIND Cal-ma    . UNABLE TO FIND daily. allerplex 7 - 2 caps daily    . UNABLE TO FIND panethenics  Daily    . UNABLE TO FIND lym- immune, calming, sleep nes    . UNABLE TO FIND Adrenal forte    . UNABLE TO FIND anatone    . UNABLE TO FIND daily. Gabatone extra    . UNABLE TO FIND daily. Hydroside X21 folic acid    . UNABLE TO FIND Colloidal Silver    .  UNABLE TO FIND Take by mouth.    Marland Kitchen UNABLE TO FIND Candida formula    . UNABLE TO FIND as needed. Zymex    . UNABLE TO FIND Take by mouth.    . vitamin B-12 (CYANOCOBALAMIN) 500 MCG tablet Take 500 mcg by mouth daily.     No current facility-administered medications on file prior to visit.     Review of Systems:  As per HPI- otherwise negative. She is generally pretty physically active and does well No CP or SOB  Physical Examination: Vitals:   05/08/19 1124  BP: 118/72  Pulse: 81  Resp: 16  Temp: (!) 97.5 F (36.4 C)  SpO2: 98%   Vitals:   05/08/19 1124  Weight: 150 lb (68 kg)  Height: 5' 6.5" (1.689 m)   Body mass index is 23.85 kg/m. Ideal Body Weight: Weight in (lb) to have BMI = 25: 156.9  GEN: WDWN, NAD, Non-toxic, A & O x 3, normal weight, looks well  HEENT: Atraumatic, Normocephalic. Neck supple. No masses, No LAD. She is wearing hearing aids which are helpful for her Bilateral TM wnl, oropharynx normal.  PEERL,EOMI.   Ears and Nose: No external deformity. CV: RRR, No M/G/R. No JVD. No thrill. No extra heart sounds. PULM: CTA B, no wheezes, crackles, rhonchi. No retractions. No resp. distress. No accessory muscle use. ABD: S, NT, ND, +BS. No rebound. No HSM. EXTR: No c/c/e NEURO Normal gait.  PSYCH: Normally interactive. Conversant. Not depressed or anxious appearing.  Calm demeanor.    Assessment and Plan:   ICD-10-CM   1. Screening for hyperlipidemia  Z13.220 Lipid panel  2. Screening for diabetes mellitus  Z13.1 Comprehensive metabolic panel    Hemoglobin A1c  3. Von Willebrand disease ([place])  D68.0 CBC  4. Sensorineural hearing loss (SNHL) of both ears  H90.3    Routine follow-up visit today Labs pending Went over health maint- she declines mammo and vaccinations but is planning a bone density and cologuard  She is using hearing aids and doing well with these   Follow-up: No follow-ups on file.  No orders of the defined types were placed in this  encounter.  Orders Placed This Encounter  Procedures  .  CBC  . Comprehensive metabolic panel  . Hemoglobin A1c  . Lipid panel       Signed Abbe AmsterdamJessica Ralynn San, MD  Received her labs,  Message to pt   Results for orders placed or performed in visit on 05/08/19  CBC  Result Value Ref Range   WBC 7.4 4.0 - 10.5 K/uL   RBC 4.81 3.87 - 5.11 Mil/uL   Platelets 73.0 (L) 150.0 - 400.0 K/uL   Hemoglobin 13.7 12.0 - 15.0 g/dL   HCT 45.441.2 09.836.0 - 11.946.0 %   MCV 85.5 78.0 - 100.0 fl   MCHC 33.4 30.0 - 36.0 g/dL   RDW 14.715.3 82.911.5 - 56.215.5 %  Comprehensive metabolic panel  Result Value Ref Range   Sodium 137 135 - 145 mEq/L   Potassium 4.3 3.5 - 5.1 mEq/L   Chloride 105 96 - 112 mEq/L   CO2 24 19 - 32 mEq/L   Glucose, Bld 74 70 - 99 mg/dL   BUN 22 6 - 23 mg/dL   Creatinine, Ser 1.300.85 0.40 - 1.20 mg/dL   Total Bilirubin 0.5 0.2 - 1.2 mg/dL   Alkaline Phosphatase 68 39 - 117 U/L   AST 17 0 - 37 U/L   ALT 10 0 - 35 U/L   Total Protein 6.6 6.0 - 8.3 g/dL   Albumin 4.1 3.5 - 5.2 g/dL   Calcium 9.1 8.4 - 86.510.5 mg/dL   GFR 78.4666.07 >96.29>60.00 mL/min  Hemoglobin A1c  Result Value Ref Range   Hgb A1c MFr Bld 5.4 4.6 - 6.5 %  Lipid panel  Result Value Ref Range   Cholesterol 227 (H) 0 - 200 mg/dL   Triglycerides 528.4104.0 0.0 - 149.0 mg/dL   HDL 13.2459.80 >40.10>39.00 mg/dL   VLDL 27.220.8 0.0 - 53.640.0 mg/dL   LDL Cholesterol 644147 (H) 0 - 99 mg/dL   Total CHOL/HDL Ratio 4    NonHDL 167.64

## 2019-05-08 ENCOUNTER — Encounter: Payer: Self-pay | Admitting: Family Medicine

## 2019-05-08 ENCOUNTER — Ambulatory Visit (INDEPENDENT_AMBULATORY_CARE_PROVIDER_SITE_OTHER): Payer: PPO | Admitting: Family Medicine

## 2019-05-08 ENCOUNTER — Other Ambulatory Visit: Payer: Self-pay

## 2019-05-08 VITALS — BP 118/72 | HR 81 | Temp 97.5°F | Resp 16 | Ht 66.5 in | Wt 150.0 lb

## 2019-05-08 DIAGNOSIS — Z131 Encounter for screening for diabetes mellitus: Secondary | ICD-10-CM | POA: Diagnosis not present

## 2019-05-08 DIAGNOSIS — H903 Sensorineural hearing loss, bilateral: Secondary | ICD-10-CM | POA: Diagnosis not present

## 2019-05-08 DIAGNOSIS — Z13220 Encounter for screening for lipoid disorders: Secondary | ICD-10-CM

## 2019-05-08 DIAGNOSIS — D6800 Von Willebrand disease, unspecified: Secondary | ICD-10-CM

## 2019-05-08 LAB — LIPID PANEL
Cholesterol: 227 mg/dL — ABNORMAL HIGH (ref 0–200)
HDL: 59.8 mg/dL (ref >39.00)
LDL Cholesterol: 147 mg/dL — ABNORMAL HIGH (ref 0–99)
NonHDL: 167.64
Total CHOL/HDL Ratio: 4
Triglycerides: 104 mg/dL (ref 0.0–149.0)
VLDL: 20.8 mg/dL (ref 0.0–40.0)

## 2019-05-08 LAB — CBC
HCT: 41.2 % (ref 36.0–46.0)
Hemoglobin: 13.7 g/dL (ref 12.0–15.0)
MCHC: 33.4 g/dL (ref 30.0–36.0)
MCV: 85.5 fl (ref 78.0–100.0)
Platelets: 73 10*3/uL — ABNORMAL LOW (ref 150.0–400.0)
RBC: 4.81 Mil/uL (ref 3.87–5.11)
RDW: 15.3 % (ref 11.5–15.5)
WBC: 7.4 10*3/uL (ref 4.0–10.5)

## 2019-05-08 LAB — COMPREHENSIVE METABOLIC PANEL
ALT: 10 U/L (ref 0–35)
AST: 17 U/L (ref 0–37)
Albumin: 4.1 g/dL (ref 3.5–5.2)
Alkaline Phosphatase: 68 U/L (ref 39–117)
BUN: 22 mg/dL (ref 6–23)
CO2: 24 mEq/L (ref 19–32)
Calcium: 9.1 mg/dL (ref 8.4–10.5)
Chloride: 105 mEq/L (ref 96–112)
Creatinine, Ser: 0.85 mg/dL (ref 0.40–1.20)
GFR: 66.07 mL/min (ref >60.00)
Glucose, Bld: 74 mg/dL (ref 70–99)
Potassium: 4.3 mEq/L (ref 3.5–5.1)
Sodium: 137 mEq/L (ref 135–145)
Total Bilirubin: 0.5 mg/dL (ref 0.2–1.2)
Total Protein: 6.6 g/dL (ref 6.0–8.3)

## 2019-05-08 LAB — HEMOGLOBIN A1C: Hgb A1c MFr Bld: 5.4 % (ref 4.6–6.5)

## 2019-05-08 NOTE — Patient Instructions (Addendum)
It was good to see you today- take care and I will be in touch with your labs asap You should get an appointment for your bone density scan and a Cologuard kit at home.  Let me know if either of these does not happen as expected. I hope that you continue to do well with your hearing aids  Assuming all is well let's recheck in about 6 months

## 2019-05-12 ENCOUNTER — Other Ambulatory Visit: Payer: Self-pay

## 2019-05-12 ENCOUNTER — Ambulatory Visit (HOSPITAL_BASED_OUTPATIENT_CLINIC_OR_DEPARTMENT_OTHER)
Admission: RE | Admit: 2019-05-12 | Discharge: 2019-05-12 | Disposition: A | Discharge status: Home / Self Care | Payer: PPO | Source: Ambulatory Visit | Attending: Family Medicine | Admitting: Family Medicine

## 2019-05-12 DIAGNOSIS — Z780 Asymptomatic menopausal state: Secondary | ICD-10-CM | POA: Diagnosis not present

## 2019-05-12 DIAGNOSIS — M810 Age-related osteoporosis without current pathological fracture: Secondary | ICD-10-CM | POA: Diagnosis not present

## 2019-05-15 ENCOUNTER — Encounter: Payer: Self-pay | Admitting: Family Medicine

## 2019-05-15 DIAGNOSIS — M810 Age-related osteoporosis without current pathological fracture: Secondary | ICD-10-CM

## 2019-05-15 HISTORY — DX: Age-related osteoporosis without current pathological fracture: M81.0

## 2019-09-14 ENCOUNTER — Telehealth: Payer: Self-pay

## 2019-09-14 NOTE — Telephone Encounter (Signed)
Last ov notes mailed to patient for review.

## 2019-09-14 NOTE — Telephone Encounter (Signed)
Copied from [place] [id number]. Topic: General - Other >> Sep 14, 2019 10:41 AM Carolyn Stare wrote: Pt req a copy of her last appt notes

## 2019-09-20 NOTE — Telephone Encounter (Signed)
Pt is calling and she need for social security office the last office note with dr signature and date also  with [place] stamp signature. Pt lost her social security card. Pt would like to pick up last office  And please call pt

## 2019-09-21 NOTE — Telephone Encounter (Signed)
Placed note up front for patient pick up.

## 2019-12-12 ENCOUNTER — Ambulatory Visit (INDEPENDENT_AMBULATORY_CARE_PROVIDER_SITE_OTHER): Payer: PPO | Admitting: Medical

## 2019-12-12 ENCOUNTER — Telehealth: Payer: Self-pay | Admitting: Medical

## 2019-12-12 ENCOUNTER — Ambulatory Visit: Payer: Self-pay | Admitting: *Deleted

## 2019-12-12 ENCOUNTER — Encounter: Payer: Self-pay | Admitting: Medical

## 2019-12-12 ENCOUNTER — Ambulatory Visit (HOSPITAL_BASED_OUTPATIENT_CLINIC_OR_DEPARTMENT_OTHER)
Admission: RE | Admit: 2019-12-12 | Discharge: 2019-12-12 | Disposition: A | Discharge status: Home / Self Care | Payer: PPO | Source: Ambulatory Visit | Attending: Medical | Admitting: Medical

## 2019-12-12 ENCOUNTER — Ambulatory Visit: Payer: PPO | Admitting: Medical

## 2019-12-12 ENCOUNTER — Other Ambulatory Visit: Payer: Self-pay

## 2019-12-12 VITALS — BP 148/90 | HR 109 | Temp 97.6°F | Ht 66.5 in | Wt 153.0 lb

## 2019-12-12 DIAGNOSIS — D696 Thrombocytopenia, unspecified: Secondary | ICD-10-CM

## 2019-12-12 DIAGNOSIS — R109 Unspecified abdominal pain: Secondary | ICD-10-CM

## 2019-12-12 DIAGNOSIS — K921 Melena: Secondary | ICD-10-CM | POA: Diagnosis not present

## 2019-12-12 DIAGNOSIS — D6800 Von Willebrand disease, unspecified: Secondary | ICD-10-CM

## 2019-12-12 LAB — CBC WITH DIFFERENTIAL/PLATELET
Basophils Absolute: 0 10*3/uL (ref 0.0–0.1)
Basophils Relative: 0.5 % (ref 0.0–3.0)
Eosinophils Absolute: 0.1 10*3/uL (ref 0.0–0.7)
Eosinophils Relative: 0.7 % (ref 0.0–5.0)
HCT: 42.6 % (ref 36.0–46.0)
Hemoglobin: 14.1 g/dL (ref 12.0–15.0)
Lymphocytes Relative: 14.7 % (ref 12.0–46.0)
Lymphs Abs: 1.3 10*3/uL (ref 0.7–4.0)
MCHC: 33 g/dL (ref 30.0–36.0)
MCV: 86 fl (ref 78.0–100.0)
Monocytes Absolute: 0.6 10*3/uL (ref 0.1–1.0)
Monocytes Relative: 6.4 % (ref 3.0–12.0)
Neutro Abs: 7 10*3/uL (ref 1.4–7.7)
Neutrophils Relative %: 77.7 % — ABNORMAL HIGH (ref 43.0–77.0)
Platelets: 77 10*3/uL — ABNORMAL LOW (ref 150.0–400.0)
RBC: 4.95 Mil/uL (ref 3.87–5.11)
RDW: 14.2 % (ref 11.5–15.5)
WBC: 9 10*3/uL (ref 4.0–10.5)

## 2019-12-12 LAB — COMPREHENSIVE METABOLIC PANEL
ALT: 13 U/L (ref 0–35)
AST: 20 U/L (ref 0–37)
Albumin: 4.1 g/dL (ref 3.5–5.2)
Alkaline Phosphatase: 70 U/L (ref 39–117)
BUN: 18 mg/dL (ref 6–23)
CO2: 26 mEq/L (ref 19–32)
Calcium: 9.2 mg/dL (ref 8.4–10.5)
Chloride: 105 mEq/L (ref 96–112)
Creatinine, Ser: 0.86 mg/dL (ref 0.40–1.20)
GFR: 65.07 mL/min (ref >60.00)
Glucose, Bld: 100 mg/dL — ABNORMAL HIGH (ref 70–99)
Potassium: 4.2 mEq/L (ref 3.5–5.1)
Sodium: 138 mEq/L (ref 135–145)
Total Bilirubin: 0.5 mg/dL (ref 0.2–1.2)
Total Protein: 6.9 g/dL (ref 6.0–8.3)

## 2019-12-12 LAB — APTT: aPTT: 33.6 s — ABNORMAL HIGH (ref 23.4–32.7)

## 2019-12-12 MED ORDER — CIPROFLOXACIN HCL 500 MG PO TABS: 500.0000 mg | ORAL_TABLET | Freq: Two times a day (BID) | ORAL | 0 refills | Status: DC

## 2019-12-12 MED ORDER — METRONIDAZOLE 500 MG PO TABS: 500.0000 mg | ORAL_TABLET | Freq: Three times a day (TID) | ORAL | 0 refills | Status: DC

## 2019-12-12 NOTE — Telephone Encounter (Signed)
Patient has been controlling bleeding with supplements patient had gas yesterday-patient strained to move bowels- diarrhea and blood with BM. Patient reports no blood this morning- stool very light. T 97.6- but hands and feet are cold. Patient is concerned about bleeding and blood levels. Call to office for appointment.   Reason for Disposition . MILD rectal bleeding (more than just a few drops or streaks)  Answer Assessment - Initial Assessment Questions 1. APPEARANCE of BLOOD: "What color is it?" "Is it passed separately, on the surface of the stool, or mixed in with the stool?"      Blood with stool last night 2. AMOUNT: "How much blood was passed?"      Clot on paper- turned water red 3. FREQUENCY: "How many times has blood been passed with the stools?"      Only once 4. ONSET: "When was the blood first seen in the stools?" (Days or weeks)      11:30 5. DIARRHEA: "Is there also some diarrhea?" If so, ask: "How many diarrhea stools were passed in past 24 hours?"      Yes- gas and loss stool 6. CONSTIPATION: "Do you have constipation?" If so, "How bad is it?"     no 7. RECURRENT SYMPTOMS: "Have you had blood in your stools before?" If so, ask: "When was the last time?" and "What happened that time?"      Yes- hemorrhoid  8. BLOOD THINNERS: "Do you take any blood thinners?" (e.g., Coumadin/warfarin, Pradaxa/dabigatran, aspirin)     no 9. OTHER SYMPTOMS: "Do you have any other symptoms?"  (e.g., abdominal pain, vomiting, dizziness, fever)     Gas related abdominal pain, little tired- didn't sleep much. 10. PREGNANCY: "Is there any chance you are pregnant?" "When was your last menstrual period?"       n/a  Protocols used: RECTAL BLEEDING-A-AH

## 2019-12-12 NOTE — Progress Notes (Signed)
Subjective:    Patient ID: Meredith Dominguez, female    DOB: 09-14-1951, 69 y.o.   MRN: [id number]  HPI   Virtual Visit via Video Note  I connected with Meredith Dominguez on 12/12/19 at  9:40 AM EST by a video enabled telemedicine application and verified that I am speaking with the correct person using two identifiers.  Location: Patient: home Provider: office   I discussed the limitations of evaluation and management by telemedicine and the availability of in person appointments. The patient expressed understanding and agreed to proceed.  History of Present Illness:   Pt states she been having some abdomen/gas like pain on and off recently. She states felt a lot of gas at night but no severe pain. She got up to use bathroom. Had loose stools with clots mixed appearance. Some bright red appearance with other portions dark black.   Symptoms came up after eating very rich stew.  This morning at 7 am. She states had normal formed loose. No bright red blood or black stools. This morning she has no abdomen pain.   Pt does have have von willabrands.(she states she controls with supplaments.)   physical General-no acute distress, pleasant, oriented. Lungs- on inspection lungs appear unlabored. Neck- no tracheal deviation or jvd on inspection. Neuro- gross motor function appears intact. Skin- on face small scab left side. Abdomen- soft, nd, +bs,no rebound or guarding and no organomegaly. Faint rt lower quadrant tenderness. Later she notes some faint left lower quadrant.  Rectum- on insepction. No fissure seen, no hemorrhoid. Stool sample came back mildly positive. Stool light brown.    Assessment and Plan: With your recent transient abdomen discomfort, gas like pain, abdomen discomfort and von willebrands, we will get cbc and cmp state. In addition get von willebrands panel as it appears last saw hematologist in 2018.   Will have you pick up stool cards today and mail those back(or  drop those off later this week).  With recent symptoms if you have any further abdomen discomfort or loose stools/bloody stools then recommend going ahead and starting flagyl and cipro for potential early diverticulitis(on review did not see prior colonoscopy report). If you have any constant type pain then consider out patient Ct abdomen/pelvis.  Any severe type abdomen pain then ED evaluation.  Will follow labs and likely get you back in with Dr. Myna Hidalgo.  If abdomen discomfort, gas and blood in stools persists or confirmed by microscopic studies then refer to GI as well.   Follow up in 7 days or as needed  Follow Up Instructions:    I discussed the assessment and treatment plan with the patient. The patient was provided an opportunity to ask questions and all were answered. The patient agreed with the plan and demonstrated an understanding of the instructions.   The patient was advised to call back or seek an in-person evaluation if the symptoms worsen or if the condition fails to improve as anticipated.  I provided 40  minutes of  time during this encounter. Started of facetime encounter but video/audio failed twice after about 10 minutes. Then asked her to come in for labs and did physical exam in person and discussed recent events, plan going forward. Answered questions as well.   Esperanza Richters, PA-C    Review of Systems  Constitutional: Positive for fatigue. Negative for chills and fever.       Slight fatigue due to not sleeping all night after event.  Respiratory: Negative for chest tightness  and wheezing.   Cardiovascular: Negative for chest pain and palpitations.  Gastrointestinal: Positive for abdominal pain and blood in stool. Negative for rectal pain and vomiting.       Only when she passed stools.   Genitourinary: Negative for flank pain and frequency.  Musculoskeletal: Negative for back pain.  Skin: Negative for rash.  Neurological: Negative for dizziness, weakness  and numbness.  Hematological: Negative for adenopathy. Does not bruise/bleed easily.  Psychiatric/Behavioral: Negative for behavioral problems and decreased concentration.    Past Medical History:  Diagnosis Date  . Anemia   . Osteoporosis 05/15/2019  . Pharyngitis 05/25/2017  . Von Willebrand disease (HCC)      Social History   Socioeconomic History  . Marital status: Divorced    Spouse name: Not on file  . Number of children: Not on file  . Years of education: Not on file  . Highest education level: Not on file  Occupational History  . Not on file  Tobacco Use  . Smoking status: Never Smoker  . Smokeless tobacco: Never Used  Substance and Sexual Activity  . Alcohol use: Yes    Comment: wine once per week  . Drug use: No  . Sexual activity: Not on file  Other Topics Concern  . Not on file  Social History Narrative  . Not on file   Social Determinants of Health   Financial Resource Strain:   . Difficulty of Paying Living Expenses: Not on file  Food Insecurity:   . Worried About Programme researcher, broadcasting/film/video in the Last Year: Not on file  . Ran Out of Food in the Last Year: Not on file  Transportation Needs:   . Lack of Transportation (Medical): Not on file  . Lack of Transportation (Non-Medical): Not on file  Physical Activity:   . Days of Exercise per Week: Not on file  . Minutes of Exercise per Session: Not on file  Stress:   . Feeling of Stress : Not on file  Social Connections:   . Frequency of Communication with Friends and Family: Not on file  . Frequency of Social Gatherings with Friends and Family: Not on file  . Attends Religious Services: Not on file  . Active Member of Clubs or Organizations: Not on file  . Attends Banker Meetings: Not on file  . Marital Status: Not on file  Intimate Partner Violence:   . Fear of Current or Ex-Partner: Not on file  . Emotionally Abused: Not on file  . Physically Abused: Not on file  . Sexually Abused: Not on  file    Past Surgical History:  Procedure Laterality Date  . BREAST SURGERY     Breast Biopsy Left x2    Family History  Problem Relation Age of Onset  . Leukemia Cousin   . Cancer Mother   . Diabetes Father   . Heart disease Father   . Bleeding Disorder Neg Hx     Allergies  Allergen Reactions  . Gluten Meal Other (See Comments)    sensitivity  . Monosodium Glutamate Other (See Comments)    Hot and feels bad  . Other Other (See Comments)    Red places  . Sesame Oil Rash    Turns bright, ear and anus open and close.  . Tomato Other (See Comments)    Other night shade vegetables like eggplant, potato  . Wheat Bran Other (See Comments)    Stomach issues  . Sulfa Antibiotics Rash  Current Outpatient Medications on File Prior to Visit  Medication Sig Dispense Refill  . CHLOROPHYLL PO Take by mouth.    . Cholecalciferol (VITAMIN D3) 100000 UNIT/GM POWD 2 drops per day    . Cholecalciferol 1.25 MG (50000 UT) TABS 2 drops per day    . desmopressin (DDAVP NASAL) 0.01 % solution Place 2 sprays (20 mcg total) into the nose daily as needed. Nose bleeds 5 mL 12  . desmopressin (DDAVP) 0.01 % SOLN   10  . magnesium 30 MG tablet Take 30 mg by mouth 2 (two) times daily.    . Nutritional Supplements (NUTRITIONAL SUPPLEMENT PO) Take by mouth.    Marland Kitchen OVER THE COUNTER MEDICATION Take 1 tablet by mouth 2 (two) times daily.    Marland Kitchen PROBIOTIC PRODUCT PO Take by mouth.    . Sesame Oil OIL by Does not apply route.    Marland Kitchen UNABLE TO FIND Take by mouth.    Marland Kitchen UNABLE TO FIND 2 (two) times daily. Homeopathic Penicillin    . UNABLE TO FIND 3 (three) times a day as needed. BioActive Nutritional V-HP    . UNABLE TO FIND 3 (three) times a day. BioActive Homepathic Immuno Fortifier    . UNABLE TO FIND paraplus    . UNABLE TO FIND Take by mouth.    Marland Kitchen UNABLE TO FIND Take by mouth.    Marland Kitchen UNABLE TO FIND Okra pepcin    . UNABLE TO FIND Cal-ma    . UNABLE TO FIND daily. allerplex 7 - 2 caps daily    .  UNABLE TO FIND panethenics  Daily    . UNABLE TO FIND lym- immune, calming, sleep nes    . UNABLE TO FIND Adrenal forte    . UNABLE TO FIND anatone    . UNABLE TO FIND daily. Gabatone extra    . UNABLE TO FIND daily. Hydroside I94 folic acid    . UNABLE TO FIND Colloidal Silver    . UNABLE TO FIND Take by mouth.    Marland Kitchen UNABLE TO FIND Candida formula    . UNABLE TO FIND as needed. Zymex    . UNABLE TO FIND Take by mouth.    . vitamin B-12 (CYANOCOBALAMIN) 500 MCG tablet Take 500 mcg by mouth daily.     No current facility-administered medications on file prior to visit.    Temp 97.6 F (36.4 C) (Oral)   Ht 5' 6.5" (1.689 m)   Wt 153 lb (69.4 kg)   BMI 24.32 kg/m       Objective:   Physical Exam         Assessment & Plan:

## 2019-12-12 NOTE — Telephone Encounter (Signed)
See below. Pt put on with Sagiuer. FYI

## 2019-12-12 NOTE — Patient Instructions (Addendum)
With your recent transient abdomen discomfort, gas like pain, abdomen discomfort and von willebrands, we will get cbc and cmp state. In addition get von willebrands panel as it appears last saw hematologist in 2018.   Will have you pick up stool cards today and mail those back(or drop those off later this week).  With recent symptoms if you have any further abdomen discomfort or loose stools/bloody stools then recommend going ahead and starting flagyl and cipro for potential early diverticulitis(on review did not see prior colonoscopy report). If you have any constant type pain then consider out patient Ct abdomen/pelvis.  Any severe type abdomen pain then ED evaluation.  Will follow labs and likely get you back in with Dr. Myna Hidalgo.  If abdomen discomfort, gas and blood in stools persists or confirmed by microscopic studies then refer to GI as well.   Follow up in 7 days or as needed  Note only faint mild rt lower quadrant tenderness on exam. No heel jar type pain. Keep [place] update if rt lower quadrant worsens. Would get ct in that event.

## 2019-12-12 NOTE — Telephone Encounter (Signed)
Refer to hematolgist today.

## 2019-12-13 ENCOUNTER — Emergency Department (HOSPITAL_COMMUNITY)
Admission: EM | Admit: 2019-12-13 | Discharge: 2019-12-14 | Disposition: A | Discharge status: Home / Self Care | Payer: PPO | Attending: Emergency Medicine | Admitting: Emergency Medicine

## 2019-12-13 ENCOUNTER — Encounter: Payer: Self-pay | Admitting: Medical

## 2019-12-13 ENCOUNTER — Encounter (HOSPITAL_COMMUNITY): Payer: Self-pay | Admitting: Emergency Medicine

## 2019-12-13 ENCOUNTER — Other Ambulatory Visit: Payer: Self-pay

## 2019-12-13 DIAGNOSIS — L918 Other hypertrophic disorders of the skin: Secondary | ICD-10-CM | POA: Diagnosis present

## 2019-12-13 DIAGNOSIS — D6800 Von Willebrand disease, unspecified: Secondary | ICD-10-CM

## 2019-12-13 DIAGNOSIS — Z79899 Other long term (current) drug therapy: Secondary | ICD-10-CM | POA: Diagnosis not present

## 2019-12-13 NOTE — ED Triage Notes (Signed)
Patient pulled a skin tag at left upper lip this morning reports persistent bleeding , dressing applied at triage , pin point bleeding noted at arrival .

## 2019-12-14 MED ORDER — LIDOCAINE-EPINEPHRINE (PF) 2 %-1:200000 IJ SOLN
10.0000 mL | Freq: Once | INTRAMUSCULAR | Status: AC
  Administered 2019-12-14: 10 mL
  Filled 2019-12-14: qty 20

## 2019-12-14 MED ORDER — DESMOPRESSIN ACE SPRAY REFRIG 0.01 % NA SOLN
2.0000 | Freq: Once | NASAL | Status: AC
  Administered 2019-12-14: 2 via NASAL
  Filled 2019-12-14: qty 5

## 2019-12-14 NOTE — ED Provider Notes (Signed)
[facility] EMERGENCY DEPARTMENT Provider Note   CSN: [id number] Arrival date & time: 12/13/19  2311     History Chief Complaint  Patient presents with  . Skin tag - bleeding    Meredith Dominguez is a 69 y.o. female.  Patient presents to the emergency department for bleeding from a small wound on her face.  Patient has a history of von Willebrand disease.  She reports that several nights ago she accidentally scratched a small lesion on her face in her sleep and started bleeding.  The bleeding stopped but then today it started again.        Past Medical History:  Diagnosis Date  . Anemia   . Osteoporosis 05/15/2019  . Pharyngitis 05/25/2017  . Von Willebrand disease [facility])     Patient Active Problem List   Diagnosis Date Noted  . Osteoporosis 05/15/2019  . Pharyngitis 05/25/2017  . Von Willebrand disease, type IIa ([place]) 07/29/2016  . Acute blood loss anemia 07/29/2016  . Thrombocytopenia (Onset) 07/29/2016  . Bleeding gums     Past Surgical History:  Procedure Laterality Date  . BREAST SURGERY     Breast Biopsy Left x2     OB History   No obstetric history on file.     Family History  Problem Relation Age of Onset  . Leukemia Cousin   . Cancer Mother   . Diabetes Father   . Heart disease Father   . Bleeding Disorder Neg Hx     Social History   Tobacco Use  . Smoking status: Never Smoker  . Smokeless tobacco: Never Used  Substance Use Topics  . Alcohol use: Yes    Comment: wine once per week  . Drug use: No    Home Medications Prior to Admission medications   Medication Sig Start Date End Date Taking? Authorizing Provider  CHLOROPHYLL PO Take by mouth.    [provider]  Cholecalciferol (VITAMIN D3) 100000 UNIT/GM POWD 2 drops per day    [provider]  Cholecalciferol 1.25 MG (50000 UT) TABS 2 drops per day    [provider]  ciprofloxacin (CIPRO) 500 MG tablet Take 1 tablet (500 mg total) by mouth 2  (two) times daily. 12/12/19   Saguier, Percell Miller, PA-C  desmopressin (DDAVP NASAL) 0.01 % solution Place 2 sprays (20 mcg total) into the nose daily as needed. Nose bleeds 07/29/16   Rama, Venetia Maxon, MD  desmopressin (DDAVP) 0.01 % SOLN  02/23/17   [provider]  magnesium 30 MG tablet Take 30 mg by mouth 2 (two) times daily.    [provider]  metroNIDAZOLE (FLAGYL) 500 MG tablet Take 1 tablet (500 mg total) by mouth 3 (three) times daily. 12/12/19   Saguier, Percell Miller, PA-C  Nutritional Supplements (NUTRITIONAL SUPPLEMENT PO) Take by mouth.    [provider]  OVER THE COUNTER MEDICATION Take 1 tablet by mouth 2 (two) times daily.    [provider]  PROBIOTIC PRODUCT PO Take by mouth.    [provider]  Sesame Oil OIL by Does not apply route.    [provider]  UNABLE TO FIND Take by mouth.    [provider]  UNABLE TO FIND 2 (two) times daily. Homeopathic Penicillin    [provider]  UNABLE TO FIND 3 (three) times a day as needed. BioActive Nutritional V-HP    [provider]  UNABLE TO FIND 3 (three) times a day. BioActive Homepathic Immuno [employer]  [provider]  UNABLE TO FIND paraplus    [provider]  UNABLE TO FIND Take by mouth.    [provider]  UNABLE TO FIND Take by mouth.    [provider]  UNABLE TO FIND Okra pepcin    [provider]  UNABLE TO FIND Cal-ma    [provider]  UNABLE TO FIND daily. allerplex 7 - 2 caps daily    [provider]  UNABLE TO FIND panethenics  Daily    [provider]  UNABLE TO FIND lym- immune, calming, sleep nes    [provider]  UNABLE TO FIND Adrenal forte    [provider]  UNABLE TO FIND anatone    [provider]  UNABLE TO FIND daily. Gabatone extra    [provider]  UNABLE TO FIND daily. Hydroside B12 folic acid    [provider]   UNABLE TO FIND Colloidal Silver    [provider]  UNABLE TO FIND Take by mouth.    [provider]  UNABLE TO FIND Candida formula    [provider]  UNABLE TO FIND as needed. Zymex    [provider]  UNABLE TO FIND Take by mouth.    [provider]  vitamin B-12 (CYANOCOBALAMIN) 500 MCG tablet Take 500 mcg by mouth daily.    [provider]    Allergies    Gluten meal, Monosodium glutamate, Other, Sesame oil, Tomato, Wheat bran, and Sulfa antibiotics  Review of Systems   Review of Systems  Respiratory: Negative for shortness of breath.   Skin: Positive for wound.  All other systems reviewed and are negative.   Physical Exam Updated Vital Signs BP 108/82   Pulse 78   Temp 98.1 F (36.7 C) (Oral)   Resp 15   SpO2 98%   Physical Exam Vitals and nursing note reviewed.  Constitutional:      General: She is not in acute distress.    Appearance: Normal appearance. She is well-developed.  HENT:     Head: Normocephalic and atraumatic.     Right Ear: Hearing normal.     Left Ear: Hearing normal.     Nose: Nose normal.  Eyes:     Conjunctiva/sclera: Conjunctivae normal.     Pupils: Pupils are equal, round, and reactive to light.  Cardiovascular:     Rate and Rhythm: Regular rhythm.     Heart sounds: S1 normal and S2 normal. No murmur. No friction rub. No gallop.   Pulmonary:     Effort: Pulmonary effort is normal. No respiratory distress.     Breath sounds: Normal breath sounds.  Chest:     Chest wall: No tenderness.  Abdominal:     General: Bowel sounds are normal.     Palpations: Abdomen is soft.     Tenderness: There is no abdominal tenderness. There is no guarding or rebound. Negative signs include Murphy's sign and McBurney's sign.     Hernia: No hernia is present.  Musculoskeletal:        General: Normal range of motion.     Cervical back: Normal range of motion and neck supple.  Skin:    General: Skin is  warm and dry.     Findings: Lesion (Pinhole wound left upper lip with slight oozing of blood) present. No rash.  Neurological:     Mental Status: She is alert and oriented to person, place, and time.  GCS: GCS eye subscore is 4. GCS verbal subscore is 5. GCS motor subscore is 6.     Cranial Nerves: No cranial nerve deficit.     Sensory: No sensory deficit.     Coordination: Coordination normal.  Psychiatric:        Speech: Speech normal.        Behavior: Behavior normal.        Thought Content: Thought content normal.     ED Results / Procedures / Treatments   Labs (all labs ordered are listed, but only abnormal results are displayed) Labs Reviewed - No data to display  EKG None  Radiology DG Abd 1 View  Result Date: 12/12/2019 CLINICAL DATA:  Abdominal pain. EXAM: ABDOMEN - 1 VIEW COMPARISON:  None. FINDINGS: No dilated loops of large or small bowel. Gas and stool in the rectum. No pathologic calcifications. No organomegaly. No acute osseous abnormality IMPRESSION: No acute abdominal findings. Electronically Signed   By: Genevive Bi M.D.   On: 12/12/2019 12:51    Procedures .Marland KitchenLaceration Repair  Date/Time: 12/14/2019 3:32 AM Performed by: Gilda Crease, MD Authorized by: Gilda Crease, MD   Consent:    Consent obtained:  Verbal   Consent given by:  Patient Universal protocol:    Site/side marked: yes     Immediately prior to procedure, a time out was called: yes     Patient identity confirmed:  Verbally with patient Anesthesia (see MAR for exact dosages):    Anesthesia method:  Local infiltration   Local anesthetic:  Lidocaine 2% WITH epi Laceration details:    Location:  Lip   Lip location:  Upper exterior lip   Wound length (cm): pinhole. Repair type:    Repair type: no repair. Comments:     Area of bleeding was injected with lidocaine with epi and bleeding slowed.  Area was then covered with wound seal and pressure applied.  Bleeding  has now stopped.   (including critical care time)  Medications Ordered in ED Medications  lidocaine-EPINEPHrine (XYLOCAINE W/EPI) 2 %-1:200000 (PF) injection 10 mL (10 mLs Infiltration Given by Other 12/14/19 0216)  desmopressin (DDAVP) 0.01 % (10 mcg/activation) spray 2 spray (2 sprays Nasal Given 12/14/19 0250)    ED Course  I have reviewed the triage vital signs and the nursing notes.  Pertinent labs & imaging results that were available during my care of the patient were reviewed by me and considered in my medical decision making (see chart for details).    MDM Rules/Calculators/A&P                      Patient with history of von Willebrand's disease presents to emergency department for bleeding.  Patient has accidentally scratched a small lesion on her left upper lip and has had bleeding on and off for several days.  Examination reveals a pinhole area of slight oozing of blood currently.  The area was injected with epinephrine and then wound seal applied.  Bleeding has now stopped.  Patient given a dose of DDAVP as she has reported improvement with her bleeding in the past with administration.  This will hopefully help prevent repeat bleeding.  Final Clinical Impression(s) / ED Diagnoses Final diagnoses:  Von Willebrand disease (HCC)    Rx / DC Orders ED Discharge Orders    None       Dalaya Suppa, Canary Brim, MD 12/14/19 [phone]

## 2019-12-15 ENCOUNTER — Other Ambulatory Visit: Payer: Self-pay

## 2019-12-16 NOTE — Progress Notes (Addendum)
[facility] [address] [place], [place] [address] [phone]  Date:  12/18/2019   Name:  Meredith Dominguez   DOB:  [birth year]   MRN:  [id number]  PCP:  Pearline Cables, MD    Chief Complaint: ER follow up, Urinary Tract Infection (would like to be checked), and Hormone Check   History of Present Illness:  Meredith Dominguez is a 69 y.o. very pleasant female patient who presents with the following:  Here today for in person follow-up visit from recent ER visit Last seen by myself in June of 2020  Patient with history of von Willebrand's, thrombocytopenia, osteoporosis  Last seen by myself in June, she also saw my partner Ramon Dredge on January 19-that time she had transient abdominal pain.  Ramon Dredge got some lab work which shows stable thrombocytopenia at 77,000 She did present to the ER on January 20 with concern of bleeding from a scratch on her face-this wound was treated, and bleeding resolved She accidentally scratched her own face  The ER doc was able to get her bleeding under control easily, has not bled since   Flu vaccine- declines  Colon cancer screening- not done yet  Mammogram- has generally not wanted, she prefers "thermography"   I have advised her that this is not an adequate breast cancer screening method   Her hematologist is Dr Earlie Raveling - she has an appt coming up   She notes that she lives alone, her friend is not able to visit due to friend's husband being under quarantine for open heart surgery  She notes that she may have some vaginal dryness at times, not overly bothersome We have not done a pap for her since we started our care relationship after age 59.  She is not able to remember the exact date of her last Pap, does not have any history of abnl pap per her recollection  She notes that she sometimes gets "roundworms, tapeworms, giardia or an amoeba" for which she is taking some sort of  natural treatment.   She would like to have a urine test for UTI today- no particular sx    She also did have episodes of rectal bleeding just about one week ago- this seems to have resolved now.  She describes a feeling of pressure and gas, then she had a bowel movement and noted blood in the bowl.  She is not seeing any blood in her stool now.   She notes that she has had "fissures or a hemorrhoid since I was a little girl" Also recent positive hemoccult test- overdue for colon cancer screening  We are not sure if she has a colonoscopy candidate due to her bleeding condition, but I will refer her to GI for consultation Patient Active Problem List   Diagnosis Date Noted  . Osteoporosis 05/15/2019  . Pharyngitis 05/25/2017  . Von Willebrand disease, type IIa (HCC) 07/29/2016  . Acute blood loss anemia 07/29/2016  . Thrombocytopenia (HCC) 07/29/2016  . Bleeding gums     Past Medical History:  Diagnosis Date  . Anemia   . Osteoporosis 05/15/2019  . Pharyngitis 05/25/2017  . Von Willebrand disease (HCC)     Past Surgical History:  Procedure Laterality Date  . BREAST SURGERY     Breast Biopsy Left x2    Social History   Tobacco Use  . Smoking status: Never Smoker  . Smokeless tobacco: Never Used  Substance Use  Topics  . Alcohol use: Yes    Comment: wine once per week  . Drug use: No    Family History  Problem Relation Age of Onset  . Leukemia Cousin   . Cancer Mother   . Diabetes Father   . Heart disease Father   . Bleeding Disorder Neg Hx     Allergies  Allergen Reactions  . Gluten Meal Other (See Comments)    sensitivity  . Monosodium Glutamate Other (See Comments)    Hot and feels bad  . Other Other (See Comments)    Red places  . Sesame Oil Rash    Turns bright, ear and anus open and close.  . Tomato Other (See Comments)    Other night shade vegetables like eggplant, potato  . Wheat Bran Other (See Comments)    Stomach issues  . Sulfa Antibiotics Rash     Medication list has been reviewed and updated.  Current Outpatient Medications on File Prior to Visit  Medication Sig Dispense Refill  . CHLOROPHYLL PO Take by mouth.    . Cholecalciferol (VITAMIN D3) 100000 UNIT/GM POWD 2 drops per day    . Cholecalciferol 1.25 MG (50000 UT) TABS 2 drops per day    . desmopressin (DDAVP NASAL) 0.01 % solution Place 2 sprays (20 mcg total) into the nose daily as needed. Nose bleeds 5 mL 12  . desmopressin (DDAVP) 0.01 % SOLN   10  . magnesium 30 MG tablet Take 30 mg by mouth 2 (two) times daily.    . Nutritional Supplements (NUTRITIONAL SUPPLEMENT PO) Take by mouth.    Marland Kitchen OVER THE COUNTER MEDICATION Take 1 tablet by mouth 2 (two) times daily.    . Sesame Oil OIL by Does not apply route.    Marland Kitchen UNABLE TO FIND Take by mouth.    Marland Kitchen UNABLE TO FIND 3 (three) times a day as needed. BioActive Nutritional V-HP    . UNABLE TO FIND 3 (three) times a day. BioActive Homepathic Immuno Fortifier    . UNABLE TO FIND paraplus    . UNABLE TO FIND Take by mouth.    Marland Kitchen UNABLE TO FIND Take by mouth.    Marland Kitchen UNABLE TO FIND Okra pepcin    . UNABLE TO FIND Cal-ma    . UNABLE TO FIND daily. allerplex 7 - 2 caps daily    . UNABLE TO FIND Adrenal forte    . UNABLE TO FIND daily. Gabatone extra    . UNABLE TO FIND daily. Hydroside B12 folic acid    . UNABLE TO FIND Colloidal Silver    . UNABLE TO FIND Take by mouth.    Marland Kitchen UNABLE TO FIND Candida formula    . UNABLE TO FIND Take by mouth.    . vitamin B-12 (CYANOCOBALAMIN) 500 MCG tablet Take 500 mcg by mouth daily.     No current facility-administered medications on file prior to visit.    Review of Systems:  As per HPI- otherwise negative.   Physical Examination: Vitals:   12/18/19 1543 12/18/19 1645  BP: 116/78   Pulse: (!) 103 96  Resp: 16   Temp: 98.3 F (36.8 C)   SpO2: 98%    Vitals:   12/18/19 1543  Weight: 148 lb (67.1 kg)  Height: 5' 6.5" (1.689 m)   Body mass index is 23.53 kg/m. Ideal Body Weight:  Weight in (lb) to have BMI = 25: 156.9  GEN: WDWN, NAD, Non-toxic, A & O x 3,  normal weight, looks well Small likely silver nitrate spot on her left cheek, no active bleeding  HEENT: Atraumatic, Normocephalic. Neck supple. No masses, No LAD. Ears and Nose: No external deformity. CV: RRR, No M/G/R. No JVD. No thrill. No extra heart sounds. PULM: CTA B, no wheezes, crackles, rhonchi. No retractions. No resp. distress. No accessory muscle use. ABD: S, NT, ND, +BS. No rebound. No HSM. EXTR: No c/c/e NEURO Normal gait.  PSYCH: Normally interactive. Conversant. Not depressed or anxious appearing.  Calm demeanor. Pt has a somewhat unusual affect and health belief system which is stable for her   BP Readings from Last 3 Encounters:  12/18/19 116/78  12/14/19 127/82  12/12/19 (!) 148/90   Pulse Readings from Last 3 Encounters:  12/18/19 96  12/14/19 (!) 105  12/12/19 (!) 109   Results for orders placed or performed in visit on 12/18/19  Urine Culture   Specimen: Urine  Result Value Ref Range   MICRO NUMBER: [id number]    SPECIMEN QUALITY: Adequate    Sample Source URINE    STATUS: FINAL    Result: No Growth   CBC  Result Value Ref Range   WBC 10.8 (H) 4.0 - 10.5 K/uL   RBC 4.80 3.87 - 5.11 Mil/uL   Platelets 65.0 R (L) 150.0 - 400.0 K/uL   Hemoglobin 13.9 12.0 - 15.0 g/dL   HCT 41.1 36.0 - 46.0 %   MCV 85.5 78.0 - 100.0 fl   MCHC 33.9 30.0 - 36.0 g/dL   RDW 14.0 11.5 - 15.5 %  Urine Microscopic Only  Result Value Ref Range   WBC, UA 0-2/hpf 0-2/hpf   RBC / HPF none seen 0-2/hpf   Mucus, UA Presence of (A) None   Squamous Epithelial / LPF Rare(0-4/hpf) Rare(0-4/hpf)   Hyaline Casts, UA Presence of (A) None   Ca Oxalate Crys, UA Presence of (A) None  POCT urinalysis dipstick  Result Value Ref Range   Color, UA yellow yellow   Clarity, UA cloudy (A) clear   Glucose, UA negative negative mg/dL   Bilirubin, UA negative negative   Ketones, POC UA trace (5) (A) negative mg/dL    Spec Grav, UA 1.025 1.010 - 1.025   Blood, UA trace-intact (A) negative   pH, UA 5.5 5.0 - 8.0   Protein Ur, POC trace (A) negative mg/dL   Urobilinogen, UA 0.2 0.2 or 1.0 E.U./dL   Nitrite, UA Negative Negative   Leukocytes, UA Negative Negative     Assessment and Plan: Vaginal dryness - Plan: Urine Culture, POCT urinalysis dipstick  Rectal bleeding - Plan: Ambulatory referral to Gastroenterology, CBC  Microhematuria - Plan: Urine Microscopic Only  Here today with a couple of concerns She recently had a bleeding spot on her cheek, went to ER for treatment Will check CBC today to look for any drop in her Hg/hct.   Upcoming appt with hematology Microhematuria today- check urine micro  Rectal bleeding- check CBC as above, pt is no longer seeing [facility].  Will refer to GI  Moderate med decision making todays She will seek care if any major changes or concerns   This visit occurred during the SARS-CoV-2 public health emergency.  Safety protocols were in place, including screening questions prior to the visit, additional usage of staff PPE, and extensive cleaning of exam room while observing appropriate contact time as indicated for disinfecting solutions.    Signed Lamar Blinks, MD  Received labs so far 1/26 Results for orders placed or  performed in visit on 12/18/19  Urine Culture   Specimen: Urine  Result Value Ref Range   MICRO NUMBER: [id number]    SPECIMEN QUALITY: Adequate    Sample Source URINE    STATUS: FINAL    Result: No Growth   CBC  Result Value Ref Range   WBC 10.8 (H) 4.0 - 10.5 K/uL   RBC 4.80 3.87 - 5.11 Mil/uL   Platelets 65.0 R (L) 150.0 - 400.0 K/uL   Hemoglobin 13.9 12.0 - 15.0 g/dL   HCT 16.0 10.9 - 32.3 %   MCV 85.5 78.0 - 100.0 fl   MCHC 33.9 30.0 - 36.0 g/dL   RDW 55.7 32.2 - 02.5 %  Urine Microscopic Only  Result Value Ref Range   WBC, UA 0-2/hpf 0-2/hpf   RBC / HPF none seen 0-2/hpf   Mucus, UA Presence of (A) None   Squamous Epithelial /  LPF Rare(0-4/hpf) Rare(0-4/hpf)   Hyaline Casts, UA Presence of (A) None   Ca Oxalate Crys, UA Presence of (A) None  POCT urinalysis dipstick  Result Value Ref Range   Color, UA yellow yellow   Clarity, UA cloudy (A) clear   Glucose, UA negative negative mg/dL   Bilirubin, UA negative negative   Ketones, POC UA trace (5) (A) negative mg/dL   Spec Grav, UA 4.270 6.237 - 1.025   Blood, UA trace-intact (A) negative   pH, UA 5.5 5.0 - 8.0   Protein Ur, POC trace (A) negative mg/dL   Urobilinogen, UA 0.2 0.2 or 1.0 E.U./dL   Nitrite, UA Negative Negative   Leukocytes, UA Negative Negative   Await urine culture, so far ok   Received urine culture 1/27, message to patient

## 2019-12-18 ENCOUNTER — Ambulatory Visit (INDEPENDENT_AMBULATORY_CARE_PROVIDER_SITE_OTHER): Payer: PPO | Admitting: Family Medicine

## 2019-12-18 ENCOUNTER — Encounter: Payer: Self-pay | Admitting: Family Medicine

## 2019-12-18 ENCOUNTER — Other Ambulatory Visit: Payer: Self-pay

## 2019-12-18 ENCOUNTER — Other Ambulatory Visit (INDEPENDENT_AMBULATORY_CARE_PROVIDER_SITE_OTHER): Payer: PPO

## 2019-12-18 VITALS — BP 116/78 | HR 96 | Temp 98.3°F | Resp 16 | Ht 66.5 in | Wt 148.0 lb

## 2019-12-18 DIAGNOSIS — R3129 Other microscopic hematuria: Secondary | ICD-10-CM | POA: Diagnosis not present

## 2019-12-18 DIAGNOSIS — R109 Unspecified abdominal pain: Secondary | ICD-10-CM

## 2019-12-18 DIAGNOSIS — K921 Melena: Secondary | ICD-10-CM | POA: Diagnosis not present

## 2019-12-18 DIAGNOSIS — N898 Other specified noninflammatory disorders of vagina: Secondary | ICD-10-CM | POA: Diagnosis not present

## 2019-12-18 DIAGNOSIS — K625 Hemorrhage of anus and rectum: Secondary | ICD-10-CM | POA: Diagnosis not present

## 2019-12-18 LAB — POCT URINALYSIS DIP (MANUAL ENTRY)
Bilirubin, UA: NEGATIVE
Glucose, UA: NEGATIVE mg/dL
Leukocytes, UA: NEGATIVE
Nitrite, UA: NEGATIVE
Spec Grav, UA: 1.025 (ref 1.010–1.025)
Urobilinogen, UA: 0.2 E.U./dL
pH, UA: 5.5 (ref 5.0–8.0)

## 2019-12-18 LAB — VON WILLEBRAND PANEL
Factor-VIII Activity: 85 % normal (ref 50–180)
Ristocetin Co-Factor: 25 % normal — ABNORMAL LOW (ref 42–200)
Von Willebrand Antigen, Plasma: 103 % (ref 50–217)
aPTT: 29 s (ref 23–32)

## 2019-12-18 LAB — FECAL OCCULT BLOOD, IMMUNOCHEMICAL: Fecal Occult Bld: POSITIVE — AB

## 2019-12-18 NOTE — Patient Instructions (Addendum)
It was good to see you again today- I will be in touch with your urine results asap With recent rectal bleeding, I would suggest that we set you up to see GI for a colon cancer screening of some type-  I will make this referral for you We will also be in touch with your repeat blood levels asap

## 2019-12-19 ENCOUNTER — Encounter: Payer: Self-pay | Admitting: Family Medicine

## 2019-12-19 LAB — CBC
HCT: 41.1 % (ref 36.0–46.0)
Hemoglobin: 13.9 g/dL (ref 12.0–15.0)
MCHC: 33.9 g/dL (ref 30.0–36.0)
MCV: 85.5 fl (ref 78.0–100.0)
Platelets: 65 10*3/uL — ABNORMAL LOW (ref 150.0–400.0)
RBC: 4.8 Mil/uL (ref 3.87–5.11)
RDW: 14 % (ref 11.5–15.5)
WBC: 10.8 10*3/uL — ABNORMAL HIGH (ref 4.0–10.5)

## 2019-12-19 LAB — URINALYSIS, MICROSCOPIC ONLY: RBC / HPF: NONE SEEN (ref 0–?)

## 2019-12-19 LAB — URINE CULTURE
MICRO NUMBER:: 10076835
Result:: NO GROWTH
SPECIMEN QUALITY:: ADEQUATE

## 2019-12-20 ENCOUNTER — Telehealth: Payer: Self-pay | Admitting: Family Medicine

## 2019-12-20 ENCOUNTER — Telehealth: Payer: Self-pay

## 2019-12-20 ENCOUNTER — Encounter: Payer: Self-pay | Admitting: Family Medicine

## 2019-12-20 ENCOUNTER — Other Ambulatory Visit: Payer: Self-pay | Admitting: Family

## 2019-12-20 DIAGNOSIS — D680 Von Willebrand's disease: Secondary | ICD-10-CM

## 2019-12-20 DIAGNOSIS — R143 Flatulence: Secondary | ICD-10-CM

## 2019-12-20 DIAGNOSIS — D68020 Von Willebrand disease, type 2a: Secondary | ICD-10-CM

## 2019-12-20 DIAGNOSIS — D696 Thrombocytopenia, unspecified: Secondary | ICD-10-CM

## 2019-12-20 NOTE — Telephone Encounter (Signed)
Discussed matter with patient, advised her to see PA on 12/27/2019 as scheduled.

## 2019-12-20 NOTE — Telephone Encounter (Signed)
Pt called wanting to know, if she does in fact have an infection, should she be waiting until 12/27/19 to see a GI MD?  She can get in earlier with a PA, but she is concerned about seeing a PA.  Please advise.

## 2019-12-20 NOTE — Telephone Encounter (Signed)
I have pended referral.  

## 2019-12-20 NOTE — Telephone Encounter (Signed)
Pt called concerned about infection in her colon and is having fatigue and is still gassy/feeling left sided pressure. Has discovered that [facility] [employer] is not accepted at [facility]. Is asking for an urgent referral to [facility] or another [facility] facility.

## 2019-12-21 ENCOUNTER — Inpatient Hospital Stay: Payer: PPO

## 2019-12-21 ENCOUNTER — Telehealth: Payer: Self-pay | Admitting: Family

## 2019-12-21 ENCOUNTER — Encounter: Payer: Self-pay | Admitting: Family

## 2019-12-21 ENCOUNTER — Other Ambulatory Visit: Payer: Self-pay

## 2019-12-21 ENCOUNTER — Inpatient Hospital Stay: Payer: PPO | Attending: Family | Admitting: Family

## 2019-12-21 VITALS — BP 106/82 | HR 89 | Temp 97.7°F | Resp 18 | Ht 66.5 in | Wt 146.4 lb

## 2019-12-21 DIAGNOSIS — D680 Von Willebrand's disease: Secondary | ICD-10-CM | POA: Diagnosis not present

## 2019-12-21 DIAGNOSIS — D68020 Von Willebrand disease, type 2a: Secondary | ICD-10-CM

## 2019-12-21 DIAGNOSIS — Z882 Allergy status to sulfonamides status: Secondary | ICD-10-CM | POA: Insufficient documentation

## 2019-12-21 DIAGNOSIS — D696 Thrombocytopenia, unspecified: Secondary | ICD-10-CM | POA: Insufficient documentation

## 2019-12-21 DIAGNOSIS — Z79899 Other long term (current) drug therapy: Secondary | ICD-10-CM | POA: Diagnosis not present

## 2019-12-21 DIAGNOSIS — K922 Gastrointestinal hemorrhage, unspecified: Secondary | ICD-10-CM | POA: Diagnosis not present

## 2019-12-21 LAB — CBC WITH DIFFERENTIAL (CANCER CENTER ONLY)
Abs Immature Granulocytes: 0.02 10*3/uL (ref 0.00–0.07)
Basophils Absolute: 0.1 10*3/uL (ref 0.0–0.1)
Basophils Relative: 1 %
Eosinophils Absolute: 0.1 10*3/uL (ref 0.0–0.5)
Eosinophils Relative: 2 %
HCT: 41.1 % (ref 36.0–46.0)
Hemoglobin: 13.4 g/dL (ref 12.0–15.0)
Immature Granulocytes: 0 %
Lymphocytes Relative: 24 %
Lymphs Abs: 2.1 10*3/uL (ref 0.7–4.0)
MCH: 28 pg (ref 26.0–34.0)
MCHC: 32.6 g/dL (ref 30.0–36.0)
MCV: 86 fL (ref 80.0–100.0)
Monocytes Absolute: 0.8 10*3/uL (ref 0.1–1.0)
Monocytes Relative: 10 %
Neutro Abs: 5.5 10*3/uL (ref 1.7–7.7)
Neutrophils Relative %: 63 %
Platelet Count: 87 10*3/uL — ABNORMAL LOW (ref 150–400)
RBC: 4.78 MIL/uL (ref 3.87–5.11)
RDW: 13.3 % (ref 11.5–15.5)
WBC Count: 8.7 10*3/uL (ref 4.0–10.5)
nRBC: 0 % (ref 0.0–0.2)

## 2019-12-21 LAB — PLATELET BY CITRATE

## 2019-12-21 LAB — SAVE SMEAR(SSMR), FOR PROVIDER SLIDE REVIEW

## 2019-12-21 LAB — APTT: aPTT: 36 seconds (ref 24–36)

## 2019-12-21 NOTE — Progress Notes (Signed)
Hematology and Oncology Follow Up Visit  Meredith Dominguez [id number] [birth year] 69 y.o. 12/21/2019   Principle Diagnosis:  Type IIA Von Willebrand disease Thrombocytopenia  Current Therapy:   Observation   Interim History:  Meredith Dominguez is here today for follow-up. She had a spot on the left upper lip that she accidentally picked while she was sleeping that bled off and on for a couple days. She went to the ED on 1/20 and they were able to treat with epinephrine injection and sealed the wound. She also received DDAVP while there.  Her ristocetin cofactor was low at 25. Today's lab work is pending.  She has also had bright red blood at times in her stool. This is intermittent and brief (stopping without intervention). She has an appointment with GI next week for further work up.  No bruising or petechiae. Platelet count is stable at 87, Hgb 13.4 and WBC count 8.7.  No fever, chills, n/v, cough, rash, dizziness, SOB, chest pain or changes in bladder habits.  No swelling, tenderness, numbness or tingling in her extremities at this time.  No falls or syncope.  She has maintained a good appetite and is currently on a bland diet. She is hydrating well and her weight is stable at 146 lbs.   ECOG Performance Status: 0 - Asymptomatic  Medications:  Allergies as of 12/21/2019      Reactions   Gluten Meal Other (See Comments)   sensitivity   Monosodium Glutamate Other (See Comments)   Hot and feels bad   Other Other (See Comments)   Red places   Sesame Oil Rash   Turns bright, ear and anus open and close.   Tomato Other (See Comments)   Other night shade vegetables like eggplant, potato   Wheat Bran Other (See Comments)   Stomach issues   Sulfa Antibiotics Rash      Medication List       Accurate as of December 21, 2019 11:58 AM. If you have any questions, ask your nurse or doctor.        CHLOROPHYLL PO Take by mouth.   Cholecalciferol 1.25 MG (50000 UT) Tabs 2 drops per day     desmopressin 0.01 % Soln Commonly known as: DDAVP   desmopressin 0.01 % solution Commonly known as: DDAVP NASAL Place 2 sprays (20 mcg total) into the nose daily as needed. Nose bleeds   magnesium 30 MG tablet Take 30 mg by mouth 2 (two) times daily.   NUTRITIONAL SUPPLEMENT PO Take by mouth.   OVER THE COUNTER MEDICATION Take 1 tablet by mouth 2 (two) times daily.   Sesame Oil Oil by Does not apply route.   UNABLE TO FIND Take by mouth.   UNABLE TO FIND 3 (three) times a day as needed. BioActive Nutritional V-HP   UNABLE TO FIND 3 (three) times a day. BioActive Homepathic Immuno Fortifier   UNABLE TO FIND paraplus   UNABLE TO FIND Take by mouth.   UNABLE TO FIND Take by mouth.   UNABLE TO FIND Okra pepcin   UNABLE TO FIND Cal-ma   UNABLE TO FIND daily. allerplex 7 - 2 caps daily   UNABLE TO FIND Adrenal forte   UNABLE TO FIND daily. Gabatone extra   UNABLE TO FIND daily. Hydroside B12 folic acid   UNABLE TO FIND Colloidal Silver   UNABLE TO FIND Take by mouth.   UNABLE TO FIND Candida formula   UNABLE TO FIND Take by mouth.   vitamin  B-12 500 MCG tablet Commonly known as: CYANOCOBALAMIN Take 500 mcg by mouth daily.   Vitamin D3 100000 UNIT/GM Powd 2 drops per day       Allergies:  Allergies  Allergen Reactions  . Gluten Meal Other (See Comments)    sensitivity  . Monosodium Glutamate Other (See Comments)    Hot and feels bad  . Other Other (See Comments)    Red places  . Sesame Oil Rash    Turns bright, ear and anus open and close.  . Tomato Other (See Comments)    Other night shade vegetables like eggplant, potato  . Wheat Bran Other (See Comments)    Stomach issues  . Sulfa Antibiotics Rash    Past Medical History, Surgical history, Social history, and Family History were reviewed and updated.  Review of Systems: All other 10 point review of systems is negative.   Physical Exam:  vitals were not taken for this  visit.   Wt Readings from Last 3 Encounters:  12/18/19 148 lb (67.1 kg)  12/12/19 153 lb (69.4 kg)  05/08/19 150 lb (68 kg)    Ocular: Sclerae unicteric, pupils equal, round and reactive to light Ear-nose-throat: Oropharynx clear, dentition fair Lymphatic: No cervical or supraclavicular adenopathy Lungs no rales or rhonchi, good excursion bilaterally Heart regular rate and rhythm, no murmur appreciated Abd soft, nontender, positive bowel sounds, no liver or spleen tip palpated on exam, no fluid wave  MSK no focal spinal tenderness, no joint edema Neuro: non-focal, well-oriented, appropriate affect Breasts: Deferred   Lab Results  Component Value Date   WBC 8.7 12/21/2019   HGB 13.4 12/21/2019   HCT 41.1 12/21/2019   MCV 86.0 12/21/2019   PLT 87 (L) 12/21/2019   No results found for: FERRITIN, IRON, TIBC, UIBC, IRONPCTSAT Lab Results  Component Value Date   RBC 4.78 12/21/2019   No results found for: KPAFRELGTCHN, LAMBDASER, KAPLAMBRATIO No results found for: IGGSERUM, IGA, IGMSERUM No results found for: Ronnald Ramp, A1GS, A2GS, Violet Baldy, MSPIKE, SPEI   Chemistry      Component Value Date/Time   NA 138 12/12/2019 1126   K 4.2 12/12/2019 1126   CL 105 12/12/2019 1126   CO2 26 12/12/2019 1126   BUN 18 12/12/2019 1126   CREATININE 0.86 12/12/2019 1126      Component Value Date/Time   CALCIUM 9.2 12/12/2019 1126   ALKPHOS 70 12/12/2019 1126   AST 20 12/12/2019 1126   ALT 13 12/12/2019 1126   BILITOT 0.5 12/12/2019 1126       Impression and Plan: Meredith Dominguez is a a very pleasant 69 yo caucasian female with history of type IIA Von Willebrand disease as well as thrombocytopenia.  Her panel of labs is pending. We will see if any of her counts are still low.  No intervention needed at this time. She will be following up with GI next week to determine the cause of intermittent GI blood loss.  We will continue to follow-up as needed.  She will  contact our office with any questions or concerns.   Laverna Peace, NP 1/28/202111:58 AM

## 2019-12-21 NOTE — Telephone Encounter (Signed)
Follows up as needed. Per 1/28 los

## 2019-12-25 LAB — COAG STUDIES INTERP REPORT

## 2019-12-25 LAB — VON WILLEBRAND PANEL
Coagulation Factor VIII: 96 % (ref 56–140)
Ristocetin Co-factor, Plasma: 12 % — ABNORMAL LOW (ref 50–200)
Von Willebrand Antigen, Plasma: 99 % (ref 50–200)

## 2019-12-27 DIAGNOSIS — R140 Abdominal distension (gaseous): Secondary | ICD-10-CM | POA: Diagnosis not present

## 2019-12-27 DIAGNOSIS — R194 Change in bowel habit: Secondary | ICD-10-CM | POA: Diagnosis not present

## 2019-12-27 DIAGNOSIS — K921 Melena: Secondary | ICD-10-CM | POA: Diagnosis not present

## 2019-12-27 DIAGNOSIS — R1084 Generalized abdominal pain: Secondary | ICD-10-CM | POA: Diagnosis not present

## 2019-12-28 ENCOUNTER — Telehealth: Payer: Self-pay | Admitting: *Deleted

## 2019-12-28 DIAGNOSIS — R140 Abdominal distension (gaseous): Secondary | ICD-10-CM | POA: Diagnosis not present

## 2019-12-28 DIAGNOSIS — K921 Melena: Secondary | ICD-10-CM | POA: Diagnosis not present

## 2019-12-28 DIAGNOSIS — R194 Change in bowel habit: Secondary | ICD-10-CM | POA: Diagnosis not present

## 2019-12-28 DIAGNOSIS — R1084 Generalized abdominal pain: Secondary | ICD-10-CM | POA: Diagnosis not present

## 2019-12-28 NOTE — Telephone Encounter (Signed)
Call received from patient wanting to know what infusion she would need if she decides to have a colonoscopy as recommended by Dr. Octaviano Glow.  Pt instructed that she would need a DDAVP infusion which would need to be coordinated with Dr. Christella Scheuermann office prior to her colonoscopy per order of S. [place] NP.  Pt appreciative of assistance and has no further questions at this time.

## 2020-01-01 ENCOUNTER — Telehealth: Payer: Self-pay | Admitting: Family Medicine

## 2020-01-01 ENCOUNTER — Telehealth: Payer: Self-pay | Admitting: *Deleted

## 2020-01-01 DIAGNOSIS — N951 Menopausal and female climacteric states: Secondary | ICD-10-CM

## 2020-01-01 NOTE — Telephone Encounter (Signed)
Referral placed.

## 2020-01-01 NOTE — Telephone Encounter (Signed)
PAtient called stating that she will be scheduling a colonoscopy in the future and will need Desmopressin.  She said the GI office will call us to coordinate.  Told patient to keep [place] updated as to the date of her procedure.

## 2020-01-01 NOTE — Telephone Encounter (Signed)
Patient states that she would like a referral for a OBGYN.

## 2020-01-05 DIAGNOSIS — R1084 Generalized abdominal pain: Secondary | ICD-10-CM | POA: Diagnosis not present

## 2020-01-05 DIAGNOSIS — Z01812 Encounter for preprocedural laboratory examination: Secondary | ICD-10-CM | POA: Diagnosis not present

## 2020-01-05 DIAGNOSIS — Z20822 Contact with and (suspected) exposure to covid-19: Secondary | ICD-10-CM | POA: Diagnosis not present

## 2020-01-05 DIAGNOSIS — R140 Abdominal distension (gaseous): Secondary | ICD-10-CM | POA: Diagnosis not present

## 2020-01-05 DIAGNOSIS — R194 Change in bowel habit: Secondary | ICD-10-CM | POA: Diagnosis not present

## 2020-01-05 DIAGNOSIS — K921 Melena: Secondary | ICD-10-CM | POA: Diagnosis not present

## 2020-01-10 DIAGNOSIS — R194 Change in bowel habit: Secondary | ICD-10-CM | POA: Diagnosis not present

## 2020-01-10 DIAGNOSIS — R1030 Lower abdominal pain, unspecified: Secondary | ICD-10-CM | POA: Diagnosis not present

## 2020-01-10 DIAGNOSIS — R140 Abdominal distension (gaseous): Secondary | ICD-10-CM | POA: Diagnosis not present

## 2020-01-10 DIAGNOSIS — R634 Abnormal weight loss: Secondary | ICD-10-CM | POA: Diagnosis not present

## 2020-01-10 DIAGNOSIS — K921 Melena: Secondary | ICD-10-CM | POA: Diagnosis not present

## 2020-01-10 DIAGNOSIS — K5900 Constipation, unspecified: Secondary | ICD-10-CM | POA: Diagnosis not present

## 2020-01-10 DIAGNOSIS — R1084 Generalized abdominal pain: Secondary | ICD-10-CM | POA: Diagnosis not present

## 2020-01-12 ENCOUNTER — Telehealth: Payer: Self-pay | Admitting: *Deleted

## 2020-01-12 NOTE — Telephone Encounter (Signed)
Dr. Dorna Leitz from [facility] GI department would like to speak with Dr. Myna Hidalgo about mutual patient. His cell number is [phone]. I told Dr. Octaviano Glow that Dr. Myna Hidalgo was out of the office today and would return on Monday. He verbalized understanding.

## 2020-01-15 ENCOUNTER — Other Ambulatory Visit: Payer: Self-pay

## 2020-01-15 ENCOUNTER — Ambulatory Visit (INDEPENDENT_AMBULATORY_CARE_PROVIDER_SITE_OTHER): Payer: PPO | Admitting: Family Medicine

## 2020-01-15 ENCOUNTER — Telehealth: Payer: Self-pay | Admitting: Family Medicine

## 2020-01-15 ENCOUNTER — Encounter: Payer: Self-pay | Admitting: Family Medicine

## 2020-01-15 VITALS — BP 118/76 | HR 106 | Temp 95.6°F | Resp 16 | Ht 66.5 in | Wt 147.0 lb

## 2020-01-15 DIAGNOSIS — R079 Chest pain, unspecified: Secondary | ICD-10-CM

## 2020-01-15 NOTE — Telephone Encounter (Signed)
Patient states that she called [facility] this morning due to a feeling she is having from time to time in her Chest and all over her body  . Patient was advised to call out office to schedule an EKG. Please advise .  I have reached out to traige nurse, no response.

## 2020-01-15 NOTE — Progress Notes (Signed)
[facility] [address] [place], [place] [address] [phone]  Date:  01/15/2020   Name:  Meredith Dominguez   DOB:  09-23-1951   MRN:  [id number]  PCP:  Pearline Cables, MD    Chief Complaint: Chest Pain (chest pain, radiates to breast)   History of Present Illness:  Meredith Dominguez is a 69 y.o. very pleasant female patient who presents with the following:  Pt with history of Von Willebrand disease and thrombocytopenia, osteoporosis Here today with concern of wanting an EKG Last seen by myself in January with vaginal dryness   She had called her GI doctor and complained of "little blips" that might cause pain in her chest or over her abdomen or trunk/ back.  These might last for 30 seconds and then go away She has noted this off and on for a couple of months She was told to see her primary care doctor for EKG  "I do know I have a tapeworm"- she states she was dx with a tapeworm per an "alternative doctor" and is also seeing a mainstream gastroenterologist for other issues  She is otherwise not having any CP or pressure, no SOB She tries to get 2,000 steps per day at least She does not have any exertional CP-she does not do a lot of formal exercise, but notes that she can dance for half an hour with no chest pain  She does not have any history disease that she is aware of Her father did have DM but she is not sure about any family history of heart disease   Patient Active Problem List   Diagnosis Date Noted  . Osteoporosis 05/15/2019  . Pharyngitis 05/25/2017  . Von Willebrand disease, type IIa (HCC) 07/29/2016  . Acute blood loss anemia 07/29/2016  . Thrombocytopenia (HCC) 07/29/2016  . Bleeding gums     Past Medical History:  Diagnosis Date  . Anemia   . Osteoporosis 05/15/2019  . Pharyngitis 05/25/2017  . Von Willebrand disease (HCC)     Past Surgical History:  Procedure Laterality Date  . BREAST  SURGERY     Breast Biopsy Left x2    Social History   Tobacco Use  . Smoking status: Never Smoker  . Smokeless tobacco: Never Used  Substance Use Topics  . Alcohol use: Yes    Comment: wine once per week  . Drug use: No    Family History  Problem Relation Age of Onset  . Leukemia Cousin   . Cancer Mother   . Diabetes Father   . Heart disease Father   . Bleeding Disorder Neg Hx     Allergies  Allergen Reactions  . Gluten Meal Other (See Comments)    sensitivity  . Monosodium Glutamate Other (See Comments)    Hot and feels bad  . Other Other (See Comments)    Red places  . Sesame Oil Rash    Turns bright, ear and anus open and close.  . Tomato Other (See Comments)    Other night shade vegetables like eggplant, potato  . Wheat Bran Other (See Comments)    Stomach issues  . Sulfa Antibiotics Rash    Medication list has been reviewed and updated.  Current Outpatient Medications on File Prior to Visit  Medication Sig Dispense Refill  . CHLOROPHYLL PO Take by mouth.    . Cholecalciferol (VITAMIN D3) 100000 UNIT/GM POWD 2 drops per day    .  Cholecalciferol 1.25 MG (50000 UT) TABS 2 drops per day    . desmopressin (DDAVP NASAL) 0.01 % solution Place 2 sprays (20 mcg total) into the nose daily as needed. Nose bleeds 5 mL 12  . desmopressin (DDAVP) 0.01 % SOLN   10  . magnesium 30 MG tablet Take 30 mg by mouth 2 (two) times daily.    . Nutritional Supplements (NUTRITIONAL SUPPLEMENT PO) Take by mouth.    Marland Kitchen OVER THE COUNTER MEDICATION Take 1 tablet by mouth 2 (two) times daily.    . Sesame Oil OIL by Does not apply route.    Marland Kitchen UNABLE TO FIND Take by mouth.    Marland Kitchen UNABLE TO FIND 3 (three) times a day as needed. BioActive Nutritional V-HP    . UNABLE TO FIND 3 (three) times a day. BioActive Homepathic Immuno Fortifier    . UNABLE TO FIND paraplus    . UNABLE TO FIND Take by mouth.    Marland Kitchen UNABLE TO FIND Take by mouth.    Marland Kitchen UNABLE TO FIND Okra pepcin    . UNABLE TO FIND  Cal-ma    . UNABLE TO FIND daily. allerplex 7 - 2 caps daily    . UNABLE TO FIND Adrenal forte    . UNABLE TO FIND daily. Gabatone extra    . UNABLE TO FIND daily. Hydroside O84 folic acid    . UNABLE TO FIND Colloidal Silver    . UNABLE TO FIND Take by mouth.    Marland Kitchen UNABLE TO FIND Candida formula    . UNABLE TO FIND Take by mouth.    . vitamin B-12 (CYANOCOBALAMIN) 500 MCG tablet Take 500 mcg by mouth daily.     No current facility-administered medications on file prior to visit.    Review of Systems:  As per HPI- otherwise negative.   Physical Examination: Vitals:   01/15/20 1356  BP: 118/76  Pulse: (!) 106  Resp: 16  Temp: (!) 95.6 F (35.3 C)  SpO2: 97%   Vitals:   01/15/20 1356  Weight: 147 lb (66.7 kg)  Height: 5' 6.5" (1.689 m)   Body mass index is 23.37 kg/m. Ideal Body Weight: Weight in (lb) to have BMI = 25: 156.9  GEN: no acute distress.  Normal weight, looks well-her normal self HEENT: Atraumatic, Normocephalic.  Ears and Nose: No external deformity. CV: RRR, No M/G/R. No JVD. No thrill. No extra heart sounds. PULM: CTA B, no wheezes, crackles, rhonchi. No retractions. No resp. distress. No accessory muscle use. ABD: S, NT, ND, +BS. No rebound. No HSM.  Abdomen is benign EXTR: No c/c/e PSYCH: Normally interactive. Conversant.  I am not able to reproduce her pain by pressing on her chest or trunk today  EKG:  NSR- rate of 82.  No ST abnl No old tracing for comparison  Assessment and Plan: Chest pain, unspecified type - Plan: EKG 12-Lead  Here today with concern of "blips" of pain that can occur anywhere in her chest, abdomen, and back.  She describes these as the sensation of being flicked by a finger.  These pains are nonexertional, do not follow any particular pattern.  Her EKG today is normal.  Meredith Dominguez does have some unusual health believes and seeks alternative treatments, this is stable for her.  She currently believes that she has an active  tapeworm.  At the time being I do not see evidence of ACS.  Her EKG is normal and the pain she describes is not  typical of cardiac pain.  I did advise her of characteristics of cardiac pain, asked her to let me know if she develops chest pain or pressure which lasts several minutes, especially associated with exercise.  In that case we would prefer to do further testing such as a treadmill test.  She agrees with this plan and will keep me posted  Moderate medical decision making today  This visit occurred during the SARS-CoV-2 public health emergency.  Safety protocols were in place, including screening questions prior to the visit, additional usage of staff PPE, and extensive cleaning of exam room while observing appropriate contact time as indicated for disinfecting solutions.     Signed Abbe Amsterdam, MD

## 2020-01-15 NOTE — Telephone Encounter (Signed)
FYI

## 2020-01-18 DIAGNOSIS — H25093 Other age-related incipient cataract, bilateral: Secondary | ICD-10-CM | POA: Diagnosis not present

## 2020-02-02 ENCOUNTER — Encounter: Payer: Self-pay | Admitting: Obstetrics & Gynecology

## 2020-02-02 ENCOUNTER — Ambulatory Visit (INDEPENDENT_AMBULATORY_CARE_PROVIDER_SITE_OTHER): Payer: PPO | Admitting: Obstetrics & Gynecology

## 2020-02-02 ENCOUNTER — Other Ambulatory Visit: Payer: Self-pay

## 2020-02-02 ENCOUNTER — Other Ambulatory Visit (HOSPITAL_COMMUNITY)
Admission: RE | Admit: 2020-02-02 | Discharge: 2020-02-02 | Disposition: A | Discharge status: Home / Self Care | Payer: PPO | Source: Ambulatory Visit | Attending: Obstetrics & Gynecology | Admitting: Obstetrics & Gynecology

## 2020-02-02 ENCOUNTER — Other Ambulatory Visit: Payer: Self-pay | Admitting: Obstetrics & Gynecology

## 2020-02-02 VITALS — BP 94/65 | HR 104 | Wt 141.0 lb

## 2020-02-02 DIAGNOSIS — N6311 Unspecified lump in the right breast, upper outer quadrant: Secondary | ICD-10-CM | POA: Diagnosis not present

## 2020-02-02 DIAGNOSIS — Z01419 Encounter for gynecological examination (general) (routine) without abnormal findings: Secondary | ICD-10-CM

## 2020-02-02 DIAGNOSIS — Z1151 Encounter for screening for human papillomavirus (HPV): Secondary | ICD-10-CM | POA: Insufficient documentation

## 2020-02-02 DIAGNOSIS — Z780 Asymptomatic menopausal state: Secondary | ICD-10-CM | POA: Diagnosis not present

## 2020-02-02 NOTE — Progress Notes (Signed)
Patient request exam of left ovary due to pain on that side of abdomen.PMP since age 69. Patient states she has had an increased prolactin level for several years. Armandina Stammer RN
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
## 2020-02-05 LAB — CYTOLOGY - PAP
Comment: NEGATIVE
Diagnosis: NEGATIVE
High risk HPV: NEGATIVE

## 2020-03-01 ENCOUNTER — Other Ambulatory Visit: Payer: PPO

## 2020-03-04 DIAGNOSIS — M9903 Segmental and somatic dysfunction of lumbar region: Secondary | ICD-10-CM | POA: Diagnosis not present

## 2020-03-04 DIAGNOSIS — M5413 Radiculopathy, cervicothoracic region: Secondary | ICD-10-CM | POA: Diagnosis not present

## 2020-03-04 DIAGNOSIS — M5417 Radiculopathy, lumbosacral region: Secondary | ICD-10-CM | POA: Diagnosis not present

## 2020-03-04 DIAGNOSIS — M9902 Segmental and somatic dysfunction of thoracic region: Secondary | ICD-10-CM | POA: Diagnosis not present

## 2020-03-04 DIAGNOSIS — M9901 Segmental and somatic dysfunction of cervical region: Secondary | ICD-10-CM | POA: Diagnosis not present

## 2020-03-04 DIAGNOSIS — M62830 Muscle spasm of back: Secondary | ICD-10-CM | POA: Diagnosis not present

## 2020-03-07 DIAGNOSIS — M62830 Muscle spasm of back: Secondary | ICD-10-CM | POA: Diagnosis not present

## 2020-03-07 DIAGNOSIS — M5413 Radiculopathy, cervicothoracic region: Secondary | ICD-10-CM | POA: Diagnosis not present

## 2020-03-07 DIAGNOSIS — M9903 Segmental and somatic dysfunction of lumbar region: Secondary | ICD-10-CM | POA: Diagnosis not present

## 2020-03-07 DIAGNOSIS — M9902 Segmental and somatic dysfunction of thoracic region: Secondary | ICD-10-CM | POA: Diagnosis not present

## 2020-03-07 DIAGNOSIS — M9901 Segmental and somatic dysfunction of cervical region: Secondary | ICD-10-CM | POA: Diagnosis not present

## 2020-03-07 DIAGNOSIS — M5417 Radiculopathy, lumbosacral region: Secondary | ICD-10-CM | POA: Diagnosis not present

## 2020-03-25 DIAGNOSIS — M9902 Segmental and somatic dysfunction of thoracic region: Secondary | ICD-10-CM | POA: Diagnosis not present

## 2020-03-25 DIAGNOSIS — M9903 Segmental and somatic dysfunction of lumbar region: Secondary | ICD-10-CM | POA: Diagnosis not present

## 2020-03-25 DIAGNOSIS — M62830 Muscle spasm of back: Secondary | ICD-10-CM | POA: Diagnosis not present

## 2020-03-25 DIAGNOSIS — M5417 Radiculopathy, lumbosacral region: Secondary | ICD-10-CM | POA: Diagnosis not present

## 2020-03-25 DIAGNOSIS — M5413 Radiculopathy, cervicothoracic region: Secondary | ICD-10-CM | POA: Diagnosis not present

## 2020-03-25 DIAGNOSIS — M9901 Segmental and somatic dysfunction of cervical region: Secondary | ICD-10-CM | POA: Diagnosis not present

## 2020-04-08 DIAGNOSIS — M5413 Radiculopathy, cervicothoracic region: Secondary | ICD-10-CM | POA: Diagnosis not present

## 2020-04-08 DIAGNOSIS — M5417 Radiculopathy, lumbosacral region: Secondary | ICD-10-CM | POA: Diagnosis not present

## 2020-04-08 DIAGNOSIS — M9902 Segmental and somatic dysfunction of thoracic region: Secondary | ICD-10-CM | POA: Diagnosis not present

## 2020-04-08 DIAGNOSIS — M9903 Segmental and somatic dysfunction of lumbar region: Secondary | ICD-10-CM | POA: Diagnosis not present

## 2020-04-08 DIAGNOSIS — M62830 Muscle spasm of back: Secondary | ICD-10-CM | POA: Diagnosis not present

## 2020-04-08 DIAGNOSIS — M9901 Segmental and somatic dysfunction of cervical region: Secondary | ICD-10-CM | POA: Diagnosis not present

## 2020-04-23 DIAGNOSIS — M5413 Radiculopathy, cervicothoracic region: Secondary | ICD-10-CM | POA: Diagnosis not present

## 2020-04-23 DIAGNOSIS — M62830 Muscle spasm of back: Secondary | ICD-10-CM | POA: Diagnosis not present

## 2020-04-23 DIAGNOSIS — M9903 Segmental and somatic dysfunction of lumbar region: Secondary | ICD-10-CM | POA: Diagnosis not present

## 2020-04-23 DIAGNOSIS — M9902 Segmental and somatic dysfunction of thoracic region: Secondary | ICD-10-CM | POA: Diagnosis not present

## 2020-04-23 DIAGNOSIS — M9901 Segmental and somatic dysfunction of cervical region: Secondary | ICD-10-CM | POA: Diagnosis not present

## 2020-04-23 DIAGNOSIS — M5417 Radiculopathy, lumbosacral region: Secondary | ICD-10-CM | POA: Diagnosis not present

## 2020-05-06 DIAGNOSIS — M5417 Radiculopathy, lumbosacral region: Secondary | ICD-10-CM | POA: Diagnosis not present

## 2020-05-06 DIAGNOSIS — M5413 Radiculopathy, cervicothoracic region: Secondary | ICD-10-CM | POA: Diagnosis not present

## 2020-05-06 DIAGNOSIS — M62830 Muscle spasm of back: Secondary | ICD-10-CM | POA: Diagnosis not present

## 2020-05-06 DIAGNOSIS — M9903 Segmental and somatic dysfunction of lumbar region: Secondary | ICD-10-CM | POA: Diagnosis not present

## 2020-05-06 DIAGNOSIS — M9902 Segmental and somatic dysfunction of thoracic region: Secondary | ICD-10-CM | POA: Diagnosis not present

## 2020-05-06 DIAGNOSIS — M9901 Segmental and somatic dysfunction of cervical region: Secondary | ICD-10-CM | POA: Diagnosis not present

## 2020-05-08 DIAGNOSIS — M9902 Segmental and somatic dysfunction of thoracic region: Secondary | ICD-10-CM | POA: Diagnosis not present

## 2020-05-08 DIAGNOSIS — M5417 Radiculopathy, lumbosacral region: Secondary | ICD-10-CM | POA: Diagnosis not present

## 2020-05-08 DIAGNOSIS — M62830 Muscle spasm of back: Secondary | ICD-10-CM | POA: Diagnosis not present

## 2020-05-08 DIAGNOSIS — M9901 Segmental and somatic dysfunction of cervical region: Secondary | ICD-10-CM | POA: Diagnosis not present

## 2020-05-08 DIAGNOSIS — M9903 Segmental and somatic dysfunction of lumbar region: Secondary | ICD-10-CM | POA: Diagnosis not present

## 2020-05-08 DIAGNOSIS — M5413 Radiculopathy, cervicothoracic region: Secondary | ICD-10-CM | POA: Diagnosis not present

## 2020-05-20 DIAGNOSIS — M5417 Radiculopathy, lumbosacral region: Secondary | ICD-10-CM | POA: Diagnosis not present

## 2020-05-20 DIAGNOSIS — M5413 Radiculopathy, cervicothoracic region: Secondary | ICD-10-CM | POA: Diagnosis not present

## 2020-05-20 DIAGNOSIS — M9902 Segmental and somatic dysfunction of thoracic region: Secondary | ICD-10-CM | POA: Diagnosis not present

## 2020-05-20 DIAGNOSIS — M62830 Muscle spasm of back: Secondary | ICD-10-CM | POA: Diagnosis not present

## 2020-05-20 DIAGNOSIS — M9901 Segmental and somatic dysfunction of cervical region: Secondary | ICD-10-CM | POA: Diagnosis not present

## 2020-05-20 DIAGNOSIS — M9903 Segmental and somatic dysfunction of lumbar region: Secondary | ICD-10-CM | POA: Diagnosis not present

## 2020-06-03 DIAGNOSIS — M5417 Radiculopathy, lumbosacral region: Secondary | ICD-10-CM | POA: Diagnosis not present

## 2020-06-03 DIAGNOSIS — M62830 Muscle spasm of back: Secondary | ICD-10-CM | POA: Diagnosis not present

## 2020-06-03 DIAGNOSIS — M9902 Segmental and somatic dysfunction of thoracic region: Secondary | ICD-10-CM | POA: Diagnosis not present

## 2020-06-03 DIAGNOSIS — M9901 Segmental and somatic dysfunction of cervical region: Secondary | ICD-10-CM | POA: Diagnosis not present

## 2020-06-03 DIAGNOSIS — M9903 Segmental and somatic dysfunction of lumbar region: Secondary | ICD-10-CM | POA: Diagnosis not present

## 2020-06-03 DIAGNOSIS — M5413 Radiculopathy, cervicothoracic region: Secondary | ICD-10-CM | POA: Diagnosis not present

## 2020-06-06 ENCOUNTER — Telehealth: Payer: Self-pay | Admitting: Family Medicine

## 2020-06-06 NOTE — Telephone Encounter (Signed)
Caller: Jenina Call back # [phone]  Patient states she has a bus trip on Saturday. She has cancellation policy on the trip, however, she will need a doctor's note excusing her in order for her to get money back.

## 2020-06-06 NOTE — Telephone Encounter (Signed)
Patient states she "has a premonition that someone on the bus will have covid" She is very anxious going and is requesting a note to dismiss her from the trip. Patient has not been seen since 12/2019. Please advise I will inform the patient on further instruction.

## 2020-06-07 NOTE — Telephone Encounter (Signed)
Left message to return call. Patient can print off letter in [facility] or come pick up!

## 2020-06-24 DIAGNOSIS — M62830 Muscle spasm of back: Secondary | ICD-10-CM | POA: Diagnosis not present

## 2020-06-24 DIAGNOSIS — M9902 Segmental and somatic dysfunction of thoracic region: Secondary | ICD-10-CM | POA: Diagnosis not present

## 2020-06-24 DIAGNOSIS — M5413 Radiculopathy, cervicothoracic region: Secondary | ICD-10-CM | POA: Diagnosis not present

## 2020-06-24 DIAGNOSIS — M9903 Segmental and somatic dysfunction of lumbar region: Secondary | ICD-10-CM | POA: Diagnosis not present

## 2020-06-24 DIAGNOSIS — M9901 Segmental and somatic dysfunction of cervical region: Secondary | ICD-10-CM | POA: Diagnosis not present

## 2020-06-24 DIAGNOSIS — M5417 Radiculopathy, lumbosacral region: Secondary | ICD-10-CM | POA: Diagnosis not present

## 2020-07-08 DIAGNOSIS — M9901 Segmental and somatic dysfunction of cervical region: Secondary | ICD-10-CM | POA: Diagnosis not present

## 2020-07-08 DIAGNOSIS — M9902 Segmental and somatic dysfunction of thoracic region: Secondary | ICD-10-CM | POA: Diagnosis not present

## 2020-07-08 DIAGNOSIS — M5417 Radiculopathy, lumbosacral region: Secondary | ICD-10-CM | POA: Diagnosis not present

## 2020-07-08 DIAGNOSIS — M9903 Segmental and somatic dysfunction of lumbar region: Secondary | ICD-10-CM | POA: Diagnosis not present

## 2020-07-08 DIAGNOSIS — M5413 Radiculopathy, cervicothoracic region: Secondary | ICD-10-CM | POA: Diagnosis not present

## 2020-07-08 DIAGNOSIS — M62830 Muscle spasm of back: Secondary | ICD-10-CM | POA: Diagnosis not present

## 2020-07-22 DIAGNOSIS — M5413 Radiculopathy, cervicothoracic region: Secondary | ICD-10-CM | POA: Diagnosis not present

## 2020-07-22 DIAGNOSIS — M9903 Segmental and somatic dysfunction of lumbar region: Secondary | ICD-10-CM | POA: Diagnosis not present

## 2020-07-22 DIAGNOSIS — M9902 Segmental and somatic dysfunction of thoracic region: Secondary | ICD-10-CM | POA: Diagnosis not present

## 2020-07-22 DIAGNOSIS — M62830 Muscle spasm of back: Secondary | ICD-10-CM | POA: Diagnosis not present

## 2020-07-22 DIAGNOSIS — M5417 Radiculopathy, lumbosacral region: Secondary | ICD-10-CM | POA: Diagnosis not present

## 2020-07-22 DIAGNOSIS — M9901 Segmental and somatic dysfunction of cervical region: Secondary | ICD-10-CM | POA: Diagnosis not present

## 2020-08-05 DIAGNOSIS — M9901 Segmental and somatic dysfunction of cervical region: Secondary | ICD-10-CM | POA: Diagnosis not present

## 2020-08-05 DIAGNOSIS — M5413 Radiculopathy, cervicothoracic region: Secondary | ICD-10-CM | POA: Diagnosis not present

## 2020-08-05 DIAGNOSIS — M5417 Radiculopathy, lumbosacral region: Secondary | ICD-10-CM | POA: Diagnosis not present

## 2020-08-05 DIAGNOSIS — M9903 Segmental and somatic dysfunction of lumbar region: Secondary | ICD-10-CM | POA: Diagnosis not present

## 2020-08-05 DIAGNOSIS — M9902 Segmental and somatic dysfunction of thoracic region: Secondary | ICD-10-CM | POA: Diagnosis not present

## 2020-08-05 DIAGNOSIS — M62830 Muscle spasm of back: Secondary | ICD-10-CM | POA: Diagnosis not present

## 2020-08-19 DIAGNOSIS — M9902 Segmental and somatic dysfunction of thoracic region: Secondary | ICD-10-CM | POA: Diagnosis not present

## 2020-08-19 DIAGNOSIS — M5417 Radiculopathy, lumbosacral region: Secondary | ICD-10-CM | POA: Diagnosis not present

## 2020-08-19 DIAGNOSIS — M5413 Radiculopathy, cervicothoracic region: Secondary | ICD-10-CM | POA: Diagnosis not present

## 2020-08-19 DIAGNOSIS — M9901 Segmental and somatic dysfunction of cervical region: Secondary | ICD-10-CM | POA: Diagnosis not present

## 2020-08-19 DIAGNOSIS — M62830 Muscle spasm of back: Secondary | ICD-10-CM | POA: Diagnosis not present

## 2020-08-19 DIAGNOSIS — M9903 Segmental and somatic dysfunction of lumbar region: Secondary | ICD-10-CM | POA: Diagnosis not present

## 2020-09-02 DIAGNOSIS — M9901 Segmental and somatic dysfunction of cervical region: Secondary | ICD-10-CM | POA: Diagnosis not present

## 2020-09-02 DIAGNOSIS — M9903 Segmental and somatic dysfunction of lumbar region: Secondary | ICD-10-CM | POA: Diagnosis not present

## 2020-09-02 DIAGNOSIS — M5413 Radiculopathy, cervicothoracic region: Secondary | ICD-10-CM | POA: Diagnosis not present

## 2020-09-02 DIAGNOSIS — M9902 Segmental and somatic dysfunction of thoracic region: Secondary | ICD-10-CM | POA: Diagnosis not present

## 2020-09-02 DIAGNOSIS — M62830 Muscle spasm of back: Secondary | ICD-10-CM | POA: Diagnosis not present

## 2020-09-02 DIAGNOSIS — M5417 Radiculopathy, lumbosacral region: Secondary | ICD-10-CM | POA: Diagnosis not present

## 2020-09-16 DIAGNOSIS — M5417 Radiculopathy, lumbosacral region: Secondary | ICD-10-CM | POA: Diagnosis not present

## 2020-09-16 DIAGNOSIS — M9902 Segmental and somatic dysfunction of thoracic region: Secondary | ICD-10-CM | POA: Diagnosis not present

## 2020-09-16 DIAGNOSIS — M9903 Segmental and somatic dysfunction of lumbar region: Secondary | ICD-10-CM | POA: Diagnosis not present

## 2020-09-16 DIAGNOSIS — M62830 Muscle spasm of back: Secondary | ICD-10-CM | POA: Diagnosis not present

## 2020-09-16 DIAGNOSIS — M9901 Segmental and somatic dysfunction of cervical region: Secondary | ICD-10-CM | POA: Diagnosis not present

## 2020-09-16 DIAGNOSIS — M5413 Radiculopathy, cervicothoracic region: Secondary | ICD-10-CM | POA: Diagnosis not present

## 2020-10-07 DIAGNOSIS — M9902 Segmental and somatic dysfunction of thoracic region: Secondary | ICD-10-CM | POA: Diagnosis not present

## 2020-10-07 DIAGNOSIS — M9903 Segmental and somatic dysfunction of lumbar region: Secondary | ICD-10-CM | POA: Diagnosis not present

## 2020-10-07 DIAGNOSIS — M5413 Radiculopathy, cervicothoracic region: Secondary | ICD-10-CM | POA: Diagnosis not present

## 2020-10-07 DIAGNOSIS — M9901 Segmental and somatic dysfunction of cervical region: Secondary | ICD-10-CM | POA: Diagnosis not present

## 2020-10-07 DIAGNOSIS — M62830 Muscle spasm of back: Secondary | ICD-10-CM | POA: Diagnosis not present

## 2020-10-07 DIAGNOSIS — M5417 Radiculopathy, lumbosacral region: Secondary | ICD-10-CM | POA: Diagnosis not present

## 2020-10-21 DIAGNOSIS — M9901 Segmental and somatic dysfunction of cervical region: Secondary | ICD-10-CM | POA: Diagnosis not present

## 2020-10-21 DIAGNOSIS — M5417 Radiculopathy, lumbosacral region: Secondary | ICD-10-CM | POA: Diagnosis not present

## 2020-10-21 DIAGNOSIS — M62830 Muscle spasm of back: Secondary | ICD-10-CM | POA: Diagnosis not present

## 2020-10-21 DIAGNOSIS — M5413 Radiculopathy, cervicothoracic region: Secondary | ICD-10-CM | POA: Diagnosis not present

## 2020-10-21 DIAGNOSIS — M9903 Segmental and somatic dysfunction of lumbar region: Secondary | ICD-10-CM | POA: Diagnosis not present

## 2020-10-21 DIAGNOSIS — M9902 Segmental and somatic dysfunction of thoracic region: Secondary | ICD-10-CM | POA: Diagnosis not present

## 2020-11-04 DIAGNOSIS — M5417 Radiculopathy, lumbosacral region: Secondary | ICD-10-CM | POA: Diagnosis not present

## 2020-11-04 DIAGNOSIS — M9901 Segmental and somatic dysfunction of cervical region: Secondary | ICD-10-CM | POA: Diagnosis not present

## 2020-11-04 DIAGNOSIS — M5413 Radiculopathy, cervicothoracic region: Secondary | ICD-10-CM | POA: Diagnosis not present

## 2020-11-04 DIAGNOSIS — M9902 Segmental and somatic dysfunction of thoracic region: Secondary | ICD-10-CM | POA: Diagnosis not present

## 2020-11-04 DIAGNOSIS — M62830 Muscle spasm of back: Secondary | ICD-10-CM | POA: Diagnosis not present

## 2020-11-04 DIAGNOSIS — M9903 Segmental and somatic dysfunction of lumbar region: Secondary | ICD-10-CM | POA: Diagnosis not present

## 2020-11-18 DIAGNOSIS — M9903 Segmental and somatic dysfunction of lumbar region: Secondary | ICD-10-CM | POA: Diagnosis not present

## 2020-11-18 DIAGNOSIS — M9902 Segmental and somatic dysfunction of thoracic region: Secondary | ICD-10-CM | POA: Diagnosis not present

## 2020-11-18 DIAGNOSIS — M5413 Radiculopathy, cervicothoracic region: Secondary | ICD-10-CM | POA: Diagnosis not present

## 2020-11-18 DIAGNOSIS — M5417 Radiculopathy, lumbosacral region: Secondary | ICD-10-CM | POA: Diagnosis not present

## 2020-11-18 DIAGNOSIS — M9901 Segmental and somatic dysfunction of cervical region: Secondary | ICD-10-CM | POA: Diagnosis not present

## 2020-11-18 DIAGNOSIS — M62830 Muscle spasm of back: Secondary | ICD-10-CM | POA: Diagnosis not present

## 2020-12-02 DIAGNOSIS — M5413 Radiculopathy, cervicothoracic region: Secondary | ICD-10-CM | POA: Diagnosis not present

## 2020-12-02 DIAGNOSIS — M5417 Radiculopathy, lumbosacral region: Secondary | ICD-10-CM | POA: Diagnosis not present

## 2020-12-02 DIAGNOSIS — M9903 Segmental and somatic dysfunction of lumbar region: Secondary | ICD-10-CM | POA: Diagnosis not present

## 2020-12-02 DIAGNOSIS — M9901 Segmental and somatic dysfunction of cervical region: Secondary | ICD-10-CM | POA: Diagnosis not present

## 2020-12-02 DIAGNOSIS — M62830 Muscle spasm of back: Secondary | ICD-10-CM | POA: Diagnosis not present

## 2020-12-02 DIAGNOSIS — M9902 Segmental and somatic dysfunction of thoracic region: Secondary | ICD-10-CM | POA: Diagnosis not present

## 2020-12-10 ENCOUNTER — Other Ambulatory Visit: Payer: Self-pay

## 2020-12-10 ENCOUNTER — Other Ambulatory Visit: Payer: Self-pay | Admitting: Medical

## 2020-12-10 ENCOUNTER — Ambulatory Visit (INDEPENDENT_AMBULATORY_CARE_PROVIDER_SITE_OTHER): Payer: PPO | Admitting: Medical

## 2020-12-10 ENCOUNTER — Ambulatory Visit: Payer: PPO | Admitting: Family

## 2020-12-10 VITALS — BP 124/79 | HR 97 | Resp 18 | Ht 66.0 in | Wt 139.8 lb

## 2020-12-10 DIAGNOSIS — S00512A Abrasion of oral cavity, initial encounter: Secondary | ICD-10-CM

## 2020-12-10 DIAGNOSIS — H938X2 Other specified disorders of left ear: Secondary | ICD-10-CM

## 2020-12-10 DIAGNOSIS — L089 Local infection of the skin and subcutaneous tissue, unspecified: Secondary | ICD-10-CM | POA: Diagnosis not present

## 2020-12-10 MED ORDER — MUPIROCIN 2 % EX OINT: 1.0000 "application " | TOPICAL_OINTMENT | Freq: Two times a day (BID) | CUTANEOUS | 0 refills | Status: DC

## 2020-12-10 MED ORDER — NEOMYCIN-POLYMYXIN-HC 3.5-10000-1 OT SOLN: 3.0000 [drp] | Freq: Four times a day (QID) | OTIC | 0 refills | Status: DC

## 2020-12-10 MED ORDER — MAGIC MOUTHWASH: ORAL | 0 refills | Status: DC

## 2020-12-10 MED FILL — MAGIC MOUTHWASH D/M/L: 10 days supply | Qty: 200 | Fill #0

## 2020-12-10 MED FILL — NEO/POLYMYXIN/HC EAR SOLN: 3.5-10000-1 | 17 days supply | Qty: 10 | Fill #0

## 2020-12-10 MED FILL — MUPIROCIN 2% OINTMENT: 2 | 10 days supply | Qty: 22 | Fill #0

## 2020-12-10 NOTE — Patient Instructions (Addendum)
Small bruise to left buccal mucosa. Likely healing small laceration. May have accidentally bit mucosa. Can use magic mouth wash.  For area near sacral dimple mild pink area but no obvious infection or break down. Use mupirocin twice daily. Can use palmer with vit E in middle of the day.   I do think your heent exam and lungs are normal. Only slight mild redness upper tm. If you have any pain on left side or pressure would give oral antibiotic. Not indicated presently.  After discussion decided to rx cortisporin ear drops.  Some mild ear pressure may be eustachian tube dysfunction. Could use flonase over the counter.  Follow up as regularly scheduled with pcp or as needed

## 2020-12-10 NOTE — Progress Notes (Signed)
Subjective:    Patient ID: Meredith Dominguez, female    DOB: 10-Aug-1951, 70 y.o.   MRN: [id number]  HPI  Pt states small sore area on left side buccal mucosa area.Pt states chiropracter though she may have had infection to mucosa. Pt states hx of tmj and sometimes jaw get sore and jaw misaligned. But no dicloscation. No severe crepitus today.  Also pt has small area at top of buttox/sacral dimple. She thinks area mild pink dc to toilet paper. Pt looked in mirror and saw mild pinkness to sacral dimple area/buttock area. No rectal pain.  Pt also want heent exam. She has hx of allergic rhinitis intermittently in past. Just wants heent exam and wants lungs listened to.    Review of Systems  Constitutional: Negative for chills, fatigue and fever.  HENT:       See hpi.  Respiratory: Negative for cough, chest tightness, shortness of breath and wheezing.   Cardiovascular: Negative for chest pain and palpitations.  Gastrointestinal: Negative for abdominal distention and abdominal pain.  Musculoskeletal: Negative for back pain.  Skin: Negative for rash.       See hpi.  Neurological: Negative for dizziness, seizures and headaches.  Hematological: Negative for adenopathy. Does not bruise/bleed easily.  Psychiatric/Behavioral: Negative for behavioral problems, confusion and sleep disturbance. The patient is not nervous/anxious and is not hyperactive.    Past Medical History:  Diagnosis Date  . Anemia   . Osteoporosis 05/15/2019  . Pharyngitis 05/25/2017  . Von Willebrand disease (HCC)      Social History   Socioeconomic History  . Marital status: Divorced    Spouse name: Not on file  . Number of children: Not on file  . Years of education: Not on file  . Highest education level: Not on file  Occupational History  . Not on file  Tobacco Use  . Smoking status: Never Smoker  . Smokeless tobacco: Never Used  Vaping Use  . Vaping Use: Never used  Substance and Sexual Activity  . Alcohol  use: Yes    Comment: wine once per week  . Drug use: No  . Sexual activity: Not Currently  Other Topics Concern  . Not on file  Social History Narrative  . Not on file   Social Determinants of Health   Financial Resource Strain: Not on file  Food Insecurity: Not on file  Transportation Needs: Not on file  Physical Activity: Not on file  Stress: Not on file  Social Connections: Not on file  Intimate Partner Violence: Not on file    Past Surgical History:  Procedure Laterality Date  . BREAST SURGERY     Breast Biopsy Left x2    Family History  Problem Relation Age of Onset  . Leukemia Cousin   . Cancer Mother   . Diabetes Father   . Heart disease Father   . Bleeding Disorder Neg Hx     Allergies  Allergen Reactions  . Gluten Meal Other (See Comments)    sensitivity  . Monosodium Glutamate Other (See Comments)    Hot and feels bad  . Other Other (See Comments)    Red places  . Sesame Oil Rash    Turns bright, ear and anus open and close.  . Tomato Other (See Comments)    Other night shade vegetables like eggplant, potato  . Wheat Bran Other (See Comments)    Stomach issues  . Sulfa Antibiotics Rash    Current Outpatient Medications on File  Prior to Visit  Medication Sig Dispense Refill  . CHLOROPHYLL PO Take by mouth.    . Cholecalciferol (VITAMIN D3) 100000 UNIT/GM POWD 2 drops per day    . Cholecalciferol 1.25 MG (50000 UT) TABS 2 drops per day    . desmopressin (DDAVP NASAL) 0.01 % solution Place 2 sprays (20 mcg total) into the nose daily as needed. Nose bleeds 5 mL 12  . desmopressin (DDAVP) 0.01 % SOLN   10  . magnesium 30 MG tablet Take 30 mg by mouth 2 (two) times daily.    . Nutritional Supplements (NUTRITIONAL SUPPLEMENT PO) Take by mouth.    Marland Kitchen OVER THE COUNTER MEDICATION Take 1 tablet by mouth 2 (two) times daily.    . Sesame Oil OIL by Does not apply route.    Marland Kitchen UNABLE TO FIND Take by mouth.    Marland Kitchen UNABLE TO FIND 3 (three) times a day as needed.  BioActive Nutritional V-HP    . UNABLE TO FIND 3 (three) times a day. BioActive Homepathic Immuno Fortifier    . UNABLE TO FIND paraplus    . UNABLE TO FIND Take by mouth.    Marland Kitchen UNABLE TO FIND Take by mouth.    Marland Kitchen UNABLE TO FIND Okra pepcin    . UNABLE TO FIND Cal-ma    . UNABLE TO FIND daily. allerplex 7 - 2 caps daily    . UNABLE TO FIND Adrenal forte    . UNABLE TO FIND daily. Gabatone extra    . UNABLE TO FIND daily. Hydroside B12 folic acid    . UNABLE TO FIND Colloidal Silver    . UNABLE TO FIND Take by mouth.    Marland Kitchen UNABLE TO FIND Candida formula    . UNABLE TO FIND Take by mouth.    . vitamin B-12 (CYANOCOBALAMIN) 500 MCG tablet Take 500 mcg by mouth daily.     No current facility-administered medications on file prior to visit.    BP 124/79   Pulse 97   Resp 18   Ht 5\' 6"  (1.676 m)   Wt 139 lb 12.8 oz (63.4 kg)   SpO2 98%   BMI 22.56 kg/m       Objective:   Physical Exam   General Mental Status- Alert. General Appearance- Not in acute distress.   Skin Small pink area sacral dimple region and about one itch below. No swelling. nontender to palpation. No breakdown.   Neck Carotid Arteries- Normal color. Moisture- Normal Moisture. No carotid bruits. No JVD.  Chest and Lung Exam Auscultation: Breath Sounds:-Normal.  Cardiovascular Auscultation:Rythm- Regular. Murmurs & Other Heart Sounds:Auscultation of the heart reveals- No Murmurs.  Abdomen Inspection:-Inspeection Normal. Palpation/Percussion:Note:No mass. Palpation and Percussion of the abdomen reveal- Non Tender, Non Distended + BS, no rebound or guarding.  heent- normal except faint slight red area at top of lt tm. But does not look infected. Canal normal. lt tmj area no obvious pain on palpation. Left buccal mucuosa small bruise about 2 mm in size.  Neurologic Cranial Nerve exam:- CN III-XII intact(No nystagmus), symmetric smile. Drift Test:- No drift. Romberg Exam:- Negative.  Heal to Toe Gait  exam:-Normal. Finger to Nose:- Normal/Intact Strength:- 5/5 equal and symmetric strength both upper and lower extremities.     Assessment & Plan:  Small bruise to left buccal mucosa. Likely healing small laceration. May have accidentally bit mucosa past week. Can use magic mouth wash.  For area near sacral dimple mild pink area but no obvious  infection or break down. Use mupirocin twice daily. Can use palmer with vit E in middle of the day.   I do think your heent exam and lungs are normal. Only slight mild redness upper tm. If you have any pain on left side or pressure would give oral antibiotic. Not indicated presently.  After discussion decided to rx cortisporin ear drops.  Follow up as regularly scheduled with pcp or as needed  Time spent with patient today was 30 minutes which consisted of chart rediew, discussing diagnoses, work up ,treatment, answering questions and documentation.

## 2020-12-20 DIAGNOSIS — M9903 Segmental and somatic dysfunction of lumbar region: Secondary | ICD-10-CM | POA: Diagnosis not present

## 2020-12-20 DIAGNOSIS — M9901 Segmental and somatic dysfunction of cervical region: Secondary | ICD-10-CM | POA: Diagnosis not present

## 2020-12-20 DIAGNOSIS — M5417 Radiculopathy, lumbosacral region: Secondary | ICD-10-CM | POA: Diagnosis not present

## 2020-12-20 DIAGNOSIS — M62830 Muscle spasm of back: Secondary | ICD-10-CM | POA: Diagnosis not present

## 2020-12-20 DIAGNOSIS — M5413 Radiculopathy, cervicothoracic region: Secondary | ICD-10-CM | POA: Diagnosis not present

## 2020-12-20 DIAGNOSIS — M9902 Segmental and somatic dysfunction of thoracic region: Secondary | ICD-10-CM | POA: Diagnosis not present

## 2020-12-30 DIAGNOSIS — M9902 Segmental and somatic dysfunction of thoracic region: Secondary | ICD-10-CM | POA: Diagnosis not present

## 2020-12-30 DIAGNOSIS — M9901 Segmental and somatic dysfunction of cervical region: Secondary | ICD-10-CM | POA: Diagnosis not present

## 2020-12-30 DIAGNOSIS — M5413 Radiculopathy, cervicothoracic region: Secondary | ICD-10-CM | POA: Diagnosis not present

## 2020-12-30 DIAGNOSIS — M5417 Radiculopathy, lumbosacral region: Secondary | ICD-10-CM | POA: Diagnosis not present

## 2020-12-30 DIAGNOSIS — M62830 Muscle spasm of back: Secondary | ICD-10-CM | POA: Diagnosis not present

## 2020-12-30 DIAGNOSIS — M9903 Segmental and somatic dysfunction of lumbar region: Secondary | ICD-10-CM | POA: Diagnosis not present

## 2021-01-16 DIAGNOSIS — M9903 Segmental and somatic dysfunction of lumbar region: Secondary | ICD-10-CM | POA: Diagnosis not present

## 2021-01-16 DIAGNOSIS — M62830 Muscle spasm of back: Secondary | ICD-10-CM | POA: Diagnosis not present

## 2021-01-16 DIAGNOSIS — M9901 Segmental and somatic dysfunction of cervical region: Secondary | ICD-10-CM | POA: Diagnosis not present

## 2021-01-16 DIAGNOSIS — M9902 Segmental and somatic dysfunction of thoracic region: Secondary | ICD-10-CM | POA: Diagnosis not present

## 2021-01-16 DIAGNOSIS — M5417 Radiculopathy, lumbosacral region: Secondary | ICD-10-CM | POA: Diagnosis not present

## 2021-01-16 DIAGNOSIS — M5413 Radiculopathy, cervicothoracic region: Secondary | ICD-10-CM | POA: Diagnosis not present

## 2021-01-27 DIAGNOSIS — M62830 Muscle spasm of back: Secondary | ICD-10-CM | POA: Diagnosis not present

## 2021-01-27 DIAGNOSIS — M5413 Radiculopathy, cervicothoracic region: Secondary | ICD-10-CM | POA: Diagnosis not present

## 2021-01-27 DIAGNOSIS — M9901 Segmental and somatic dysfunction of cervical region: Secondary | ICD-10-CM | POA: Diagnosis not present

## 2021-01-27 DIAGNOSIS — M5417 Radiculopathy, lumbosacral region: Secondary | ICD-10-CM | POA: Diagnosis not present

## 2021-01-27 DIAGNOSIS — M9903 Segmental and somatic dysfunction of lumbar region: Secondary | ICD-10-CM | POA: Diagnosis not present

## 2021-01-27 DIAGNOSIS — M9902 Segmental and somatic dysfunction of thoracic region: Secondary | ICD-10-CM | POA: Diagnosis not present

## 2021-02-07 DIAGNOSIS — M9901 Segmental and somatic dysfunction of cervical region: Secondary | ICD-10-CM | POA: Diagnosis not present

## 2021-02-07 DIAGNOSIS — M62830 Muscle spasm of back: Secondary | ICD-10-CM | POA: Diagnosis not present

## 2021-02-07 DIAGNOSIS — M5413 Radiculopathy, cervicothoracic region: Secondary | ICD-10-CM | POA: Diagnosis not present

## 2021-02-07 DIAGNOSIS — M9902 Segmental and somatic dysfunction of thoracic region: Secondary | ICD-10-CM | POA: Diagnosis not present

## 2021-02-07 DIAGNOSIS — M5417 Radiculopathy, lumbosacral region: Secondary | ICD-10-CM | POA: Diagnosis not present

## 2021-02-07 DIAGNOSIS — M9903 Segmental and somatic dysfunction of lumbar region: Secondary | ICD-10-CM | POA: Diagnosis not present

## 2021-02-17 DIAGNOSIS — M5413 Radiculopathy, cervicothoracic region: Secondary | ICD-10-CM | POA: Diagnosis not present

## 2021-02-17 DIAGNOSIS — M9902 Segmental and somatic dysfunction of thoracic region: Secondary | ICD-10-CM | POA: Diagnosis not present

## 2021-02-17 DIAGNOSIS — M5417 Radiculopathy, lumbosacral region: Secondary | ICD-10-CM | POA: Diagnosis not present

## 2021-02-17 DIAGNOSIS — M9903 Segmental and somatic dysfunction of lumbar region: Secondary | ICD-10-CM | POA: Diagnosis not present

## 2021-02-17 DIAGNOSIS — M62830 Muscle spasm of back: Secondary | ICD-10-CM | POA: Diagnosis not present

## 2021-02-17 DIAGNOSIS — M9901 Segmental and somatic dysfunction of cervical region: Secondary | ICD-10-CM | POA: Diagnosis not present

## 2021-02-27 DIAGNOSIS — M9902 Segmental and somatic dysfunction of thoracic region: Secondary | ICD-10-CM | POA: Diagnosis not present

## 2021-02-27 DIAGNOSIS — M62830 Muscle spasm of back: Secondary | ICD-10-CM | POA: Diagnosis not present

## 2021-02-27 DIAGNOSIS — M5413 Radiculopathy, cervicothoracic region: Secondary | ICD-10-CM | POA: Diagnosis not present

## 2021-02-27 DIAGNOSIS — M9903 Segmental and somatic dysfunction of lumbar region: Secondary | ICD-10-CM | POA: Diagnosis not present

## 2021-02-27 DIAGNOSIS — M9901 Segmental and somatic dysfunction of cervical region: Secondary | ICD-10-CM | POA: Diagnosis not present

## 2021-02-27 DIAGNOSIS — M5417 Radiculopathy, lumbosacral region: Secondary | ICD-10-CM | POA: Diagnosis not present

## 2021-03-07 DIAGNOSIS — M9902 Segmental and somatic dysfunction of thoracic region: Secondary | ICD-10-CM | POA: Diagnosis not present

## 2021-03-07 DIAGNOSIS — M62830 Muscle spasm of back: Secondary | ICD-10-CM | POA: Diagnosis not present

## 2021-03-07 DIAGNOSIS — M5413 Radiculopathy, cervicothoracic region: Secondary | ICD-10-CM | POA: Diagnosis not present

## 2021-03-07 DIAGNOSIS — M9901 Segmental and somatic dysfunction of cervical region: Secondary | ICD-10-CM | POA: Diagnosis not present

## 2021-03-07 DIAGNOSIS — M9903 Segmental and somatic dysfunction of lumbar region: Secondary | ICD-10-CM | POA: Diagnosis not present

## 2021-03-07 DIAGNOSIS — M5417 Radiculopathy, lumbosacral region: Secondary | ICD-10-CM | POA: Diagnosis not present

## 2021-03-14 DIAGNOSIS — M62830 Muscle spasm of back: Secondary | ICD-10-CM | POA: Diagnosis not present

## 2021-03-14 DIAGNOSIS — M9902 Segmental and somatic dysfunction of thoracic region: Secondary | ICD-10-CM | POA: Diagnosis not present

## 2021-03-14 DIAGNOSIS — M9903 Segmental and somatic dysfunction of lumbar region: Secondary | ICD-10-CM | POA: Diagnosis not present

## 2021-03-14 DIAGNOSIS — M5417 Radiculopathy, lumbosacral region: Secondary | ICD-10-CM | POA: Diagnosis not present

## 2021-03-14 DIAGNOSIS — M5413 Radiculopathy, cervicothoracic region: Secondary | ICD-10-CM | POA: Diagnosis not present

## 2021-03-14 DIAGNOSIS — M9901 Segmental and somatic dysfunction of cervical region: Secondary | ICD-10-CM | POA: Diagnosis not present

## 2021-03-27 DIAGNOSIS — M62830 Muscle spasm of back: Secondary | ICD-10-CM | POA: Diagnosis not present

## 2021-03-27 DIAGNOSIS — M9903 Segmental and somatic dysfunction of lumbar region: Secondary | ICD-10-CM | POA: Diagnosis not present

## 2021-03-27 DIAGNOSIS — M9902 Segmental and somatic dysfunction of thoracic region: Secondary | ICD-10-CM | POA: Diagnosis not present

## 2021-03-27 DIAGNOSIS — M5413 Radiculopathy, cervicothoracic region: Secondary | ICD-10-CM | POA: Diagnosis not present

## 2021-03-27 DIAGNOSIS — M5417 Radiculopathy, lumbosacral region: Secondary | ICD-10-CM | POA: Diagnosis not present

## 2021-03-27 DIAGNOSIS — M9901 Segmental and somatic dysfunction of cervical region: Secondary | ICD-10-CM | POA: Diagnosis not present

## 2021-04-08 DIAGNOSIS — M5413 Radiculopathy, cervicothoracic region: Secondary | ICD-10-CM | POA: Diagnosis not present

## 2021-04-08 DIAGNOSIS — M5417 Radiculopathy, lumbosacral region: Secondary | ICD-10-CM | POA: Diagnosis not present

## 2021-04-08 DIAGNOSIS — M62830 Muscle spasm of back: Secondary | ICD-10-CM | POA: Diagnosis not present

## 2021-04-08 DIAGNOSIS — M9903 Segmental and somatic dysfunction of lumbar region: Secondary | ICD-10-CM | POA: Diagnosis not present

## 2021-04-08 DIAGNOSIS — M9902 Segmental and somatic dysfunction of thoracic region: Secondary | ICD-10-CM | POA: Diagnosis not present

## 2021-04-08 DIAGNOSIS — M9901 Segmental and somatic dysfunction of cervical region: Secondary | ICD-10-CM | POA: Diagnosis not present

## 2021-04-17 DIAGNOSIS — M62830 Muscle spasm of back: Secondary | ICD-10-CM | POA: Diagnosis not present

## 2021-04-17 DIAGNOSIS — M9903 Segmental and somatic dysfunction of lumbar region: Secondary | ICD-10-CM | POA: Diagnosis not present

## 2021-04-17 DIAGNOSIS — M9902 Segmental and somatic dysfunction of thoracic region: Secondary | ICD-10-CM | POA: Diagnosis not present

## 2021-04-17 DIAGNOSIS — M5413 Radiculopathy, cervicothoracic region: Secondary | ICD-10-CM | POA: Diagnosis not present

## 2021-04-17 DIAGNOSIS — M9901 Segmental and somatic dysfunction of cervical region: Secondary | ICD-10-CM | POA: Diagnosis not present

## 2021-04-17 DIAGNOSIS — M5417 Radiculopathy, lumbosacral region: Secondary | ICD-10-CM | POA: Diagnosis not present

## 2021-04-24 DIAGNOSIS — M5417 Radiculopathy, lumbosacral region: Secondary | ICD-10-CM | POA: Diagnosis not present

## 2021-04-24 DIAGNOSIS — M9902 Segmental and somatic dysfunction of thoracic region: Secondary | ICD-10-CM | POA: Diagnosis not present

## 2021-04-24 DIAGNOSIS — M62830 Muscle spasm of back: Secondary | ICD-10-CM | POA: Diagnosis not present

## 2021-04-24 DIAGNOSIS — M5413 Radiculopathy, cervicothoracic region: Secondary | ICD-10-CM | POA: Diagnosis not present

## 2021-04-24 DIAGNOSIS — M9903 Segmental and somatic dysfunction of lumbar region: Secondary | ICD-10-CM | POA: Diagnosis not present

## 2021-04-24 DIAGNOSIS — M9901 Segmental and somatic dysfunction of cervical region: Secondary | ICD-10-CM | POA: Diagnosis not present

## 2021-05-01 DIAGNOSIS — M9902 Segmental and somatic dysfunction of thoracic region: Secondary | ICD-10-CM | POA: Diagnosis not present

## 2021-05-01 DIAGNOSIS — M9903 Segmental and somatic dysfunction of lumbar region: Secondary | ICD-10-CM | POA: Diagnosis not present

## 2021-05-01 DIAGNOSIS — M62830 Muscle spasm of back: Secondary | ICD-10-CM | POA: Diagnosis not present

## 2021-05-01 DIAGNOSIS — M9901 Segmental and somatic dysfunction of cervical region: Secondary | ICD-10-CM | POA: Diagnosis not present

## 2021-05-01 DIAGNOSIS — M5413 Radiculopathy, cervicothoracic region: Secondary | ICD-10-CM | POA: Diagnosis not present

## 2021-05-01 DIAGNOSIS — M5417 Radiculopathy, lumbosacral region: Secondary | ICD-10-CM | POA: Diagnosis not present

## 2021-05-12 DIAGNOSIS — M5413 Radiculopathy, cervicothoracic region: Secondary | ICD-10-CM | POA: Diagnosis not present

## 2021-05-12 DIAGNOSIS — M9903 Segmental and somatic dysfunction of lumbar region: Secondary | ICD-10-CM | POA: Diagnosis not present

## 2021-05-12 DIAGNOSIS — M9902 Segmental and somatic dysfunction of thoracic region: Secondary | ICD-10-CM | POA: Diagnosis not present

## 2021-05-12 DIAGNOSIS — M5417 Radiculopathy, lumbosacral region: Secondary | ICD-10-CM | POA: Diagnosis not present

## 2021-05-12 DIAGNOSIS — M62830 Muscle spasm of back: Secondary | ICD-10-CM | POA: Diagnosis not present

## 2021-05-12 DIAGNOSIS — M9901 Segmental and somatic dysfunction of cervical region: Secondary | ICD-10-CM | POA: Diagnosis not present

## 2021-05-19 DIAGNOSIS — M5413 Radiculopathy, cervicothoracic region: Secondary | ICD-10-CM | POA: Diagnosis not present

## 2021-05-19 DIAGNOSIS — M9903 Segmental and somatic dysfunction of lumbar region: Secondary | ICD-10-CM | POA: Diagnosis not present

## 2021-05-19 DIAGNOSIS — M9902 Segmental and somatic dysfunction of thoracic region: Secondary | ICD-10-CM | POA: Diagnosis not present

## 2021-05-19 DIAGNOSIS — M62830 Muscle spasm of back: Secondary | ICD-10-CM | POA: Diagnosis not present

## 2021-05-19 DIAGNOSIS — M9901 Segmental and somatic dysfunction of cervical region: Secondary | ICD-10-CM | POA: Diagnosis not present

## 2021-05-19 DIAGNOSIS — M5417 Radiculopathy, lumbosacral region: Secondary | ICD-10-CM | POA: Diagnosis not present

## 2021-05-28 DIAGNOSIS — M5417 Radiculopathy, lumbosacral region: Secondary | ICD-10-CM | POA: Diagnosis not present

## 2021-05-28 DIAGNOSIS — M62830 Muscle spasm of back: Secondary | ICD-10-CM | POA: Diagnosis not present

## 2021-05-28 DIAGNOSIS — M9902 Segmental and somatic dysfunction of thoracic region: Secondary | ICD-10-CM | POA: Diagnosis not present

## 2021-05-28 DIAGNOSIS — M5413 Radiculopathy, cervicothoracic region: Secondary | ICD-10-CM | POA: Diagnosis not present

## 2021-05-28 DIAGNOSIS — M9903 Segmental and somatic dysfunction of lumbar region: Secondary | ICD-10-CM | POA: Diagnosis not present

## 2021-05-28 DIAGNOSIS — M9901 Segmental and somatic dysfunction of cervical region: Secondary | ICD-10-CM | POA: Diagnosis not present

## 2021-06-17 DIAGNOSIS — M5417 Radiculopathy, lumbosacral region: Secondary | ICD-10-CM | POA: Diagnosis not present

## 2021-06-17 DIAGNOSIS — M62830 Muscle spasm of back: Secondary | ICD-10-CM | POA: Diagnosis not present

## 2021-06-17 DIAGNOSIS — M5413 Radiculopathy, cervicothoracic region: Secondary | ICD-10-CM | POA: Diagnosis not present

## 2021-06-17 DIAGNOSIS — M9901 Segmental and somatic dysfunction of cervical region: Secondary | ICD-10-CM | POA: Diagnosis not present

## 2021-06-17 DIAGNOSIS — M9902 Segmental and somatic dysfunction of thoracic region: Secondary | ICD-10-CM | POA: Diagnosis not present

## 2021-06-17 DIAGNOSIS — M9903 Segmental and somatic dysfunction of lumbar region: Secondary | ICD-10-CM | POA: Diagnosis not present

## 2021-06-27 DIAGNOSIS — M5417 Radiculopathy, lumbosacral region: Secondary | ICD-10-CM | POA: Diagnosis not present

## 2021-06-27 DIAGNOSIS — M62830 Muscle spasm of back: Secondary | ICD-10-CM | POA: Diagnosis not present

## 2021-06-27 DIAGNOSIS — M5413 Radiculopathy, cervicothoracic region: Secondary | ICD-10-CM | POA: Diagnosis not present

## 2021-06-27 DIAGNOSIS — M9903 Segmental and somatic dysfunction of lumbar region: Secondary | ICD-10-CM | POA: Diagnosis not present

## 2021-06-27 DIAGNOSIS — M9901 Segmental and somatic dysfunction of cervical region: Secondary | ICD-10-CM | POA: Diagnosis not present

## 2021-06-27 DIAGNOSIS — M9902 Segmental and somatic dysfunction of thoracic region: Secondary | ICD-10-CM | POA: Diagnosis not present

## 2021-07-07 DIAGNOSIS — M5417 Radiculopathy, lumbosacral region: Secondary | ICD-10-CM | POA: Diagnosis not present

## 2021-07-07 DIAGNOSIS — M5413 Radiculopathy, cervicothoracic region: Secondary | ICD-10-CM | POA: Diagnosis not present

## 2021-07-07 DIAGNOSIS — M9903 Segmental and somatic dysfunction of lumbar region: Secondary | ICD-10-CM | POA: Diagnosis not present

## 2021-07-07 DIAGNOSIS — M62830 Muscle spasm of back: Secondary | ICD-10-CM | POA: Diagnosis not present

## 2021-07-07 DIAGNOSIS — M9901 Segmental and somatic dysfunction of cervical region: Secondary | ICD-10-CM | POA: Diagnosis not present

## 2021-07-07 DIAGNOSIS — M9902 Segmental and somatic dysfunction of thoracic region: Secondary | ICD-10-CM | POA: Diagnosis not present

## 2021-07-14 DIAGNOSIS — M62830 Muscle spasm of back: Secondary | ICD-10-CM | POA: Diagnosis not present

## 2021-07-14 DIAGNOSIS — M9903 Segmental and somatic dysfunction of lumbar region: Secondary | ICD-10-CM | POA: Diagnosis not present

## 2021-07-14 DIAGNOSIS — M5413 Radiculopathy, cervicothoracic region: Secondary | ICD-10-CM | POA: Diagnosis not present

## 2021-07-14 DIAGNOSIS — M9902 Segmental and somatic dysfunction of thoracic region: Secondary | ICD-10-CM | POA: Diagnosis not present

## 2021-07-14 DIAGNOSIS — M5417 Radiculopathy, lumbosacral region: Secondary | ICD-10-CM | POA: Diagnosis not present

## 2021-07-14 DIAGNOSIS — M9901 Segmental and somatic dysfunction of cervical region: Secondary | ICD-10-CM | POA: Diagnosis not present

## 2021-07-21 DIAGNOSIS — M62830 Muscle spasm of back: Secondary | ICD-10-CM | POA: Diagnosis not present

## 2021-07-21 DIAGNOSIS — M9903 Segmental and somatic dysfunction of lumbar region: Secondary | ICD-10-CM | POA: Diagnosis not present

## 2021-07-21 DIAGNOSIS — M5417 Radiculopathy, lumbosacral region: Secondary | ICD-10-CM | POA: Diagnosis not present

## 2021-07-21 DIAGNOSIS — M9901 Segmental and somatic dysfunction of cervical region: Secondary | ICD-10-CM | POA: Diagnosis not present

## 2021-07-21 DIAGNOSIS — M9902 Segmental and somatic dysfunction of thoracic region: Secondary | ICD-10-CM | POA: Diagnosis not present

## 2021-07-21 DIAGNOSIS — M5413 Radiculopathy, cervicothoracic region: Secondary | ICD-10-CM | POA: Diagnosis not present

## 2021-07-29 DIAGNOSIS — M5417 Radiculopathy, lumbosacral region: Secondary | ICD-10-CM | POA: Diagnosis not present

## 2021-07-29 DIAGNOSIS — M62830 Muscle spasm of back: Secondary | ICD-10-CM | POA: Diagnosis not present

## 2021-07-29 DIAGNOSIS — M9901 Segmental and somatic dysfunction of cervical region: Secondary | ICD-10-CM | POA: Diagnosis not present

## 2021-07-29 DIAGNOSIS — M5413 Radiculopathy, cervicothoracic region: Secondary | ICD-10-CM | POA: Diagnosis not present

## 2021-07-29 DIAGNOSIS — M9903 Segmental and somatic dysfunction of lumbar region: Secondary | ICD-10-CM | POA: Diagnosis not present

## 2021-07-29 DIAGNOSIS — M9902 Segmental and somatic dysfunction of thoracic region: Secondary | ICD-10-CM | POA: Diagnosis not present

## 2021-08-04 DIAGNOSIS — M9902 Segmental and somatic dysfunction of thoracic region: Secondary | ICD-10-CM | POA: Diagnosis not present

## 2021-08-04 DIAGNOSIS — M62830 Muscle spasm of back: Secondary | ICD-10-CM | POA: Diagnosis not present

## 2021-08-04 DIAGNOSIS — M9903 Segmental and somatic dysfunction of lumbar region: Secondary | ICD-10-CM | POA: Diagnosis not present

## 2021-08-04 DIAGNOSIS — M5413 Radiculopathy, cervicothoracic region: Secondary | ICD-10-CM | POA: Diagnosis not present

## 2021-08-04 DIAGNOSIS — M9901 Segmental and somatic dysfunction of cervical region: Secondary | ICD-10-CM | POA: Diagnosis not present

## 2021-08-04 DIAGNOSIS — M5417 Radiculopathy, lumbosacral region: Secondary | ICD-10-CM | POA: Diagnosis not present

## 2021-08-11 DIAGNOSIS — M9903 Segmental and somatic dysfunction of lumbar region: Secondary | ICD-10-CM | POA: Diagnosis not present

## 2021-08-11 DIAGNOSIS — M9901 Segmental and somatic dysfunction of cervical region: Secondary | ICD-10-CM | POA: Diagnosis not present

## 2021-08-11 DIAGNOSIS — M5413 Radiculopathy, cervicothoracic region: Secondary | ICD-10-CM | POA: Diagnosis not present

## 2021-08-11 DIAGNOSIS — M62830 Muscle spasm of back: Secondary | ICD-10-CM | POA: Diagnosis not present

## 2021-08-11 DIAGNOSIS — M9902 Segmental and somatic dysfunction of thoracic region: Secondary | ICD-10-CM | POA: Diagnosis not present

## 2021-08-11 DIAGNOSIS — M5417 Radiculopathy, lumbosacral region: Secondary | ICD-10-CM | POA: Diagnosis not present

## 2021-08-12 ENCOUNTER — Other Ambulatory Visit: Payer: Self-pay

## 2021-08-12 ENCOUNTER — Telehealth: Payer: PPO | Admitting: Family Medicine

## 2021-08-12 DIAGNOSIS — U071 COVID-19: Secondary | ICD-10-CM | POA: Diagnosis not present

## 2021-08-25 DIAGNOSIS — M9903 Segmental and somatic dysfunction of lumbar region: Secondary | ICD-10-CM | POA: Diagnosis not present

## 2021-08-25 DIAGNOSIS — M62830 Muscle spasm of back: Secondary | ICD-10-CM | POA: Diagnosis not present

## 2021-08-25 DIAGNOSIS — M9902 Segmental and somatic dysfunction of thoracic region: Secondary | ICD-10-CM | POA: Diagnosis not present

## 2021-08-25 DIAGNOSIS — M5417 Radiculopathy, lumbosacral region: Secondary | ICD-10-CM | POA: Diagnosis not present

## 2021-08-25 DIAGNOSIS — M9901 Segmental and somatic dysfunction of cervical region: Secondary | ICD-10-CM | POA: Diagnosis not present

## 2021-08-25 DIAGNOSIS — M5413 Radiculopathy, cervicothoracic region: Secondary | ICD-10-CM | POA: Diagnosis not present

## 2021-09-01 DIAGNOSIS — M9901 Segmental and somatic dysfunction of cervical region: Secondary | ICD-10-CM | POA: Diagnosis not present

## 2021-09-01 DIAGNOSIS — M9902 Segmental and somatic dysfunction of thoracic region: Secondary | ICD-10-CM | POA: Diagnosis not present

## 2021-09-01 DIAGNOSIS — M5413 Radiculopathy, cervicothoracic region: Secondary | ICD-10-CM | POA: Diagnosis not present

## 2021-09-01 DIAGNOSIS — M9903 Segmental and somatic dysfunction of lumbar region: Secondary | ICD-10-CM | POA: Diagnosis not present

## 2021-09-01 DIAGNOSIS — M62830 Muscle spasm of back: Secondary | ICD-10-CM | POA: Diagnosis not present

## 2021-09-01 DIAGNOSIS — M5417 Radiculopathy, lumbosacral region: Secondary | ICD-10-CM | POA: Diagnosis not present

## 2021-09-08 DIAGNOSIS — M62830 Muscle spasm of back: Secondary | ICD-10-CM | POA: Diagnosis not present

## 2021-09-08 DIAGNOSIS — M5413 Radiculopathy, cervicothoracic region: Secondary | ICD-10-CM | POA: Diagnosis not present

## 2021-09-08 DIAGNOSIS — M9901 Segmental and somatic dysfunction of cervical region: Secondary | ICD-10-CM | POA: Diagnosis not present

## 2021-09-08 DIAGNOSIS — M5417 Radiculopathy, lumbosacral region: Secondary | ICD-10-CM | POA: Diagnosis not present

## 2021-09-08 DIAGNOSIS — M9902 Segmental and somatic dysfunction of thoracic region: Secondary | ICD-10-CM | POA: Diagnosis not present

## 2021-09-08 DIAGNOSIS — M9903 Segmental and somatic dysfunction of lumbar region: Secondary | ICD-10-CM | POA: Diagnosis not present

## 2021-09-15 DIAGNOSIS — M9901 Segmental and somatic dysfunction of cervical region: Secondary | ICD-10-CM | POA: Diagnosis not present

## 2021-09-15 DIAGNOSIS — M9902 Segmental and somatic dysfunction of thoracic region: Secondary | ICD-10-CM | POA: Diagnosis not present

## 2021-09-15 DIAGNOSIS — M5413 Radiculopathy, cervicothoracic region: Secondary | ICD-10-CM | POA: Diagnosis not present

## 2021-09-15 DIAGNOSIS — M9903 Segmental and somatic dysfunction of lumbar region: Secondary | ICD-10-CM | POA: Diagnosis not present

## 2021-09-15 DIAGNOSIS — M5417 Radiculopathy, lumbosacral region: Secondary | ICD-10-CM | POA: Diagnosis not present

## 2021-09-15 DIAGNOSIS — M62830 Muscle spasm of back: Secondary | ICD-10-CM | POA: Diagnosis not present

## 2021-09-22 DIAGNOSIS — M5417 Radiculopathy, lumbosacral region: Secondary | ICD-10-CM | POA: Diagnosis not present

## 2021-09-22 DIAGNOSIS — M62830 Muscle spasm of back: Secondary | ICD-10-CM | POA: Diagnosis not present

## 2021-09-22 DIAGNOSIS — M9901 Segmental and somatic dysfunction of cervical region: Secondary | ICD-10-CM | POA: Diagnosis not present

## 2021-09-22 DIAGNOSIS — M9903 Segmental and somatic dysfunction of lumbar region: Secondary | ICD-10-CM | POA: Diagnosis not present

## 2021-09-22 DIAGNOSIS — M9902 Segmental and somatic dysfunction of thoracic region: Secondary | ICD-10-CM | POA: Diagnosis not present

## 2021-09-22 DIAGNOSIS — M5413 Radiculopathy, cervicothoracic region: Secondary | ICD-10-CM | POA: Diagnosis not present

## 2021-09-26 DIAGNOSIS — M9902 Segmental and somatic dysfunction of thoracic region: Secondary | ICD-10-CM | POA: Diagnosis not present

## 2021-09-26 DIAGNOSIS — M5413 Radiculopathy, cervicothoracic region: Secondary | ICD-10-CM | POA: Diagnosis not present

## 2021-09-26 DIAGNOSIS — M62830 Muscle spasm of back: Secondary | ICD-10-CM | POA: Diagnosis not present

## 2021-09-26 DIAGNOSIS — M5417 Radiculopathy, lumbosacral region: Secondary | ICD-10-CM | POA: Diagnosis not present

## 2021-09-26 DIAGNOSIS — M9903 Segmental and somatic dysfunction of lumbar region: Secondary | ICD-10-CM | POA: Diagnosis not present

## 2021-09-26 DIAGNOSIS — M9901 Segmental and somatic dysfunction of cervical region: Secondary | ICD-10-CM | POA: Diagnosis not present

## 2021-10-06 DIAGNOSIS — M9901 Segmental and somatic dysfunction of cervical region: Secondary | ICD-10-CM | POA: Diagnosis not present

## 2021-10-06 DIAGNOSIS — M9903 Segmental and somatic dysfunction of lumbar region: Secondary | ICD-10-CM | POA: Diagnosis not present

## 2021-10-06 DIAGNOSIS — M9902 Segmental and somatic dysfunction of thoracic region: Secondary | ICD-10-CM | POA: Diagnosis not present

## 2021-10-06 DIAGNOSIS — M5417 Radiculopathy, lumbosacral region: Secondary | ICD-10-CM | POA: Diagnosis not present

## 2021-10-06 DIAGNOSIS — M62830 Muscle spasm of back: Secondary | ICD-10-CM | POA: Diagnosis not present

## 2021-10-06 DIAGNOSIS — M5413 Radiculopathy, cervicothoracic region: Secondary | ICD-10-CM | POA: Diagnosis not present

## 2021-10-13 ENCOUNTER — Other Ambulatory Visit: Payer: Self-pay

## 2021-10-13 ENCOUNTER — Ambulatory Visit (INDEPENDENT_AMBULATORY_CARE_PROVIDER_SITE_OTHER): Payer: PPO

## 2021-10-13 VITALS — BP 117/81 | HR 91 | Temp 97.9°F | Resp 16 | Ht 66.0 in | Wt 137.6 lb

## 2021-10-13 DIAGNOSIS — Z0000 Encounter for general adult medical examination without abnormal findings: Secondary | ICD-10-CM | POA: Diagnosis not present

## 2021-10-13 NOTE — Patient Instructions (Addendum)
Meredith Dominguez , Thank you for taking time to come for your Medicare Wellness Visit. I appreciate your ongoing commitment to your health goals. Please review the following plan we discussed and let me know if I can assist you in the future.   Screening recommendations/referrals: Colonoscopy: Declined  Mammogram: Declined.  Bone Density: Declined Recommended yearly ophthalmology/optometry visit for glaucoma screening and checkup Recommended yearly dental visit for hygiene and checkup  Vaccinations: Influenza vaccine: Declined Pneumococcal vaccine: Declined Tdap vaccine: Declined Shingles vaccine: Declined   Covid-19:Declined  Advanced directives: Please bring a copy of Living Will and/or Healthcare Power of Attorney for your chart.   Conditions/risks identified: See problem list  Next appointment: Follow up in one year for your annual wellness visit 10/19/2022 @3 :40   Preventive Care 65 Years and Older, Female Preventive care refers to lifestyle choices and visits with your health care provider that can promote health and wellness. What does preventive care include? A yearly physical exam. This is also called an annual well check. Dental exams once or twice a year. Routine eye exams. Ask your health care provider how often you should have your eyes checked. Personal lifestyle choices, including: Daily care of your teeth and gums. Regular physical activity. Eating a healthy diet. Avoiding tobacco and drug use. Limiting alcohol use. Practicing safe sex. Taking low-dose aspirin every day. Taking vitamin and mineral supplements as recommended by your health care provider. What happens during an annual well check? The services and screenings done by your health care provider during your annual well check will depend on your age, overall health, lifestyle risk factors, and family history of disease. Counseling  Your health care provider may ask you questions about your: Alcohol  use. Tobacco use. Drug use. Emotional well-being. Home and relationship well-being. Sexual activity. Eating habits. History of falls. Memory and ability to understand (cognition). Work and work . Reproductive health. Screening  You may have the following tests or measurements: Height, weight, and BMI. Blood pressure. Lipid and cholesterol levels. These may be checked every 5 years, or more frequently if you are over 34 years old. Skin check. Lung cancer screening. You may have this screening every year starting at age 35 if you have a 30-pack-year history of smoking and currently smoke or have quit within the past 15 years. Fecal occult blood test (FOBT) of the stool. You may have this test every year starting at age 84. Flexible sigmoidoscopy or colonoscopy. You may have a sigmoidoscopy every 5 years or a colonoscopy every 10 years starting at age 8. Hepatitis C blood test. Hepatitis B blood test. Sexually transmitted disease (STD) testing. Diabetes screening. This is done by checking your blood sugar (glucose) after you have not eaten for a while (fasting). You may have this done every 1-3 years. Bone density scan. This is done to screen for osteoporosis. You may have this done starting at age 73. Mammogram. This may be done every 1-2 years. Talk to your health care provider about how often you should have regular mammograms. Talk with your health care provider about your test results, treatment options, and if necessary, the need for more tests. Vaccines  Your health care provider may recommend certain vaccines, such as: Influenza vaccine. This is recommended every year. Tetanus, diphtheria, and acellular pertussis (Tdap, Td) vaccine. You may need a Td booster every 10 years. Zoster vaccine. You may need this after age 3. Pneumococcal 13-valent conjugate (PCV13) vaccine. One dose is recommended after age 81. Pneumococcal polysaccharide (PPSV23)  vaccine. One dose is  recommended after age 41. Talk to your health care provider about which screenings and vaccines you need and how often you need them. This information is not intended to replace advice given to you by your health care provider. Make sure you discuss any questions you have with your health care provider. Document Released: 12/06/2015 Document Revised: 07/29/2016 Document Reviewed: 09/10/2015 Elsevier Interactive Patient Education  2017 [employer] Prevention in the Home Falls can cause injuries. They can happen to people of all ages. There are many things you can do to make your home safe and to help prevent falls. What can I do on the outside of my home? Regularly fix the edges of walkways and driveways and fix any cracks. Remove anything that might make you trip as you walk through a door, such as a raised step or threshold. Trim any bushes or trees on the path to your home. Use bright outdoor lighting. Clear any walking paths of anything that might make someone trip, such as rocks or tools. Regularly check to see if handrails are loose or broken. Make sure that both sides of any steps have handrails. Any raised decks and porches should have guardrails on the edges. Have any leaves, snow, or ice cleared regularly. Use sand or salt on walking paths during winter. Clean up any spills in your garage right away. This includes oil or grease spills. What can I do in the bathroom? Use night lights. Install grab bars by the toilet and in the tub and shower. Do not use towel bars as grab bars. Use non-skid mats or decals in the tub or shower. If you need to sit down in the shower, use a plastic, non-slip stool. Keep the floor dry. Clean up any water that spills on the floor as soon as it happens. Remove soap buildup in the tub or shower regularly. Attach bath mats securely with double-sided non-slip rug tape. Do not have throw rugs and other things on the floor that can make you  trip. What can I do in the bedroom? Use night lights. Make sure that you have a light by your bed that is easy to reach. Do not use any sheets or blankets that are too big for your bed. They should not hang down onto the floor. Have a firm chair that has side arms. You can use this for support while you get dressed. Do not have throw rugs and other things on the floor that can make you trip. What can I do in the kitchen? Clean up any spills right away. Avoid walking on wet floors. Keep items that you use a lot in easy-to-reach places. If you need to reach something above you, use a strong step stool that has a grab bar. Keep electrical cords out of the way. Do not use floor polish or wax that makes floors slippery. If you must use wax, use non-skid floor wax. Do not have throw rugs and other things on the floor that can make you trip. What can I do with my stairs? Do not leave any items on the stairs. Make sure that there are handrails on both sides of the stairs and use them. Fix handrails that are broken or loose. Make sure that handrails are as long as the stairways. Check any carpeting to make sure that it is firmly attached to the stairs. Fix any carpet that is loose or worn. Avoid having throw rugs at the top or bottom  of the stairs. If you do have throw rugs, attach them to the floor with carpet tape. Make sure that you have a light switch at the top of the stairs and the bottom of the stairs. If you do not have them, ask someone to add them for you. What else can I do to help prevent falls? Wear shoes that: Do not have high heels. Have rubber bottoms. Are comfortable and fit you well. Are closed at the toe. Do not wear sandals. If you use a stepladder: Make sure that it is fully opened. Do not climb a closed stepladder. Make sure that both sides of the stepladder are locked into place. Ask someone to hold it for you, if possible. Clearly mark and make sure that you can  see: Any grab bars or handrails. First and last steps. Where the edge of each step is. Use tools that help you move around (mobility aids) if they are needed. These include: Canes. Walkers. Scooters. Crutches. Turn on the lights when you go into a dark area. Replace any light bulbs as soon as they burn out. Set up your furniture so you have a clear path. Avoid moving your furniture around. If any of your floors are uneven, fix them. If there are any pets around you, be aware of where they are. Review your medicines with your doctor. Some medicines can make you feel dizzy. This can increase your chance of falling. Ask your doctor what other things that you can do to help prevent falls. This information is not intended to replace advice given to you by your health care provider. Make sure you discuss any questions you have with your health care provider. Document Released: 09/05/2009 Document Revised: 04/16/2016 Document Reviewed: 12/14/2014 Elsevier Interactive Patient Education  2017 [employer].

## 2021-10-13 NOTE — Progress Notes (Signed)
Subjective:   Meredith Dominguez is a 70 y.o. female who presents for Medicare Annual (Subsequent) preventive examination.  Review of Systems     Cardiac Risk Factors include: advanced age (>99mn, >>63women)     Objective:    Today's Vitals   10/13/21 1527  BP: 117/81  Pulse: 91  Resp: 16  Temp: 97.9 F (36.6 C)  TempSrc: Temporal  SpO2: 98%  Weight: 137 lb 9.6 oz (62.4 kg)  Height: '5\' 6"'  (1.676 m)   Body mass index is 22.21 kg/m.  Advanced Directives 12/21/2019 12/13/2019 05/05/2019 04/29/2018 08/05/2017 07/16/2017 05/21/2017  Does Patient Have a Medical Advance Directive? No No No No No No No  Type of Advance Directive - - - - - - -  Copy of Healthcare Power of Attorney in Chart? - - - - - - -  Would patient like information on creating a medical advance directive? No - Patient declined - No - Patient declined Yes (MAU/Ambulatory/Procedural Areas - Information given) - - -    Current Medications (verified) Outpatient Encounter Medications as of 10/13/2021  Medication Sig   desmopressin (DDAVP) 0.01 % SOLN    magnesium 30 MG tablet Take 30 mg by mouth 2 (two) times daily.   Nutritional Supplements (NUTRITIONAL SUPPLEMENT PO) Take by mouth.   OVER THE COUNTER MEDICATION Take 1 tablet by mouth 2 (two) times daily.   UNABLE TO FIND 3 (three) times a day as needed. BioActive Nutritional V-HP   UNABLE TO FIND paraplus   UNABLE TO FIND Take by mouth.   UNABLE TO FIND Take by mouth.   UNABLE TO FIND Cal-ma   UNABLE TO FIND daily. allerplex 7 - 2 caps daily   UNABLE TO FIND Adrenal forte   UNABLE TO FIND daily. Hydroside BB14folic acid   UNABLE TO FIND Colloidal Silver   UNABLE TO FIND Take by mouth.   UNABLE TO FIND Candida formula   UNABLE TO FIND Take by mouth.   vitamin B-12 (CYANOCOBALAMIN) 500 MCG tablet Take 500 mcg by mouth daily.   CHLOROPHYLL PO Take by mouth.   Cholecalciferol (VITAMIN D3) 100000 UNIT/GM POWD 2 drops per day   Cholecalciferol 1.25 MG (50000 UT)  TABS 2 drops per day   desmopressin (DDAVP NASAL) 0.01 % solution Place 2 sprays (20 mcg total) into the nose daily as needed. Nose bleeds (Patient not taking: Reported on 10/13/2021)   magic mouthwash SOLN 5 ml po qid swish and spit   mupirocin ointment (BACTROBAN) 2 % APPLY 1 APPLICATION TOPICALLY 2 (TWO) TIMES DAILY.   neomycin-polymyxin-hydrocortisone (CORTISPORIN) OTIC solution PLACE 3 DROPS INTO THE LEFT EAR 4 (FOUR) TIMES DAILY.   Sesame Oil OIL by Does not apply route.   UNABLE TO FIND Take by mouth.   UNABLE TO FIND 3 (three) times a day. BioActive Homepathic Immuno Fortifier   UNABLE TO FIND Okra pepcin   UNABLE TO FIND daily. Gabatone extra   No facility-administered encounter medications on file as of 10/13/2021.    Allergies (verified) Gluten meal, Monosodium glutamate, Other, Sesame oil, Tomato, Wheat bran, and Sulfa antibiotics   History: Past Medical History:  Diagnosis Date   Anemia    Osteoporosis 05/15/2019   Pharyngitis 05/25/2017   Von Willebrand disease    Past Surgical History:  Procedure Laterality Date   BREAST SURGERY     Breast Biopsy Left x2   Family History  Problem Relation Age of Onset   Leukemia Cousin    Cancer  Mother    Diabetes Father    Heart disease Father    Bleeding Disorder Neg Hx    Social History   Socioeconomic History   Marital status: Divorced    Spouse name: Not on file   Number of children: Not on file   Years of education: Not on file   Highest education level: Not on file  Occupational History   Not on file  Tobacco Use   Smoking status: Never   Smokeless tobacco: Never  Vaping Use   Vaping Use: Never used  Substance and Sexual Activity   Alcohol use: Yes    Comment: wine once per week   Drug use: No   Sexual activity: Not Currently  Other Topics Concern   Not on file  Social History Narrative   Not on file   Social Determinants of Health   Financial Resource Strain: Not on file  Food Insecurity: Not on  file  Transportation Needs: Not on file  Physical Activity: Insufficiently Active   Days of Exercise per Week: 4 days   Minutes of Exercise per Session: 30 min  Stress: Not on file  Social Connections: Not on file    Tobacco Counseling Counseling given: Not Answered   Clinical Intake:  Pre-visit preparation completed: Yes  Pain : No/denies pain     BMI - recorded: 22.21 Nutritional Status: BMI of 19-24  Normal Nutritional Risks: None Diabetes: No  How often do you need to have someone help you when you read instructions, pamphlets, or other written materials from your doctor or pharmacy?: 1 - Never  Diabetic?No  Interpreter Needed?: No  Information entered by :: Caroleen Hamman LPN   Activities of Daily Living In your present state of health, do you have any difficulty performing the following activities: 10/13/2021 10/13/2021  Hearing? N N  Vision? N N  Difficulty concentrating or making decisions? N N  Walking or climbing stairs? N N  Dressing or bathing? N N  Doing errands, shopping? N N  Preparing Food and eating ? N N  Using the Toilet? N N  In the past six months, have you accidently leaked urine? Y Y  Do you have problems with loss of bowel control? N N  Managing your Medications? N N  Managing your Finances? N N  Housekeeping or managing your Housekeeping? N N  Some recent data might be hidden    Patient Care Team: Copland, Gay Filler, MD as PCP - General (Family Medicine) Hoy Finlay., MD (Hematology)  Indicate any recent Medical Services you may have received from other than [facility] providers in the past year (date may be approximate).     Assessment:   This is a routine wellness examination for [name].  Hearing/Vision screen Vision Screening - Comments:: Last eye exam-2021  Dietary issues and exercise activities discussed: Current Exercise Habits: Home exercise routine, Type of exercise: walking, Time (Minutes): 30, Frequency  (Times/Week): 4, Weekly Exercise (Minutes/Week): 120, Intensity: Mild, Exercise limited by: None identified   Goals Addressed               This Visit's Progress     Patient Stated     Restart tai chi (pt-stated)   Not on track     Other     Patient Stated        Work on getting a book published & work on healing techniques       Depression Screen PHQ 2/9 Scores 10/13/2021 05/05/2019 04/29/2018 04/28/2017  PHQ - 2 Score 0 0 0 0    Fall Risk Fall Risk  10/13/2021 10/13/2021 05/05/2019 04/29/2018 04/28/2017  Falls in the past year? 0 0 1 No No  Number falls in past yr: 0 0 0 - -  Injury with Fall? 0 0 0 - -  Follow up Falls prevention discussed - - - -    FALL RISK PREVENTION PERTAINING TO THE HOME:  Any stairs in or around the home? No  Home free of loose throw rugs in walkways, pet beds, electrical cords, etc? Yes  Adequate lighting in your home to reduce risk of falls? Yes   ASSISTIVE DEVICES UTILIZED TO PREVENT FALLS:  Life alert? No  Use of a cane, walker or w/c? No  Grab bars in the bathroom? Yes  Shower chair or bench in shower? No  Elevated toilet seat or a handicapped toilet? No   TIMED UP AND GO:  Was the test performed? No .  Length of time to ambulate 10 feet: 11 sec.   Gait steady and fast without use of assistive device  Cognitive Function:Normal cognitive status assessed by direct observation by this Nurse Health Advisor. No abnormalities found.          Immunizations Immunization History  Administered Date(s) Administered   Hepatitis B, ped/adol 09/30/1998, 11/04/1998, 04/07/1999   PPD Test 07/15/2016   Td 02/14/1996   Tdap 02/07/2009    TDAP status: Due, Education has been provided regarding the importance of this vaccine. Advised may receive this vaccine at local pharmacy or Health Dept. Aware to provide a copy of the vaccination record if obtained from local pharmacy or Health Dept. Verbalized acceptance and understanding.  Flu Vaccine  status: Declined, Education has been provided regarding the importance of this vaccine but patient still declined. Advised may receive this vaccine at local pharmacy or Health Dept. Aware to provide a copy of the vaccination record if obtained from local pharmacy or Health Dept. Verbalized acceptance and understanding.  Pneumococcal vaccine status: Declined,  Education has been provided regarding the importance of this vaccine but patient still declined. Advised may receive this vaccine at local pharmacy or Health Dept. Aware to provide a copy of the vaccination record if obtained from local pharmacy or Health Dept. Verbalized acceptance and understanding.   Covid-19 vaccine status: Declined, Education has been provided regarding the importance of this vaccine but patient still declined. Advised may receive this vaccine at local pharmacy or Health Dept.or vaccine clinic. Aware to provide a copy of the vaccination record if obtained from local pharmacy or Health Dept. Verbalized acceptance and understanding.  Qualifies for Shingles Vaccine? Declined  Screening Tests Health Maintenance  Topic Date Due   COVID-19 Vaccine (1) Never done   Pneumonia Vaccine 46+ Years old (1 - PCV) Never done   Zoster Vaccines- Shingrix (1 of 2) Never done   COLONOSCOPY (Pts 45-69yr Insurance coverage will need to be confirmed)  Never done   MAMMOGRAM  Never done   TETANUS/TDAP  02/08/2019   INFLUENZA VACCINE  Never done   DEXA SCAN  Completed   Hepatitis C Screening  Completed   HPV VACCINES  Aged Out    Health Maintenance  Health Maintenance Due  Topic Date Due   COVID-19 Vaccine (1) Never done   Pneumonia Vaccine 72 Years old (1 - PCV) Never done   Zoster Vaccines- Shingrix (1 of 2) Never done   COLONOSCOPY (Pts 45-414yrInsurance coverage will need to be confirmed)  Never  done   MAMMOGRAM  Never done   TETANUS/TDAP  02/08/2019   INFLUENZA VACCINE  Never done    Colorectal cancer screening:  Due-Declined  Mammogram status: Declined-patient states she has thermograms done.  Bone Density status: Declined  Lung Cancer Screening: (Low Dose CT Chest recommended if Age 43-80 years, 30 pack-year currently smoking OR have quit w/in 15years.) does not qualify.     Additional Screening:  Hepatitis C Screening: Completed 02/10/2017  Vision Screening: Recommended annual ophthalmology exams for early detection of glaucoma and other disorders of the eye. Is the patient up to date with their annual eye exam?  No  Who is the provider or what is the name of the office in which the patient attends annual eye exams? Unknown-patient advised to make an appt   Dental Screening: Recommended annual dental exams for proper oral hygiene  Community Resource Referral / Chronic Care Management: CRR required this visit?  No   CCM required this visit?  No      Plan:     I have personally reviewed and noted the following in the patient's chart:   Medical and social history Use of alcohol, tobacco or illicit drugs  Current medications and supplements including opioid prescriptions.  Functional ability and status Nutritional status Physical activity Advanced directives List of other physicians Hospitalizations, surgeries, and ER visits in previous 12 months Vitals Screenings to include cognitive, depression, and falls Referrals and appointments  In addition, I have reviewed and discussed with patient certain preventive protocols, quality metrics, and best practice recommendations. A written personalized care plan for preventive services as well as general preventive health recommendations were provided to patient.     Marta Antu, LPN   [id number]/08/[id number]  Nurse Health Advisor  Nurse Notes: None

## 2021-10-15 DIAGNOSIS — M9903 Segmental and somatic dysfunction of lumbar region: Secondary | ICD-10-CM | POA: Diagnosis not present

## 2021-10-15 DIAGNOSIS — M62830 Muscle spasm of back: Secondary | ICD-10-CM | POA: Diagnosis not present

## 2021-10-15 DIAGNOSIS — M5413 Radiculopathy, cervicothoracic region: Secondary | ICD-10-CM | POA: Diagnosis not present

## 2021-10-15 DIAGNOSIS — M5417 Radiculopathy, lumbosacral region: Secondary | ICD-10-CM | POA: Diagnosis not present

## 2021-10-15 DIAGNOSIS — M9902 Segmental and somatic dysfunction of thoracic region: Secondary | ICD-10-CM | POA: Diagnosis not present

## 2021-10-15 DIAGNOSIS — M9901 Segmental and somatic dysfunction of cervical region: Secondary | ICD-10-CM | POA: Diagnosis not present

## 2021-10-27 DIAGNOSIS — M9902 Segmental and somatic dysfunction of thoracic region: Secondary | ICD-10-CM | POA: Diagnosis not present

## 2021-10-27 DIAGNOSIS — M5417 Radiculopathy, lumbosacral region: Secondary | ICD-10-CM | POA: Diagnosis not present

## 2021-10-27 DIAGNOSIS — M62830 Muscle spasm of back: Secondary | ICD-10-CM | POA: Diagnosis not present

## 2021-10-27 DIAGNOSIS — M9901 Segmental and somatic dysfunction of cervical region: Secondary | ICD-10-CM | POA: Diagnosis not present

## 2021-10-27 DIAGNOSIS — M5413 Radiculopathy, cervicothoracic region: Secondary | ICD-10-CM | POA: Diagnosis not present

## 2021-10-27 DIAGNOSIS — M9903 Segmental and somatic dysfunction of lumbar region: Secondary | ICD-10-CM | POA: Diagnosis not present

## 2021-11-06 DIAGNOSIS — M5417 Radiculopathy, lumbosacral region: Secondary | ICD-10-CM | POA: Diagnosis not present

## 2021-11-06 DIAGNOSIS — M5413 Radiculopathy, cervicothoracic region: Secondary | ICD-10-CM | POA: Diagnosis not present

## 2021-11-06 DIAGNOSIS — M9901 Segmental and somatic dysfunction of cervical region: Secondary | ICD-10-CM | POA: Diagnosis not present

## 2021-11-06 DIAGNOSIS — M9902 Segmental and somatic dysfunction of thoracic region: Secondary | ICD-10-CM | POA: Diagnosis not present

## 2021-11-06 DIAGNOSIS — M62830 Muscle spasm of back: Secondary | ICD-10-CM | POA: Diagnosis not present

## 2021-11-06 DIAGNOSIS — M9903 Segmental and somatic dysfunction of lumbar region: Secondary | ICD-10-CM | POA: Diagnosis not present

## 2021-11-14 DIAGNOSIS — M5417 Radiculopathy, lumbosacral region: Secondary | ICD-10-CM | POA: Diagnosis not present

## 2021-11-14 DIAGNOSIS — M9901 Segmental and somatic dysfunction of cervical region: Secondary | ICD-10-CM | POA: Diagnosis not present

## 2021-11-14 DIAGNOSIS — M62830 Muscle spasm of back: Secondary | ICD-10-CM | POA: Diagnosis not present

## 2021-11-14 DIAGNOSIS — M9903 Segmental and somatic dysfunction of lumbar region: Secondary | ICD-10-CM | POA: Diagnosis not present

## 2021-11-14 DIAGNOSIS — M5413 Radiculopathy, cervicothoracic region: Secondary | ICD-10-CM | POA: Diagnosis not present

## 2021-11-14 DIAGNOSIS — M9902 Segmental and somatic dysfunction of thoracic region: Secondary | ICD-10-CM | POA: Diagnosis not present

## 2021-11-19 DIAGNOSIS — Z1212 Encounter for screening for malignant neoplasm of rectum: Secondary | ICD-10-CM | POA: Diagnosis not present

## 2021-11-21 DIAGNOSIS — M5417 Radiculopathy, lumbosacral region: Secondary | ICD-10-CM | POA: Diagnosis not present

## 2021-11-21 DIAGNOSIS — M5413 Radiculopathy, cervicothoracic region: Secondary | ICD-10-CM | POA: Diagnosis not present

## 2021-11-21 DIAGNOSIS — M9902 Segmental and somatic dysfunction of thoracic region: Secondary | ICD-10-CM | POA: Diagnosis not present

## 2021-11-21 DIAGNOSIS — M62830 Muscle spasm of back: Secondary | ICD-10-CM | POA: Diagnosis not present

## 2021-11-21 DIAGNOSIS — M9901 Segmental and somatic dysfunction of cervical region: Secondary | ICD-10-CM | POA: Diagnosis not present

## 2021-11-21 DIAGNOSIS — M9903 Segmental and somatic dysfunction of lumbar region: Secondary | ICD-10-CM | POA: Diagnosis not present

## 2022-01-03 ENCOUNTER — Emergency Department (HOSPITAL_BASED_OUTPATIENT_CLINIC_OR_DEPARTMENT_OTHER): Payer: Medicare PPO

## 2022-01-03 ENCOUNTER — Encounter (HOSPITAL_BASED_OUTPATIENT_CLINIC_OR_DEPARTMENT_OTHER): Payer: Self-pay | Admitting: Emergency Medicine

## 2022-01-03 ENCOUNTER — Other Ambulatory Visit: Payer: Self-pay

## 2022-01-03 ENCOUNTER — Emergency Department (HOSPITAL_BASED_OUTPATIENT_CLINIC_OR_DEPARTMENT_OTHER)
Admission: EM | Admit: 2022-01-03 | Discharge: 2022-01-03 | Disposition: A | Discharge status: Home / Self Care | Payer: Medicare PPO | Attending: Emergency Medicine | Admitting: Emergency Medicine

## 2022-01-03 DIAGNOSIS — S0083XA Contusion of other part of head, initial encounter: Secondary | ICD-10-CM | POA: Insufficient documentation

## 2022-01-03 DIAGNOSIS — Y93E2 Activity, laundry: Secondary | ICD-10-CM | POA: Diagnosis not present

## 2022-01-03 DIAGNOSIS — S0990XA Unspecified injury of head, initial encounter: Secondary | ICD-10-CM | POA: Diagnosis present

## 2022-01-03 DIAGNOSIS — W268XXA Contact with other sharp object(s), not elsewhere classified, initial encounter: Secondary | ICD-10-CM | POA: Diagnosis not present

## 2022-01-03 NOTE — ED Provider Notes (Signed)
[facility] Provider Note: Lowella Dell, MD, FACEP  CSN: [id number] MRN: [id number] ARRIVAL: 01/03/22 at 0252 ROOM: [facility]12/[facility]12   CHIEF COMPLAINT  Head Injury   HISTORY OF PRESENT ILLNESS  01/03/22 3:46 AM Meredith Dominguez is a 71 y.o. female with von Willebrand's disease.  She was doing laundry about 1:30 AM and the lid fell on her forehead.  She has a hematoma to her left forehead.  She has some associated stinging which she rates as a 1 out of 10.  She did not lose consciousness.  She has not been vomiting.  She denies neck pain.   Past Medical History:  Diagnosis Date   Anemia    Osteoporosis 05/15/2019   Pharyngitis 05/25/2017   Von Willebrand disease     Past Surgical History:  Procedure Laterality Date   BREAST SURGERY     Breast Biopsy Left x2    Family History  Problem Relation Age of Onset   Leukemia Cousin    Cancer Mother    Diabetes Father    Heart disease Father    Bleeding Disorder Neg Hx     Social History   Tobacco Use   Smoking status: Never   Smokeless tobacco: Never  Vaping Use   Vaping Use: Never used  Substance Use Topics   Alcohol use: Yes    Comment: wine once per week   Drug use: No    Prior to Admission medications   Medication Sig Start Date End Date Taking? Authorizing Provider  Cholecalciferol (VITAMIN D3) 100000 UNIT/GM POWD 2 drops per day    [provider]  Cholecalciferol 1.25 MG (50000 UT) TABS 2 drops per day    [provider]  magnesium 30 MG tablet Take 30 mg by mouth 2 (two) times daily.    [provider]  vitamin B-12 (CYANOCOBALAMIN) 500 MCG tablet Take 500 mcg by mouth daily.    [provider]    Allergies Gluten meal, Monosodium glutamate, Other, Sesame oil, Tomato, Wheat bran, and Sulfa antibiotics   REVIEW OF SYSTEMS  Negative except as noted here or in the History of Present Illness.   PHYSICAL EXAMINATION  Initial Vital Signs Blood pressure (!) 151/84,  pulse 79, temperature (!) 97.5 F (36.4 C), temperature source Oral, resp. rate 18, height 5\' 7"  (1.702 m), weight 62.1 kg, SpO2 100 %.  Examination General: Well-developed, well-nourished female in no acute distress; appearance consistent with age of record HENT: normocephalic; left forehead hematoma:    Eyes: pupils equal, round and reactive to light; extraocular muscles intact Neck: supple; nontender Heart: regular rate and rhythm Lungs: clear to auscultation bilaterally Abdomen: soft; nondistended; nontender; bowel sounds present Extremities: No deformity; full range of motion Neurologic: Awake, alert and oriented; motor function intact in all extremities and symmetric; no facial droop Skin: Warm and dry Psychiatric: Normal mood and affect   RESULTS  Summary of this visit's results, reviewed and interpreted by myself:   EKG Interpretation  Date/Time:    Ventricular Rate:    PR Interval:    QRS Duration:   QT Interval:    QTC Calculation:   R Axis:     Text Interpretation:         Laboratory Studies: No results found for this or any previous visit (from the past 24 hour(s)). Imaging Studies: CT Head Wo Contrast  Result Date: 01/03/2022 CLINICAL DATA:  Head trauma with head and neck pain. EXAM: CT HEAD WITHOUT CONTRAST CT CERVICAL SPINE  WITHOUT CONTRAST TECHNIQUE: Multidetector CT imaging of the head and cervical spine was performed following the standard protocol without intravenous contrast. Multiplanar CT image reconstructions of the cervical spine were also generated. RADIATION DOSE REDUCTION: This exam was performed according to the departmental dose-optimization program which includes automated exposure control, adjustment of the mA and/or kV according to patient size and/or use of iterative reconstruction technique. COMPARISON:  Soft tissue neck CT, including the bone window images, 12/23/2016. no prior head CT. FINDINGS: CT HEAD FINDINGS Brain: No evidence of acute  infarction, hemorrhage, hydrocephalus, acute extra-axial collection or mass lesion/mass effect. Again noted is a posterior fossa arachnoid cyst measuring 6.8 cm in length by 4.8 x 3.2 cm eccentric slightly to the right the mostly in the midline posteriorly. There is no associated downward mass effect with again noted secondary deformity of the medial cerebellar folia. There is mild-to-moderate atrophy and atrophic ventriculomegaly, mild small-vessel disease of the cerebral white matter, mild cerebellar atrophy. There is dystrophic calcification in the falx near the vertex. Vascular: There are scattered calcifications in the carotid siphons, distal left vertebral artery. There are no hyperdense central vessels. Skull: The calvarium skull base and orbits are intact. Prominent arachnoid granulations are again noted in the occipital bone in the midline and right of midline, unchanged. No suspicious bone lesion. There is mild swelling in the left frontal scalp. Sinuses/Orbits: There is mild scattered membrane thickening in the ethmoids. Other sinuses, bilateral mastoid air cells are clear. Other: None. CT CERVICAL SPINE FINDINGS Alignment: Normal. Skull base and vertebrae: No acute fracture. No primary bone lesion or focal pathologic process. Bone mineralization is mildly osteopenic. Joint space loss and spurring of the anterior atlantodental joint are again shown. There is joint space loss and remodeling in both TMJs, which was seen previously. Soft tissues and spinal canal: No prevertebral fluid or swelling. No visible canal hematoma. There are small calcifications at the right carotid bifurcation. Right palatine tonsillar enlargement noted previously is no longer seen. Disc levels: There is moderate disc space loss C4-5, C5-6 and C6-7, at C5-6 and C6-7 with bidirectional osteophytes, posterior disc osteophyte complexes mildly encroaching on the left ventral cord surface at these 2 levels. Other discs are normal in  heights. There is no high-grade spinal canal stenosis. Due to uncinate joint and facet spurring there is mild/moderate foraminal stenosis at C5-6 and C6-7. Upper chest: Negative except for a calcified granuloma in the left lung apex. Other: None. IMPRESSION: 1. Left forehead scalp swelling with no acute intracranial CT findings or depressed skull fractures. Stable occipital bone arachnoid granulations in the midline. 2. Stable posterior fossa arachnoid cyst.  No downward mass effect. 3. Osteopenia and degenerative change without evidence of cervical fracture or malalignment. Electronically Signed   By: Almira Bar M.D.   On: 01/03/2022 04:56   CT Cervical Spine Wo Contrast  Result Date: 01/03/2022 CLINICAL DATA:  Head trauma with head and neck pain. EXAM: CT HEAD WITHOUT CONTRAST CT CERVICAL SPINE WITHOUT CONTRAST TECHNIQUE: Multidetector CT imaging of the head and cervical spine was performed following the standard protocol without intravenous contrast. Multiplanar CT image reconstructions of the cervical spine were also generated. RADIATION DOSE REDUCTION: This exam was performed according to the departmental dose-optimization program which includes automated exposure control, adjustment of the mA and/or kV according to patient size and/or use of iterative reconstruction technique. COMPARISON:  Soft tissue neck CT, including the bone window images, 12/23/2016. no prior head CT. FINDINGS: CT HEAD FINDINGS Brain: No evidence  of acute infarction, hemorrhage, hydrocephalus, acute extra-axial collection or mass lesion/mass effect. Again noted is a posterior fossa arachnoid cyst measuring 6.8 cm in length by 4.8 x 3.2 cm eccentric slightly to the right the mostly in the midline posteriorly. There is no associated downward mass effect with again noted secondary deformity of the medial cerebellar folia. There is mild-to-moderate atrophy and atrophic ventriculomegaly, mild small-vessel disease of the cerebral white  matter, mild cerebellar atrophy. There is dystrophic calcification in the falx near the vertex. Vascular: There are scattered calcifications in the carotid siphons, distal left vertebral artery. There are no hyperdense central vessels. Skull: The calvarium skull base and orbits are intact. Prominent arachnoid granulations are again noted in the occipital bone in the midline and right of midline, unchanged. No suspicious bone lesion. There is mild swelling in the left frontal scalp. Sinuses/Orbits: There is mild scattered membrane thickening in the ethmoids. Other sinuses, bilateral mastoid air cells are clear. Other: None. CT CERVICAL SPINE FINDINGS Alignment: Normal. Skull base and vertebrae: No acute fracture. No primary bone lesion or focal pathologic process. Bone mineralization is mildly osteopenic. Joint space loss and spurring of the anterior atlantodental joint are again shown. There is joint space loss and remodeling in both TMJs, which was seen previously. Soft tissues and spinal canal: No prevertebral fluid or swelling. No visible canal hematoma. There are small calcifications at the right carotid bifurcation. Right palatine tonsillar enlargement noted previously is no longer seen. Disc levels: There is moderate disc space loss C4-5, C5-6 and C6-7, at C5-6 and C6-7 with bidirectional osteophytes, posterior disc osteophyte complexes mildly encroaching on the left ventral cord surface at these 2 levels. Other discs are normal in heights. There is no high-grade spinal canal stenosis. Due to uncinate joint and facet spurring there is mild/moderate foraminal stenosis at C5-6 and C6-7. Upper chest: Negative except for a calcified granuloma in the left lung apex. Other: None. IMPRESSION: 1. Left forehead scalp swelling with no acute intracranial CT findings or depressed skull fractures. Stable occipital bone arachnoid granulations in the midline. 2. Stable posterior fossa arachnoid cyst.  No downward mass  effect. 3. Osteopenia and degenerative change without evidence of cervical fracture or malalignment. Electronically Signed   By: Almira Bar M.D.   On: 01/03/2022 04:56    ED COURSE and MDM  Nursing notes, initial and subsequent vitals signs, including pulse oximetry, reviewed and interpreted by myself.  Vitals:   01/03/22 0302 01/03/22 0309  BP:  (!) 151/84  Pulse:  79  Resp:  18  Temp:  (!) 97.5 F (36.4 C)  TempSrc:  Oral  SpO2:  100%  Weight: 62.1 kg   Height: 5\' 7"  (1.702 m)    Medications - No data to display  Although the patient is not on any known anticoagulants and her trauma was relatively minor we will obtain a head CT given that she has von Willebrand's disease and a history of poor clotting in the past.  5:00 AM Patient given instructions for signs or symptoms that would warrant return.  She was advised that the hematoma of her fourth head will likely drift downward and give her 1 or 2 periorbital hematomas ("black eyes").  PROCEDURES  Procedures   ED DIAGNOSES     ICD-10-CM   1. Traumatic hematoma of forehead, initial encounter           [id number], MD 01/03/22 [id number]

## 2022-01-03 NOTE — ED Triage Notes (Signed)
Pt states she was doing laundry and the washing machine lid fell and hit her in the head  Pt has a hematoma noted on her forehead  Denies LOC  Pt states this occurred about an hour ago

## 2022-03-14 NOTE — Progress Notes (Addendum)
[facility] conservation officer at [employer] ?[address], [address] ?[place], [place] [address] ?336 [phone] ?Fax [phone] ? ?Date:  03/18/2022  ? ?Name:  Meredith Dominguez   DOB:  1951/07/30   MRN:  [id number] ? ?PCP:  Pearline Cables, MD  ? ? ?Chief Complaint: yearly office visit (Concerns/ questions: 1. Every once in a while she has pressure in her L ear and some muscle spasm, she wonders if this could be ptsd from her past relationship. ) ? ? ?History of Present Illness: ? ?Meredith Dominguez is a 71 y.o. very pleasant female patient who presents with the following: ? ?Patient seen today for physical exam/annual visit- history of Von Willebrand disease and thrombocytopenia, osteoporosis ?Most recently seen by myself in February 2021; Meredith Dominguez tends to have some unusual health believes, for example at our last visit she endorsed a "tapeworm" which was diagnosed by her "alternative doctor" ? ?She notes an occasional strange feeling in her left ear if she has used her hearing aids for a long time, or if she exits say a bar and is in the parking lot ?She really has a hard time quantifying how often this might happen.  Not present now.  She thinks it might be due to history of past trauma  ? ?Colon cancer screening- pt declines at this time  ?Mammogram- pt declines  ?Can offer routine immunizations- she declines  ?Can offer blood work ?Pap done in 2021- negative  ? ?Walking for exercise ?Never a smoker  ?Patient Active Problem List  ? Diagnosis Date Noted  ? Osteoporosis 05/15/2019  ? Pharyngitis 05/25/2017  ? Von Willebrand disease, type IIa (HCC) 07/29/2016  ? Acute blood loss anemia 07/29/2016  ? Thrombocytopenia (HCC) 07/29/2016  ? Bleeding gums   ? ? ?Past Medical History:  ?Diagnosis Date  ? Anemia   ? Osteoporosis 05/15/2019  ? Pharyngitis 05/25/2017  ? Von Willebrand disease   ? ? ?Past Surgical History:  ?Procedure Laterality Date  ? BREAST SURGERY    ? Breast Biopsy Left x2  ? ? ?Social History  ? ?Tobacco  Use  ? Smoking status: Never  ? Smokeless tobacco: Never  ?Vaping Use  ? Vaping Use: Never used  ?Substance Use Topics  ? Alcohol use: Yes  ?  Comment: wine once per week  ? Drug use: No  ? ? ?Family History  ?Problem Relation Age of Onset  ? Leukemia Cousin   ? Cancer Mother   ? Diabetes Father   ? Heart disease Father   ? Bleeding Disorder Neg Hx   ? ? ?Allergies  ?Allergen Reactions  ? Gluten Meal Other (See Comments)  ?  sensitivity  ? Monosodium Glutamate Other (See Comments)  ?  Hot and feels bad  ? Other Other (See Comments)  ?  Red places  ? Sesame Oil Rash  ?  Turns bright, ear and anus open and close.  ? Tomato Other (See Comments)  ?  Other night shade vegetables like eggplant, potato  ? Wheat Bran Other (See Comments)  ?  Stomach issues  ? Sulfa Antibiotics Rash  ? ? ?Medication list has been reviewed and updated. ? ?Current Outpatient Medications on File Prior to Visit  ?Medication Sig Dispense Refill  ? magnesium 30 MG tablet Take 30 mg by mouth 2 (two) times daily.    ? vitamin B-12 (CYANOCOBALAMIN) 500 MCG tablet Take 500 mcg by mouth daily.    ? ?No current facility-administered medications on  file prior to visit.  ? ? ?Review of Systems: ? ?As per HPI- otherwise negative. ? ? ?Physical Examination: ?Vitals:  ? 03/18/22 1013  ?BP: 112/70  ?Pulse: 75  ?Resp: 18  ?Temp: 97.6 ?F (36.4 ?C)  ?SpO2: 98%  ? ?Vitals:  ? 03/18/22 1013  ?Weight: 139 lb (63 kg)  ?Height: 5' 6.5" (1.689 m)  ? ?Body mass index is 22.1 kg/m?. ?Ideal Body Weight: Weight in (lb) to have BMI = 25: 156.9 ? ?GEN: no acute distress. Normal weight, looks well  ?HEENT: Atraumatic, Normocephalic.  ?Ears and Nose: No external deformity. ?CV: RRR, No M/G/R. No JVD. No thrill. No extra heart sounds. ?PULM: CTA B, no wheezes, crackles, rhonchi. No retractions. No resp. distress. No accessory muscle use. ?ABD: S, NT, ND, +BS. No rebound. No HSM. ?EXTR: No c/c/e ?PSYCH: Normally interactive. Conversant.  ?Normal breast exam today  ?Assessment  and Plan: ?Physical exam ? ?Screening for deficiency anemia - Plan: CBC ? ?Screening for diabetes mellitus - Plan: Comprehensive metabolic panel, Hemoglobin A1c ? ?Screening for hyperlipidemia - Plan: Lipid panel ? ?Screening for thyroid disorder - Plan: TSH ? ?Seen today for physical exam.  Encouraged healthy diet and exercise routine ?Labs are pending as above ?Offered and encouraged routine health screenings such as mammogram and colonoscopy, for now she declines.  Also declines immunizations ? ?Signed ?Abbe Amsterdam, MD ?Received labs, message to pt ? ?Results for orders placed or performed in visit on 03/18/22  ?CBC  ?Result Value Ref Range  ? WBC 8.7 4.0 - 10.5 K/uL  ? RBC 4.46 3.87 - 5.11 Mil/uL  ? Platelets 72.0 (L) 150.0 - 400.0 K/uL  ? Hemoglobin 13.1 12.0 - 15.0 g/dL  ? HCT 39.8 36.0 - 46.0 %  ? MCV 89.3 78.0 - 100.0 fl  ? MCHC 32.9 30.0 - 36.0 g/dL  ? RDW 15.0 11.5 - 15.5 %  ?Comprehensive metabolic panel  ?Result Value Ref Range  ? Sodium 137 135 - 145 mEq/L  ? Potassium 4.3 3.5 - 5.1 mEq/L  ? Chloride 104 96 - 112 mEq/L  ? CO2 27 19 - 32 mEq/L  ? Glucose, Bld 71 70 - 99 mg/dL  ? BUN 21 6 - 23 mg/dL  ? Creatinine, Ser 0.92 0.40 - 1.20 mg/dL  ? Total Bilirubin 0.7 0.2 - 1.2 mg/dL  ? Alkaline Phosphatase 73 39 - 117 U/L  ? AST 16 0 - 37 U/L  ? ALT 10 0 - 35 U/L  ? Total Protein 6.8 6.0 - 8.3 g/dL  ? Albumin 4.3 3.5 - 5.2 g/dL  ? GFR 61.93 >60.00 mL/min  ? Calcium 8.8 8.4 - 10.5 mg/dL  ?Hemoglobin A1c  ?Result Value Ref Range  ? Hgb A1c MFr Bld 5.1 4.6 - 6.5 %  ?Lipid panel  ?Result Value Ref Range  ? Cholesterol 245 (H) 0 - 200 mg/dL  ? Triglycerides 154.0 (H) 0.0 - 149.0 mg/dL  ? HDL 68.20 >39.00 mg/dL  ? VLDL 30.8 0.0 - 40.0 mg/dL  ? LDL Cholesterol 146 (H) 0 - 99 mg/dL  ? Total CHOL/HDL Ratio 4   ? NonHDL 177.24   ?TSH  ?Result Value Ref Range  ? TSH 2.87 0.35 - 5.50 uIU/mL  ? ? ? ?

## 2022-03-18 ENCOUNTER — Encounter: Payer: Medicare PPO | Admitting: Family Medicine

## 2022-03-18 ENCOUNTER — Encounter: Payer: Self-pay | Admitting: Family Medicine

## 2022-03-18 ENCOUNTER — Ambulatory Visit (INDEPENDENT_AMBULATORY_CARE_PROVIDER_SITE_OTHER): Payer: PPO | Admitting: Family Medicine

## 2022-03-18 VITALS — BP 112/70 | HR 75 | Temp 97.6°F | Resp 18 | Ht 66.5 in | Wt 139.0 lb

## 2022-03-18 DIAGNOSIS — Z0000 Encounter for general adult medical examination without abnormal findings: Secondary | ICD-10-CM | POA: Diagnosis not present

## 2022-03-18 DIAGNOSIS — Z131 Encounter for screening for diabetes mellitus: Secondary | ICD-10-CM | POA: Diagnosis not present

## 2022-03-18 DIAGNOSIS — Z130 Encounter for screening for diseases of the blood and blood-forming organs and certain disorders involving the immune mechanism: Secondary | ICD-10-CM

## 2022-03-18 DIAGNOSIS — Z13220 Encounter for screening for lipoid disorders: Secondary | ICD-10-CM | POA: Diagnosis not present

## 2022-03-18 DIAGNOSIS — Z1329 Encounter for screening for other suspected endocrine disorder: Secondary | ICD-10-CM | POA: Diagnosis not present

## 2022-03-18 LAB — LIPID PANEL
Cholesterol: 245 mg/dL — ABNORMAL HIGH (ref 0–200)
HDL: 68.2 mg/dL (ref >39.00)
LDL Cholesterol: 146 mg/dL — ABNORMAL HIGH (ref 0–99)
NonHDL: 177.24
Total CHOL/HDL Ratio: 4
Triglycerides: 154 mg/dL — ABNORMAL HIGH (ref 0.0–149.0)
VLDL: 30.8 mg/dL (ref 0.0–40.0)

## 2022-03-18 LAB — COMPREHENSIVE METABOLIC PANEL
ALT: 10 U/L (ref 0–35)
AST: 16 U/L (ref 0–37)
Albumin: 4.3 g/dL (ref 3.5–5.2)
Alkaline Phosphatase: 73 U/L (ref 39–117)
BUN: 21 mg/dL (ref 6–23)
CO2: 27 mEq/L (ref 19–32)
Calcium: 8.8 mg/dL (ref 8.4–10.5)
Chloride: 104 mEq/L (ref 96–112)
Creatinine, Ser: 0.92 mg/dL (ref 0.40–1.20)
GFR: 61.93 mL/min (ref >60.00)
Glucose, Bld: 71 mg/dL (ref 70–99)
Potassium: 4.3 mEq/L (ref 3.5–5.1)
Sodium: 137 mEq/L (ref 135–145)
Total Bilirubin: 0.7 mg/dL (ref 0.2–1.2)
Total Protein: 6.8 g/dL (ref 6.0–8.3)

## 2022-03-18 LAB — CBC
HCT: 39.8 % (ref 36.0–46.0)
Hemoglobin: 13.1 g/dL (ref 12.0–15.0)
MCHC: 32.9 g/dL (ref 30.0–36.0)
MCV: 89.3 fl (ref 78.0–100.0)
Platelets: 72 10*3/uL — ABNORMAL LOW (ref 150.0–400.0)
RBC: 4.46 Mil/uL (ref 3.87–5.11)
RDW: 15 % (ref 11.5–15.5)
WBC: 8.7 10*3/uL (ref 4.0–10.5)

## 2022-03-18 LAB — HEMOGLOBIN A1C: Hgb A1c MFr Bld: 5.1 % (ref 4.6–6.5)

## 2022-03-18 LAB — TSH: TSH: 2.87 u[IU]/mL (ref 0.35–5.50)

## 2022-03-18 NOTE — Patient Instructions (Signed)
Good to see you today ?I will be in touch with your labs ?I continue to recommend mammogram and routine immunizations ?We can do a Colguard test to screen for colon cancer at home- however this is just a screening test,  if you have a positive result we would need to be prepared to do a colonoscopy.  Please let me know if you would like to pursue the cologuard  ?

## 2022-04-08 IMAGING — CT CT CERVICAL SPINE W/O CM
3 of 4 series · 10 of 33 positions shown, 12 images · non-contrast
Comparison: Soft tissue neck CT, including the bone window images,
12/23/2016. no prior head CT.

CLINICAL DATA: Head trauma with head and neck pain.



[Series 5: coronals · coronal · 0.23mm/px · 3 of 68 slices shown]
[im 16/68  bone]
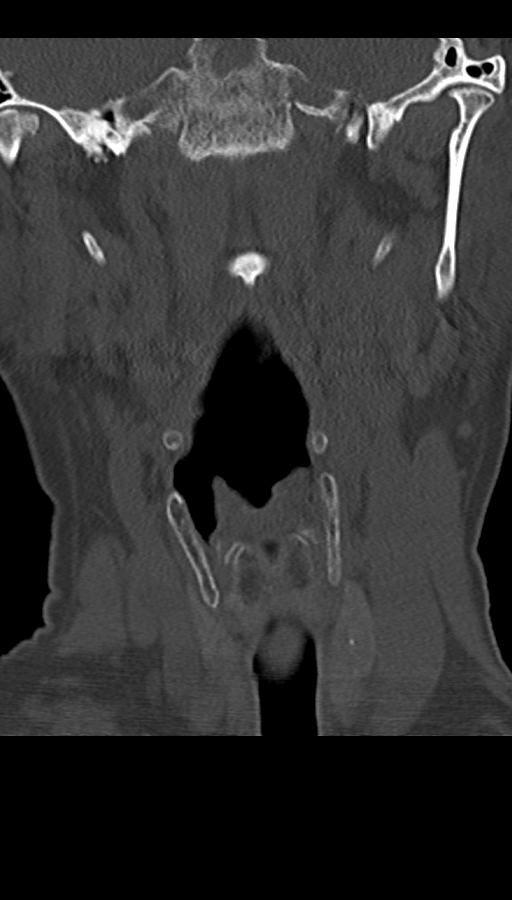
[im 28/68  bone]
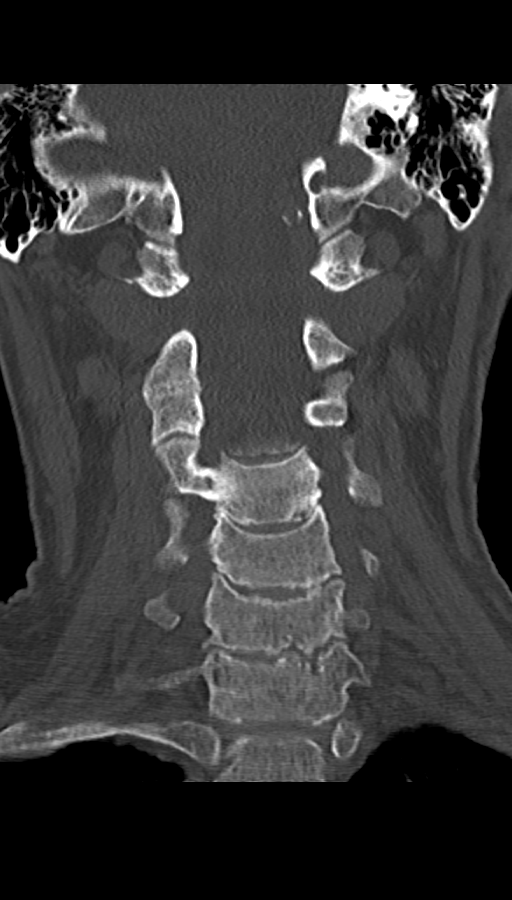
[im 40/68  bone]
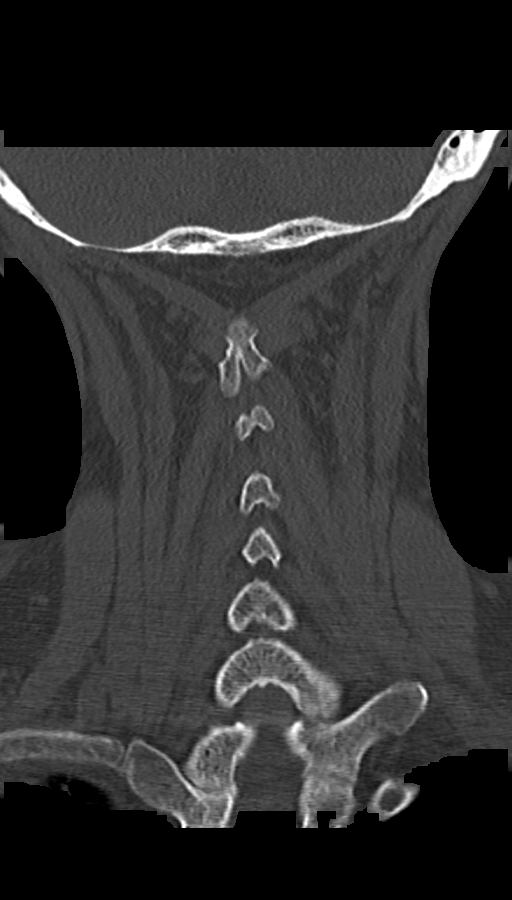

[Series 6: sagittals · sagittal · 0.28mm/px · 5 of 61 slices shown, 6 images]
[im 21/61  bone]
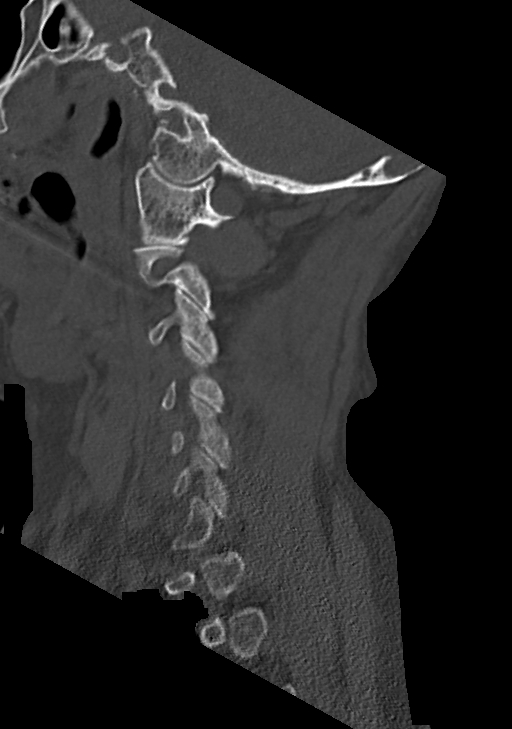
[im 26/61  bone]
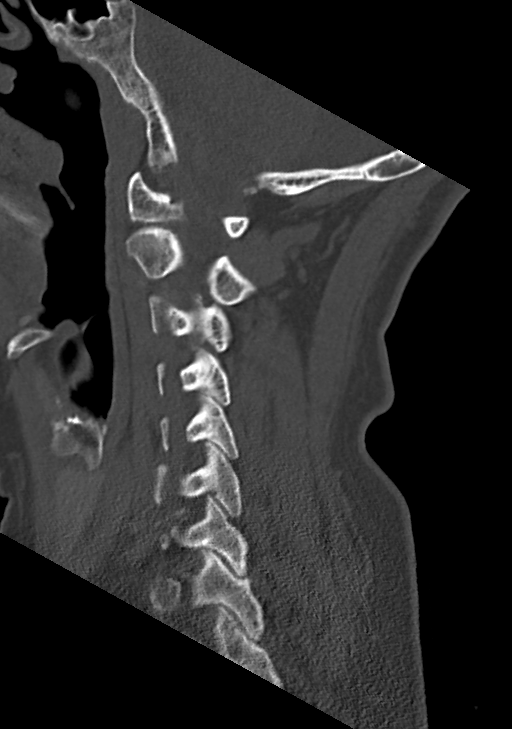
[im 31/61  soft-tissue]
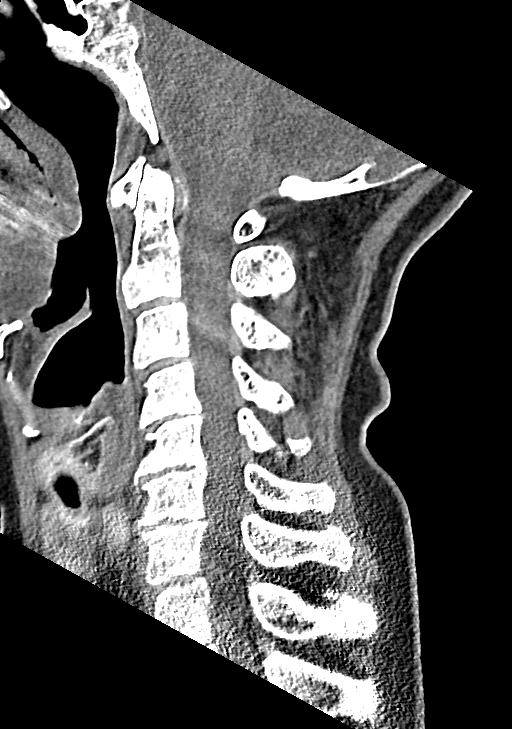
[im 31/61  bone]
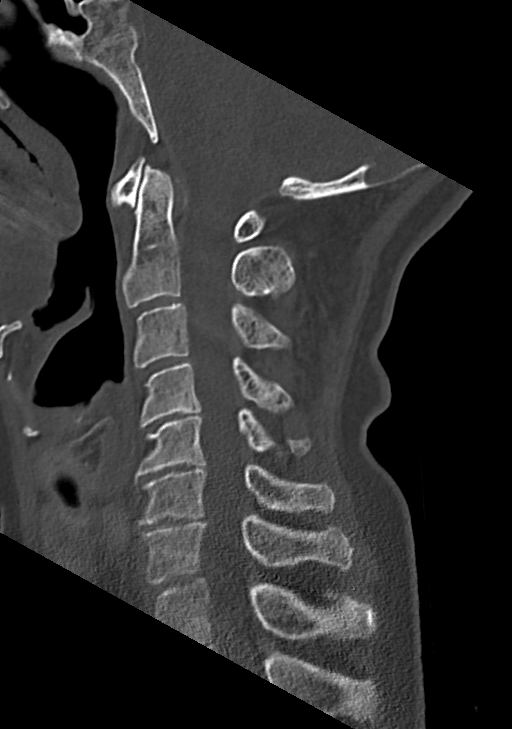
[im 36/61  bone]
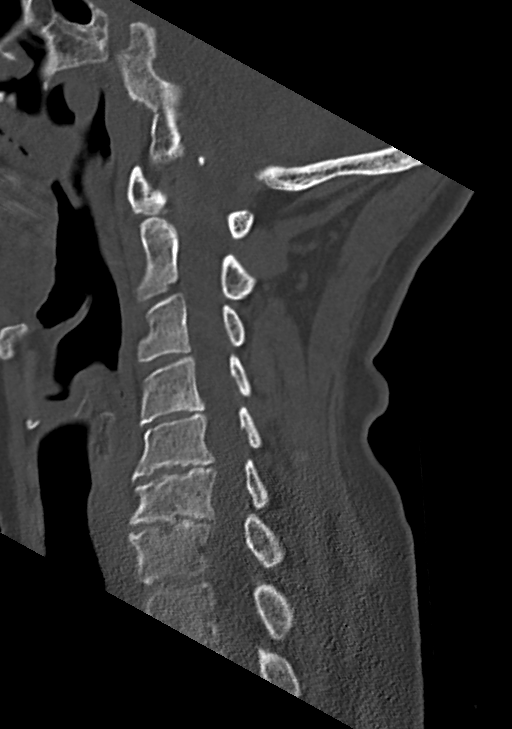
[im 41/61  bone]
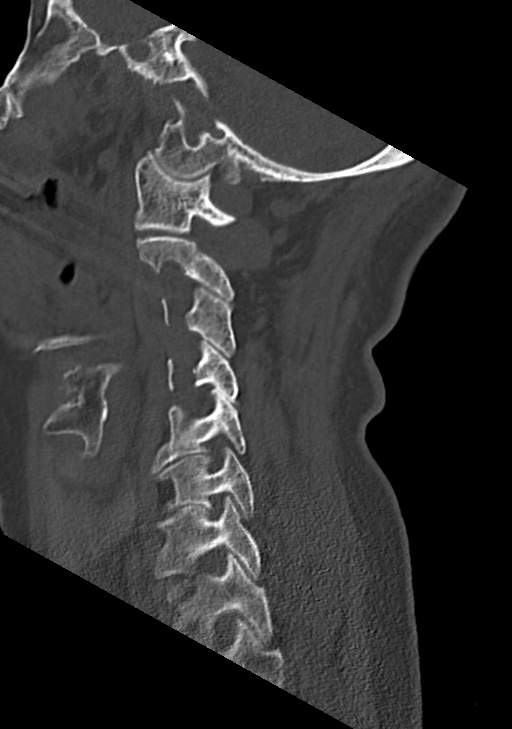

[Series 7: orthogonals · axial · 0.23mm/px · z∈[-292,-215]mm · 2 of 106 slices shown, 3 images]
[im 31/106  soft-tissue]
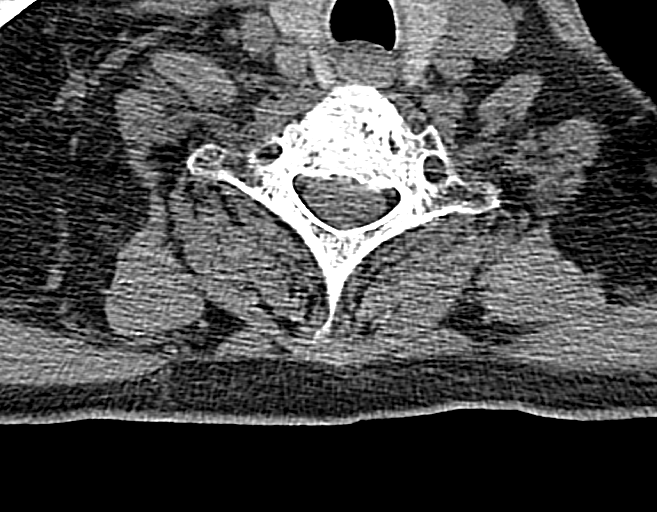
[im 31/106  bone]
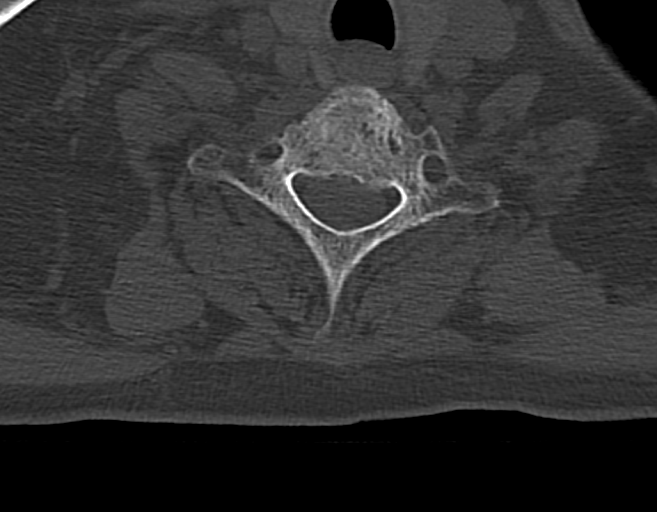
[im 76/106  bone]
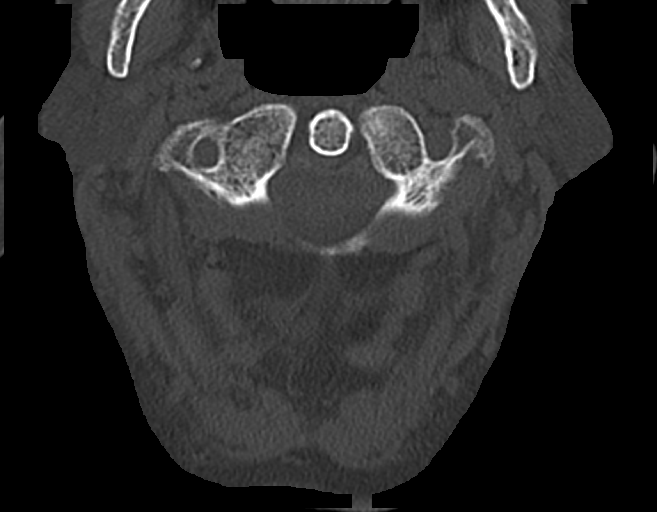

[10 of 33 positions shown; findings below may reference images not displayed]

FINDINGS: CT HEAD FINDINGS

Brain: No evidence of acute infarction, hemorrhage, hydrocephalus,
acute extra-axial collection or mass lesion/mass effect. Again noted
is a posterior fossa arachnoid cyst measuring 6.8 cm in length by
4.8 x 3.2 cm eccentric slightly to the right the mostly in the
midline posteriorly.

There is no associated downward mass effect with again noted
secondary deformity of the medial cerebellar folia.

There is mild-to-moderate atrophy and atrophic ventriculomegaly,
mild small-vessel disease of the cerebral white matter, mild
cerebellar atrophy. There is dystrophic calcification in the falx
near the vertex.

Vascular: There are scattered calcifications in the carotid siphons,
distal left vertebral artery. There are no hyperdense central
vessels.

Skull: The calvarium skull base and orbits are intact. Prominent
arachnoid granulations are again noted in the occipital bone in the
midline and right of midline, unchanged. No suspicious bone lesion.
There is mild swelling in the left frontal scalp.

Sinuses/Orbits: There is mild scattered membrane thickening in the
ethmoids. Other sinuses, bilateral mastoid air cells are clear.

Other: None.

CT CERVICAL SPINE FINDINGS

Alignment: Normal.

Skull base and vertebrae: No acute fracture. No primary bone lesion
or focal pathologic process. Bone mineralization is mildly
osteopenic. Joint space loss and spurring of the anterior
atlantodental joint are again shown.

There is joint space loss and remodeling in both TMJs, which was
seen previously.

Soft tissues and spinal canal: No prevertebral fluid or swelling. No
visible canal hematoma. There are small calcifications at the right
carotid bifurcation. Right palatine tonsillar enlargement noted
previously is no longer seen.

Disc levels: There is moderate disc space loss C4-5, C5-6 and C6-7,
at C5-6 and C6-7 with bidirectional osteophytes, posterior disc
osteophyte complexes mildly encroaching on the left ventral cord
surface at these 2 levels.

Other discs are normal in heights. There is no high-grade spinal
canal stenosis.

Due to uncinate joint and facet spurring there is mild/moderate
foraminal stenosis at C5-6 and C6-7.

Upper chest: Negative except for a calcified granuloma in the left
lung apex.

Other: None.
IMPRESSION: 1. Left forehead scalp swelling with no acute intracranial CT
findings or depressed skull fractures. Stable occipital bone
arachnoid granulations in the midline.
2. Stable posterior fossa arachnoid cyst.  No downward mass effect.
3. Osteopenia and degenerative change without evidence of cervical
fracture or malalignment.

## 2022-04-08 IMAGING — CT CT HEAD W/O CM
3 series · 14 of 47 positions shown, 16 images · non-contrast
Comparison: Soft tissue neck CT, including the bone window images,
12/23/2016. no prior head CT.

CLINICAL DATA: Head trauma with head and neck pain.



[Series 2: head 5.0 h30s · axial · 0.47mm/px · z∈[-181,-31]mm · 8 of 36 slices shown, 10 images]
[im 3/36  brain]
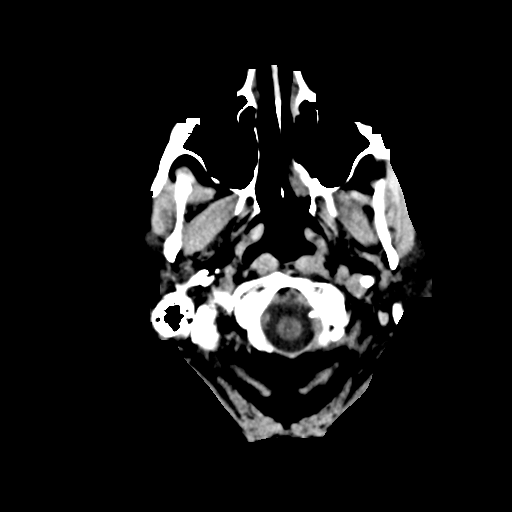
[im 3/36  bone]
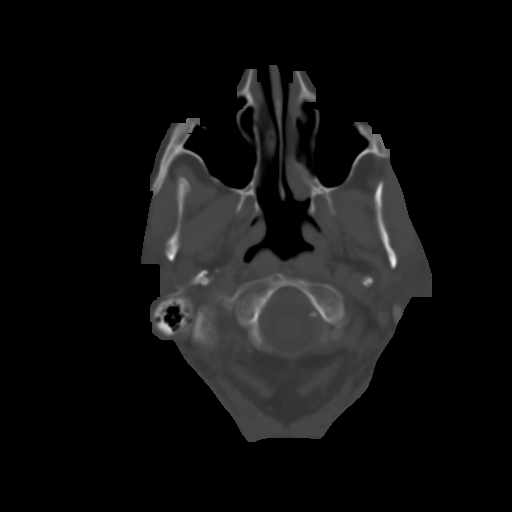
[im 8/36  brain]
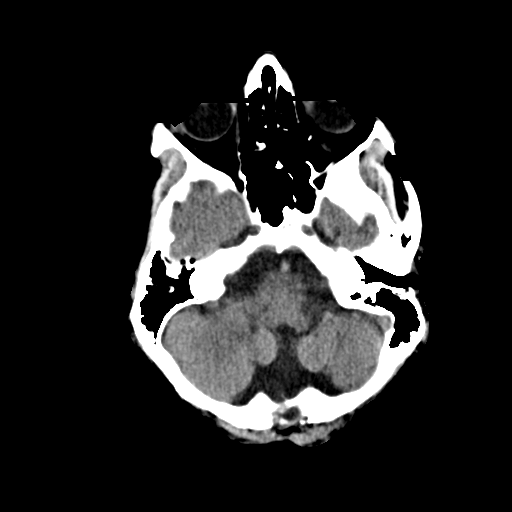
[im 11/36  brain]
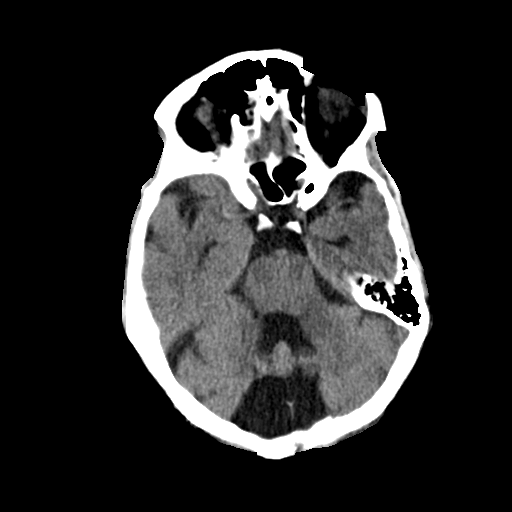
[im 16/36  brain]
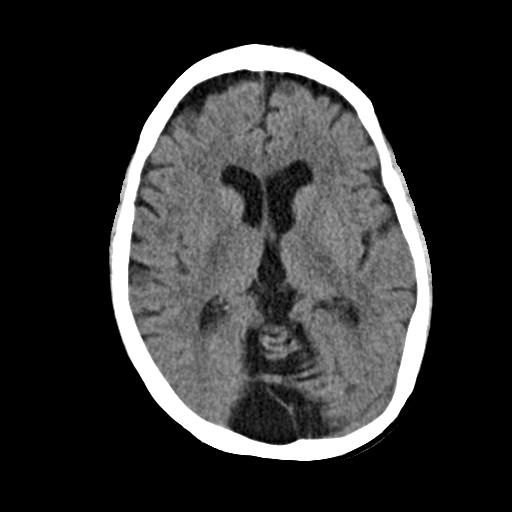
[im 20/36  brain]
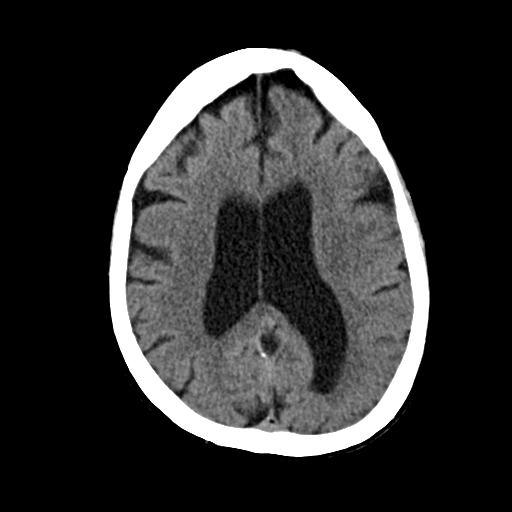
[im 20/36  bone]
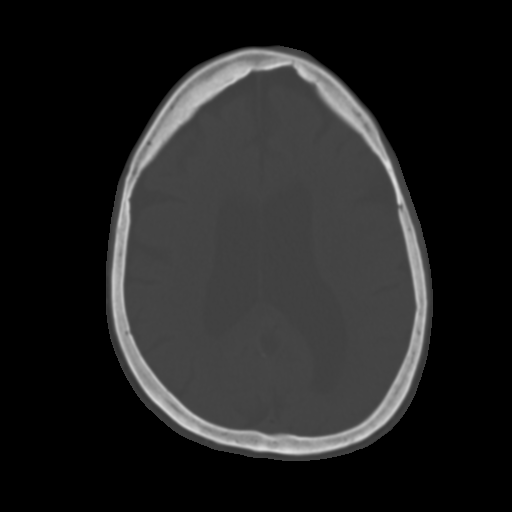
[im 25/36  brain]
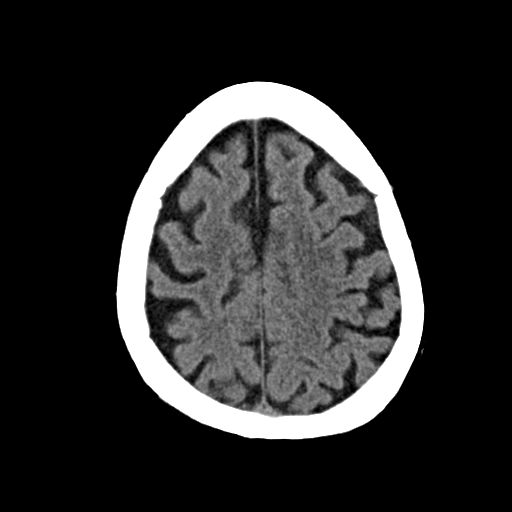
[im 28/36  brain]
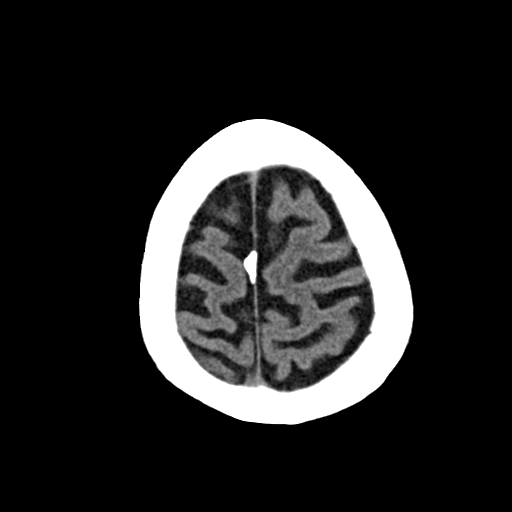
[im 33/36  brain]
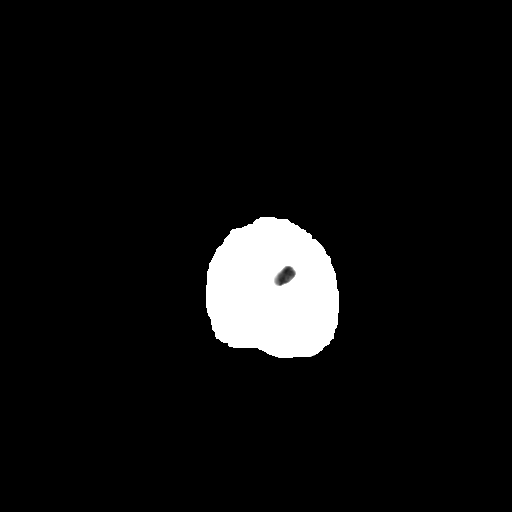

[Series 4: head 3.0 mpr cor · coronal · 0.37mm/px · 3 of 73 slices shown]
[im 25/73  brain]
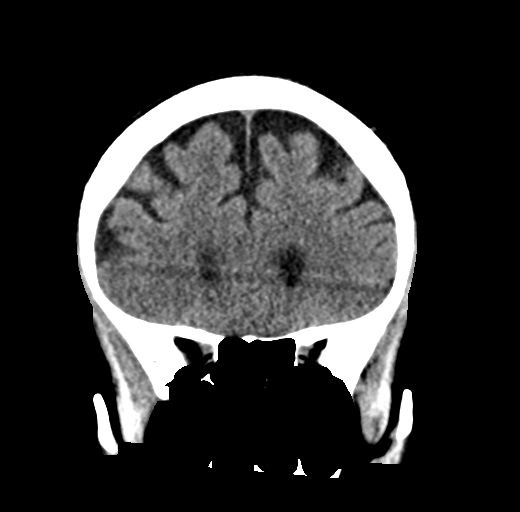
[im 33/73  brain]
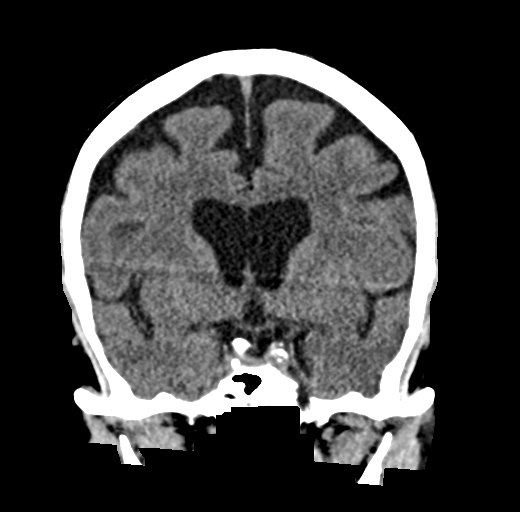
[im 41/73  brain]
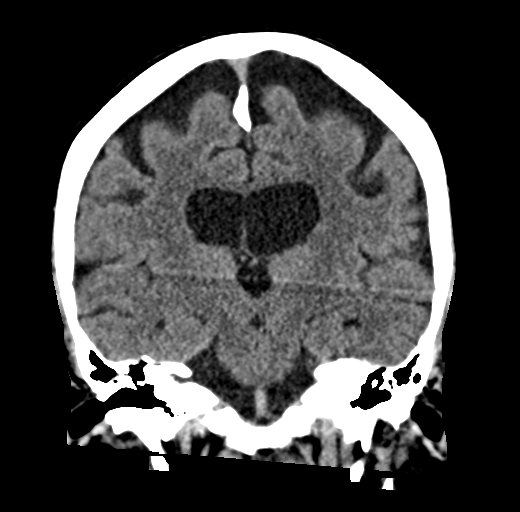

[Series 5: head 3.0 mpr sag · sagittal · 0.39mm/px · 3 of 60 slices shown]
[im 20/60  brain]
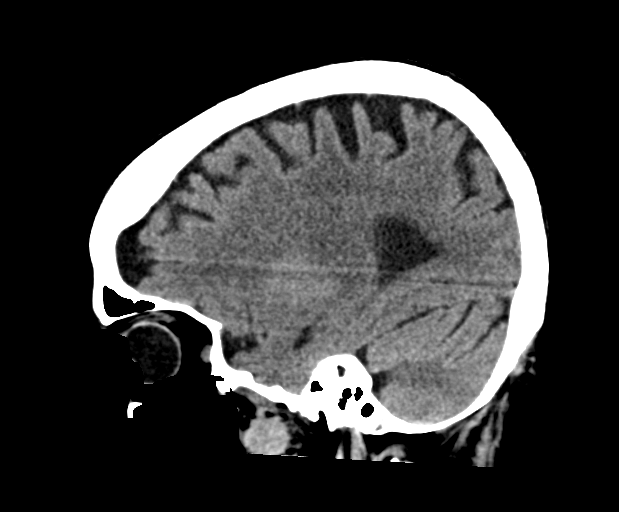
[im 30/60  brain]
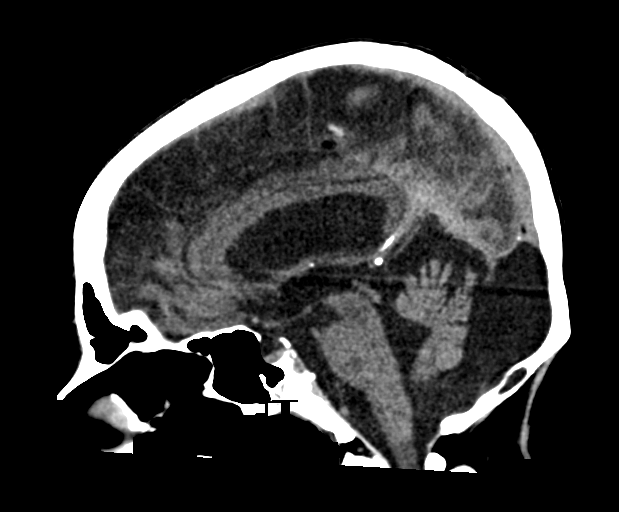
[im 40/60  brain]
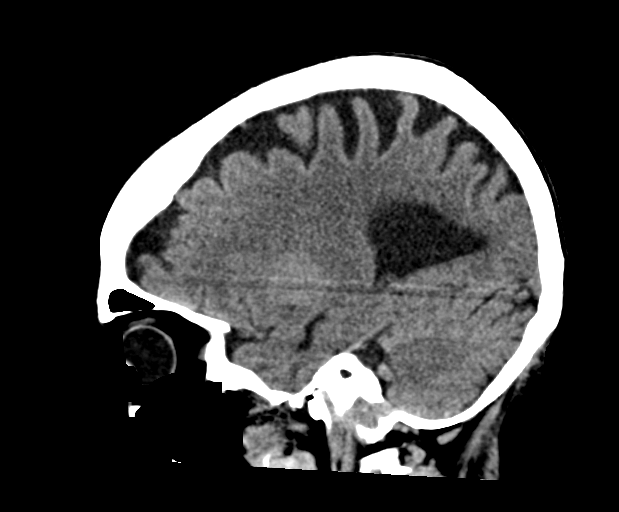

[14 of 47 positions shown; findings below may reference images not displayed]

FINDINGS: CT HEAD FINDINGS

Brain: No evidence of acute infarction, hemorrhage, hydrocephalus,
acute extra-axial collection or mass lesion/mass effect. Again noted
is a posterior fossa arachnoid cyst measuring 6.8 cm in length by
4.8 x 3.2 cm eccentric slightly to the right the mostly in the
midline posteriorly.

There is no associated downward mass effect with again noted
secondary deformity of the medial cerebellar folia.

There is mild-to-moderate atrophy and atrophic ventriculomegaly,
mild small-vessel disease of the cerebral white matter, mild
cerebellar atrophy. There is dystrophic calcification in the falx
near the vertex.

Vascular: There are scattered calcifications in the carotid siphons,
distal left vertebral artery. There are no hyperdense central
vessels.

Skull: The calvarium skull base and orbits are intact. Prominent
arachnoid granulations are again noted in the occipital bone in the
midline and right of midline, unchanged. No suspicious bone lesion.
There is mild swelling in the left frontal scalp.

Sinuses/Orbits: There is mild scattered membrane thickening in the
ethmoids. Other sinuses, bilateral mastoid air cells are clear.

Other: None.

CT CERVICAL SPINE FINDINGS

Alignment: Normal.

Skull base and vertebrae: No acute fracture. No primary bone lesion
or focal pathologic process. Bone mineralization is mildly
osteopenic. Joint space loss and spurring of the anterior
atlantodental joint are again shown.

There is joint space loss and remodeling in both TMJs, which was
seen previously.

Soft tissues and spinal canal: No prevertebral fluid or swelling. No
visible canal hematoma. There are small calcifications at the right
carotid bifurcation. Right palatine tonsillar enlargement noted
previously is no longer seen.

Disc levels: There is moderate disc space loss C4-5, C5-6 and C6-7,
at C5-6 and C6-7 with bidirectional osteophytes, posterior disc
osteophyte complexes mildly encroaching on the left ventral cord
surface at these 2 levels.

Other discs are normal in heights. There is no high-grade spinal
canal stenosis.

Due to uncinate joint and facet spurring there is mild/moderate
foraminal stenosis at C5-6 and C6-7.

Upper chest: Negative except for a calcified granuloma in the left
lung apex.

Other: None.
IMPRESSION: 1. Left forehead scalp swelling with no acute intracranial CT
findings or depressed skull fractures. Stable occipital bone
arachnoid granulations in the midline.
2. Stable posterior fossa arachnoid cyst.  No downward mass effect.
3. Osteopenia and degenerative change without evidence of cervical
fracture or malalignment.

## 2022-10-19 ENCOUNTER — Ambulatory Visit (INDEPENDENT_AMBULATORY_CARE_PROVIDER_SITE_OTHER): Payer: PPO | Admitting: *Deleted

## 2022-10-19 DIAGNOSIS — Z0000 Encounter for general adult medical examination without abnormal findings: Secondary | ICD-10-CM | POA: Diagnosis not present

## 2022-10-19 NOTE — Progress Notes (Signed)
Subjective:   Meredith Dominguez is a 71 y.o. female who presents for Medicare Annual (Subsequent) preventive examination.  I connected with  Meredith ArisMarilyn Grime on 10/19/22 by a audio enabled telemedicine application and verified that I am speaking with the correct person using two identifiers.  Patient Location: Home  Provider Location: Office/Clinic  I discussed the limitations of evaluation and management by telemedicine. The patient expressed understanding and agreed to proceed.   Review of Systems    Defer to PCP Cardiac Risk Factors include: advanced age (>[id number], 68>65 women)     Objective:    There were no vitals filed for this visit. There is no height or weight on file to calculate BMI.     10/19/2022    3:14 PM 01/03/2022    3:06 AM 12/21/2019   12:38 PM 12/13/2019   11:26 PM 05/05/2019    9:03 AM 04/29/2018    4:20 PM 08/05/2017    8:26 PM  Advanced Directives  Does Patient Have a Medical Advance Directive? Yes Yes No No No No No  Type of Estate agentAdvance Directive Healthcare Power of ManitoAttorney;Living will Healthcare Power of [place]orney;Living will       Does patient want to make changes to medical advance directive? No - Patient declined No - Patient declined       Copy of Healthcare Power of Attorney in Chart? No - copy requested No - copy requested       Would patient like information on creating a medical advance directive?   No - Patient declined  No - Patient declined Yes (MAU/Ambulatory/Procedural Areas - Information given)     Current Medications (verified) Outpatient Encounter Medications as of 10/19/2022  Medication Sig   magnesium 30 MG tablet Take 30 mg by mouth 2 (two) times daily.   vitamin B-12 (CYANOCOBALAMIN) 500 MCG tablet Take 500 mcg by mouth daily.   No facility-administered encounter medications on file as of 10/19/2022.    Allergies (verified) Gluten meal, Monosodium glutamate, Other, Sesame oil, Tomato, Wheat bran, and Sulfa antibiotics   History: Past  Medical History:  Diagnosis Date   Anemia    Osteoporosis 05/15/2019   Pharyngitis 05/25/2017   Von Willebrand disease    Past Surgical History:  Procedure Laterality Date   BREAST SURGERY     Breast Biopsy Left x2   Family History  Problem Relation Age of Onset   Leukemia Cousin    Cancer Mother    Diabetes Father    Heart disease Father    Bleeding Disorder Neg Hx    Social History   Socioeconomic History   Marital status: Divorced    Spouse name: Not on file   Number of children: Not on file   Years of education: Not on file   Highest education level: Not on file  Occupational History   Not on file  Tobacco Use   Smoking status: Never   Smokeless tobacco: Never  Vaping Use   Vaping Use: Never used  Substance and Sexual Activity   Alcohol use: Yes    Comment: wine once per week   Drug use: No   Sexual activity: Not Currently  Other Topics Concern   Not on file  Social History Narrative   Not on file   Social Determinants of Health   Financial Resource Strain: Low Risk  (10/19/2022)   Overall Financial Resource Strain (CARDIA)    Difficulty of Paying Living Expenses: Not hard at all  Food Insecurity: No Food Insecurity (  10/19/2022)   Hunger Vital Sign    Worried About Running Out of Food in the Last Year: Never true    Ran Out of Food in the Last Year: Never true  Transportation Needs: No Transportation Needs (10/19/2022)   PRAPARE - Administrator, Civil Service (Medical): No    Lack of Transportation (Non-Medical): No  Physical Activity: Insufficiently Active (10/13/2021)   Exercise Vital Sign    Days of Exercise per Week: 4 days    Minutes of Exercise per Session: 30 min  Stress: No Stress Concern Present (10/19/2022)   [employer] of Occupational Health - Occupational Stress Questionnaire    Feeling of Stress : Only a little  Social Connections: Moderately Isolated (10/19/2022)   Social Connection and Isolation Panel [NHANES]     Frequency of Communication with Friends and Family: More than three times a week    Frequency of Social Gatherings with Friends and Family: Never    Attends Religious Services: Never    Database administrator or Organizations: Yes    Attends Engineer, structural: More than 4 times per year    Marital Status: Divorced    Tobacco Counseling Counseling given: Not Answered   Clinical Intake:  Pre-visit preparation completed: Yes  Pain : No/denies pain  Nutritional Risks: None Diabetes: No  How often do you need to have someone help you when you read instructions, pamphlets, or other written materials from your doctor or pharmacy?: 1 - Never  Activities of Daily Living    10/19/2022    3:16 PM  In your present state of health, do you have any difficulty performing the following activities:  Hearing? 1  Comment wears hearing aids  Vision? 0  Difficulty concentrating or making decisions? 0  Walking or climbing stairs? 0  Dressing or bathing? 0  Doing errands, shopping? 0  Preparing Food and eating ? N  Using the Toilet? N  In the past six months, have you accidently leaked urine? Y  Comment wears pad  Do you have problems with loss of bowel control? N  Managing your Medications? N  Managing your Finances? N  Housekeeping or managing your Housekeeping? N    Patient Care Team: Copland, Gwenlyn Found, MD as PCP - General (Family Medicine) Clenton Pare., MD (Hematology)  Indicate any recent Medical Services you may have received from other than [facility] providers in the past year (date may be approximate).     Assessment:   This is a routine wellness examination for Tiwanda.  Hearing/Vision screen No results found.  Dietary issues and exercise activities discussed: Current Exercise Habits: Structured exercise class, Type of exercise: Other - see comments (silver sneakers), Time (Minutes): 60, Frequency (Times/Week): 2, Weekly Exercise (Minutes/Week): 120,  Intensity: Mild, Exercise limited by: None identified   Goals Addressed   None    Depression Screen    10/19/2022    3:02 PM 10/13/2021    3:50 PM 05/05/2019    9:04 AM 04/29/2018    4:21 PM 04/28/2017    4:36 PM  PHQ 2/9 Scores  PHQ - 2 Score 0 0 0 0 0    Fall Risk    10/19/2022    3:03 PM 10/13/2021    3:46 PM 10/13/2021   12:53 PM 05/05/2019    9:04 AM 04/29/2018    4:21 PM  Fall Risk   Falls in the past year? 0 0 0 1 No  Number falls in past yr:  0 0 0 0   Injury with Fall? 0 0 0 0   Risk for fall due to : No Fall Risks      Follow up Falls evaluation completed Falls prevention discussed       FALL RISK PREVENTION PERTAINING TO THE HOME:  Any stairs in or around the home? No  If so, are there any without handrails? No  Home free of loose throw rugs in walkways, pet beds, electrical cords, etc? Yes  Adequate lighting in your home to reduce risk of falls? Yes   ASSISTIVE DEVICES UTILIZED TO PREVENT FALLS:  Life alert? No  Use of a cane, walker or w/c? No  Grab bars in the bathroom? No  Shower chair or bench in shower? No  Elevated toilet seat or a handicapped toilet? No   TIMED UP AND GO:  Was the test performed?  No, audio visit .    Cognitive Function:        Immunizations Immunization History  Administered Date(s) Administered   Hepatitis B, PED/ADOLESCENT 09/30/1998, 11/04/1998, 04/07/1999   PPD Test 07/15/2016   Td 02/14/1996   Tdap 02/07/2009    TDAP status: Due, Education has been provided regarding the importance of this vaccine. Advised may receive this vaccine at local pharmacy or Health Dept. Aware to provide a copy of the vaccination record if obtained from local pharmacy or Health Dept. Verbalized acceptance and understanding.  Flu Vaccine status: Due, Education has been provided regarding the importance of this vaccine. Advised may receive this vaccine at local pharmacy or Health Dept. Aware to provide a copy of the vaccination record if  obtained from local pharmacy or Health Dept. Verbalized acceptance and understanding.  Pneumococcal vaccine status: Due, Education has been provided regarding the importance of this vaccine. Advised may receive this vaccine at local pharmacy or Health Dept. Aware to provide a copy of the vaccination record if obtained from local pharmacy or Health Dept. Verbalized acceptance and understanding.  Covid-19 vaccine status: Information provided on how to obtain vaccines.   Qualifies for Shingles Vaccine? Yes   Zostavax completed No   Shingrix Completed?: No.    Education has been provided regarding the importance of this vaccine. Patient has been advised to call insurance company to determine out of pocket expense if they have not yet received this vaccine. Advised may also receive vaccine at local pharmacy or Health Dept. Verbalized acceptance and understanding.  Screening Tests Health Maintenance  Topic Date Due   COVID-19 Vaccine (1) Never done   Zoster Vaccines- Shingrix (1 of 2) Never done   COLONOSCOPY (Pts 45-65yrs Insurance coverage will need to be confirmed)  Never done   MAMMOGRAM  Never done   Pneumonia Vaccine 60+ Years old (1 - PCV) Never done   INFLUENZA VACCINE  Never done   Medicare Annual Wellness (AWV)  10/13/2022   DEXA SCAN  Completed   Hepatitis C Screening  Completed   HPV VACCINES  Aged Out    Health Maintenance  Health Maintenance Due  Topic Date Due   COVID-19 Vaccine (1) Never done   Zoster Vaccines- Shingrix (1 of 2) Never done   COLONOSCOPY (Pts 45-52yrs Insurance coverage will need to be confirmed)  Never done   MAMMOGRAM  Never done   Pneumonia Vaccine 29+ Years old (1 - PCV) Never done   INFLUENZA VACCINE  Never done   Medicare Annual Wellness (AWV)  10/13/2022    Colorectal cancer screening: No longer required. Pt preference  Mammogram status: No longer required due to Pt preference.  Bone Density status: Completed 05/12/19. Results reflect: Bone  density results: OSTEOPOROSIS. Repeat every 2 years.  Lung Cancer Screening: (Low Dose CT Chest recommended if Age 18-80 years, 30 pack-year currently smoking OR have quit w/in 15years.) does not qualify.    Additional Screening:  Hepatitis C Screening: does qualify; Completed 02/10/17  Vision Screening: Recommended annual ophthalmology exams for early detection of glaucoma and other disorders of the eye. Is the patient up to date with their annual eye exam?  Yes  Who is the provider or what is the name of the office in which the patient attends annual eye exams? [facility]. If pt is not established with a provider, would they like to be referred to a provider to establish care? No .   Dental Screening: Recommended annual dental exams for proper oral hygiene  Community Resource Referral / Chronic Care Management: CRR required this visit?  No   CCM required this visit?  No      Plan:     I have personally reviewed and noted the following in the patient's chart:   Medical and social history Use of alcohol, tobacco or illicit drugs  Current medications and supplements including opioid prescriptions. Patient is not currently taking opioid prescriptions. Functional ability and status Nutritional status Physical activity Advanced directives List of other physicians Hospitalizations, surgeries, and ER visits in previous 12 months Vitals Screenings to include cognitive, depression, and falls Referrals and appointments  In addition, I have reviewed and discussed with patient certain preventive protocols, quality metrics, and best practice recommendations. A written personalized care plan for preventive services as well as general preventive health recommendations were provided to patient.   Due to this being a telephonic visit, the after visit summary with patients personalized plan was offered to patient via mail or my-chart. Patient would like to access on my-chart.  Donne Anon, [place]   10/19/2022   Nurse Notes: None

## 2022-10-19 NOTE — Patient Instructions (Signed)
Meredith Dominguez , Thank you for taking time to come for your Medicare Wellness Visit. I appreciate your ongoing commitment to your health goals. Please review the following plan we discussed and let me know if I can assist you in the future.   These are the goals we discussed:  Goals       Patient Stated      Work on getting a book published & work on [place] (pt-stated)      Take a long vacation as planned.        This is a list of the screening recommended for you and due dates:  Health Maintenance  Topic Date Due   COVID-19 Vaccine (1) Never done   Zoster (Shingles) Vaccine (1 of 2) Never done   Colon Cancer Screening  Never done   Mammogram  Never done   Pneumonia Vaccine (1 - PCV) Never done   Flu Shot  Never done   Medicare Annual Wellness Visit  10/20/2023   DEXA scan (bone density measurement)  Completed   Hepatitis C Screening: USPSTF Recommendation to screen - Ages 31-79 yo.  Completed   HPV Vaccine  Aged Out     Next appointment: Follow up in one year for your annual wellness visit    Preventive Care 65 Years and Older, Female Preventive care refers to lifestyle choices and visits with your health care provider that can promote health and wellness. What does preventive care include? A yearly physical exam. This is also called an annual well check. Dental exams once or twice a year. Routine eye exams. Ask your health care provider how often you should have your eyes checked. Personal lifestyle choices, including: Daily care of your teeth and gums. Regular physical activity. Eating a healthy diet. Avoiding tobacco and drug use. Limiting alcohol use. Practicing safe sex. Taking low-dose aspirin every day. Taking vitamin and mineral supplements as recommended by your health care provider. What happens during an annual well check? The services and screenings done by your health care provider during your annual well check will depend  on your age, overall health, lifestyle risk factors, and family history of disease. Counseling  Your health care provider may ask you questions about your: Alcohol use. Tobacco use. Drug use. Emotional well-being. Home and relationship well-being. Sexual activity. Eating habits. History of falls. Memory and ability to understand (cognition). Work and work Statistician. Reproductive health. Screening  You may have the following tests or measurements: Height, weight, and BMI. Blood pressure. Lipid and cholesterol levels. These may be checked every 5 years, or more frequently if you are over 73 years old. Skin check. Lung cancer screening. You may have this screening every year starting at age 61 if you have a 30-pack-year history of smoking and currently smoke or have quit within the past 15 years. Fecal occult blood test (FOBT) of the stool. You may have this test every year starting at age 94. Flexible sigmoidoscopy or colonoscopy. You may have a sigmoidoscopy every 5 years or a colonoscopy every 10 years starting at age 50. Hepatitis C blood test. Hepatitis B blood test. Sexually transmitted disease (STD) testing. Diabetes screening. This is done by checking your blood sugar (glucose) after you have not eaten for a while (fasting). You may have this done every 1-3 years. Bone density scan. This is done to screen for osteoporosis. You may have this done starting at age 10. Mammogram. This may be done  every 1-2 years. Talk to your health care provider about how often you should have regular mammograms. Talk with your health care provider about your test results, treatment options, and if necessary, the need for more tests. Vaccines  Your health care provider may recommend certain vaccines, such as: Influenza vaccine. This is recommended every year. Tetanus, diphtheria, and acellular pertussis (Tdap, Td) vaccine. You may need a Td booster every 10 years. Zoster vaccine. You may need  this after age 26. Pneumococcal 13-valent conjugate (PCV13) vaccine. One dose is recommended after age 55. Pneumococcal polysaccharide (PPSV23) vaccine. One dose is recommended after age 76. Talk to your health care provider about which screenings and vaccines you need and how often you need them. This information is not intended to replace advice given to you by your health care provider. Make sure you discuss any questions you have with your health care provider. Document Released: 12/06/2015 Document Revised: 07/29/2016 Document Reviewed: 09/10/2015 Elsevier Interactive Patient Education  2017 [employer] Prevention in the Home Falls can cause injuries. They can happen to people of all ages. There are many things you can do to make your home safe and to help prevent falls. What can I do on the outside of my home? Regularly fix the edges of walkways and driveways and fix any cracks. Remove anything that might make you trip as you walk through a door, such as a raised step or threshold. Trim any bushes or trees on the path to your home. Use bright outdoor lighting. Clear any walking paths of anything that might make someone trip, such as rocks or tools. Regularly check to see if handrails are loose or broken. Make sure that both sides of any steps have handrails. Any raised decks and porches should have guardrails on the edges. Have any leaves, snow, or ice cleared regularly. Use sand or salt on walking paths during winter. Clean up any spills in your garage right away. This includes oil or grease spills. What can I do in the bathroom? Use night lights. Install grab bars by the toilet and in the tub and shower. Do not use towel bars as grab bars. Use non-skid mats or decals in the tub or shower. If you need to sit down in the shower, use a plastic, non-slip stool. Keep the floor dry. Clean up any water that spills on the floor as soon as it happens. Remove soap buildup in the tub  or shower regularly. Attach bath mats securely with double-sided non-slip rug tape. Do not have throw rugs and other things on the floor that can make you trip. What can I do in the bedroom? Use night lights. Make sure that you have a light by your bed that is easy to reach. Do not use any sheets or blankets that are too big for your bed. They should not hang down onto the floor. Have a firm chair that has side arms. You can use this for support while you get dressed. Do not have throw rugs and other things on the floor that can make you trip. What can I do in the kitchen? Clean up any spills right away. Avoid walking on wet floors. Keep items that you use a lot in easy-to-reach places. If you need to reach something above you, use a strong step stool that has a grab bar. Keep electrical cords out of the way. Do not use floor polish or wax that makes floors slippery. If you must use wax, use  non-skid floor wax. Do not have throw rugs and other things on the floor that can make you trip. What can I do with my stairs? Do not leave any items on the stairs. Make sure that there are handrails on both sides of the stairs and use them. Fix handrails that are broken or loose. Make sure that handrails are as long as the stairways. Check any carpeting to make sure that it is firmly attached to the stairs. Fix any carpet that is loose or worn. Avoid having throw rugs at the top or bottom of the stairs. If you do have throw rugs, attach them to the floor with carpet tape. Make sure that you have a light switch at the top of the stairs and the bottom of the stairs. If you do not have them, ask someone to add them for you. What else can I do to help prevent falls? Wear shoes that: Do not have high heels. Have rubber bottoms. Are comfortable and fit you well. Are closed at the toe. Do not wear sandals. If you use a stepladder: Make sure that it is fully opened. Do not climb a closed stepladder. Make  sure that both sides of the stepladder are locked into place. Ask someone to hold it for you, if possible. Clearly mark and make sure that you can see: Any grab bars or handrails. First and last steps. Where the edge of each step is. Use tools that help you move around (mobility aids) if they are needed. These include: Canes. Walkers. Scooters. Crutches. Turn on the lights when you go into a dark area. Replace any light bulbs as soon as they burn out. Set up your furniture so you have a clear path. Avoid moving your furniture around. If any of your floors are uneven, fix them. If there are any pets around you, be aware of where they are. Review your medicines with your doctor. Some medicines can make you feel dizzy. This can increase your chance of falling. Ask your doctor what other things that you can do to help prevent falls. This information is not intended to replace advice given to you by your health care provider. Make sure you discuss any questions you have with your health care provider. Document Released: 09/05/2009 Document Revised: 04/16/2016 Document Reviewed: 12/14/2014 Elsevier Interactive Patient Education  2017 [employer].

## 2023-02-02 ENCOUNTER — Inpatient Hospital Stay: Payer: Medicare HMO | Admitting: Medical Oncology

## 2023-02-02 ENCOUNTER — Inpatient Hospital Stay: Payer: Medicare HMO

## 2023-02-03 ENCOUNTER — Other Ambulatory Visit: Payer: Medicare HMO

## 2023-02-03 ENCOUNTER — Encounter: Payer: Medicare HMO | Admitting: Family

## 2023-02-05 ENCOUNTER — Inpatient Hospital Stay: Payer: Medicare HMO | Attending: Hematology & Oncology

## 2023-02-05 ENCOUNTER — Encounter: Payer: Self-pay | Admitting: Family

## 2023-02-05 ENCOUNTER — Inpatient Hospital Stay: Payer: Medicare HMO | Admitting: Family

## 2023-02-05 ENCOUNTER — Other Ambulatory Visit: Payer: Self-pay | Admitting: Family

## 2023-02-05 VITALS — BP 127/75 | HR 78 | Temp 98.4°F | Resp 17 | Wt 139.1 lb

## 2023-02-05 DIAGNOSIS — D6800 Von Willebrand disease, unspecified: Secondary | ICD-10-CM

## 2023-02-05 DIAGNOSIS — D696 Thrombocytopenia, unspecified: Secondary | ICD-10-CM | POA: Diagnosis not present

## 2023-02-05 DIAGNOSIS — D509 Iron deficiency anemia, unspecified: Secondary | ICD-10-CM

## 2023-02-05 DIAGNOSIS — Z79899 Other long term (current) drug therapy: Secondary | ICD-10-CM

## 2023-02-05 DIAGNOSIS — D68020 Von Willebrand disease, type 2a: Secondary | ICD-10-CM

## 2023-02-05 DIAGNOSIS — Z882 Allergy status to sulfonamides status: Secondary | ICD-10-CM | POA: Insufficient documentation

## 2023-02-05 DIAGNOSIS — D472 Monoclonal gammopathy: Secondary | ICD-10-CM

## 2023-02-05 LAB — CBC WITH DIFFERENTIAL (CANCER CENTER ONLY)
Abs Immature Granulocytes: 0.03 10*3/uL (ref 0.00–0.07)
Basophils Absolute: 0.1 10*3/uL (ref 0.0–0.1)
Basophils Relative: 1 %
Eosinophils Absolute: 0.2 10*3/uL (ref 0.0–0.5)
Eosinophils Relative: 3 %
HCT: 39.8 % (ref 36.0–46.0)
Hemoglobin: 13 g/dL (ref 12.0–15.0)
Immature Granulocytes: 0 %
Lymphocytes Relative: 21 %
Lymphs Abs: 1.7 10*3/uL (ref 0.7–4.0)
MCH: 28.6 pg (ref 26.0–34.0)
MCHC: 32.7 g/dL (ref 30.0–36.0)
MCV: 87.7 fL (ref 80.0–100.0)
Monocytes Absolute: 0.7 10*3/uL (ref 0.1–1.0)
Monocytes Relative: 8 %
Neutro Abs: 5.3 10*3/uL (ref 1.7–7.7)
Neutrophils Relative %: 67 %
Platelet Count: 72 10*3/uL — ABNORMAL LOW (ref 150–400)
RBC: 4.54 MIL/uL (ref 3.87–5.11)
RDW: 13.9 % (ref 11.5–15.5)
WBC Count: 8 10*3/uL (ref 4.0–10.5)
nRBC: 0 % (ref 0.0–0.2)

## 2023-02-05 LAB — CMP (CANCER CENTER ONLY)
ALT: 10 U/L (ref 0–44)
AST: 17 U/L (ref 15–41)
Albumin: 4.3 g/dL (ref 3.5–5.0)
Alkaline Phosphatase: 75 U/L (ref 38–126)
Anion gap: 8 (ref 5–15)
BUN: 23 mg/dL (ref 8–23)
CO2: 27 mmol/L (ref 22–32)
Calcium: 9.3 mg/dL (ref 8.9–10.3)
Chloride: 104 mmol/L (ref 98–111)
Creatinine: 0.98 mg/dL (ref 0.44–1.00)
GFR, Estimated: >60 mL/min (ref >60)
Glucose, Bld: 92 mg/dL (ref 70–99)
Potassium: 4.4 mmol/L (ref 3.5–5.1)
Sodium: 139 mmol/L (ref 135–145)
Total Bilirubin: 0.4 mg/dL (ref 0.3–1.2)
Total Protein: 7 g/dL (ref 6.5–8.1)

## 2023-02-05 LAB — SAVE SMEAR(SSMR), FOR PROVIDER SLIDE REVIEW

## 2023-02-05 LAB — LACTATE DEHYDROGENASE: LDH: 180 U/L (ref 98–192)

## 2023-02-05 NOTE — Progress Notes (Signed)
Hematology and Oncology Follow Up Visit  Meredith Dominguez [id number] 23-Aug-1951 72 y.o. 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  24 hour urine was negative with PCP. She was noted to have an elevation in her lambda light chains  37.53 mg/dL.  Platelets are 72, Hgb 13.0, MCV 87 and WBC count 9.0.  She has history of arthritis and osteoporosis.  No fever, chills, n/v, cough, rash, dizziness, SOB, chest pain, palpitations, abdominal pain or changes in bowel or bladder habits.  No blood loss, bruising or petechiae noted.  No swelling, tenderness, numbness or tingling in her extremities.  No falls or syncope.  Appetite and hydration are good. Weight is stable at 139 lbs.  She does a lot of holistic practices including energy healing and plans to use a medi bed.  She goes for thermogram instead of mammogram.  She also has imaging done or her spleen and liver regularly. She states that this imaging has been fine.  She lifts 3 lb weights.   ECOG Performance Status: 0 - Asymptomatic  Medications:  Allergies as of 02/05/2023       Reactions   Gluten Meal Other (See Comments)   sensitivity   Monosodium Glutamate Other (See Comments)   Hot and feels bad   Other Other (See Comments)   Red places   Sesame Oil Rash   Turns bright, ear and anus open and close.   Tomato Other (See Comments)   Other night shade vegetables like eggplant, potato   Wheat Other (See Comments)   Stomach issues   Sulfa Antibiotics Rash        Medication List        Accurate as of February 05, 2023  4:51 PM. If you have any questions, ask your nurse or doctor.          magnesium 30 MG tablet Take 30 mg by mouth 2 (two) times daily.   Magnesium Citrate  125 MG Caps Take 150 mg by mouth daily at 6 (six) AM.   NONFORMULARY OR COMPOUNDED ITEM Thyroid support   NONFORMULARY OR COMPOUNDED ITEM Adrenal by Venko   NONFORMULARY OR COMPOUNDED ITEM Digestin   NONFORMULARY OR COMPOUNDED ITEM Gluten dairy digest PRN   NONFORMULARY OR COMPOUNDED ITEM Cyruta Plus 3/daily   NUTRA-SUPPORT BONE PO Take 3 tablets by mouth daily at 6 (six) AM.   vitamin B-12 500 MCG tablet Commonly known as: CYANOCOBALAMIN Take 500 mcg by mouth daily.        Allergies:  Allergies  Allergen Reactions   Gluten Meal Other (See Comments)    sensitivity   Monosodium Glutamate Other (See Comments)    Hot and feels bad   Other Other (See Comments)    Red places   Sesame Oil Rash    Turns bright, ear and anus open and close.   Tomato Other (See Comments)    Other night shade vegetables like eggplant, potato   Wheat Other (See Comments)    Stomach issues   Sulfa Antibiotics Rash    Past Medical History, Surgical history, Social history, and Family History were reviewed and updated.  Review of Systems: All other 10 point review of systems is negative.   Physical Exam:  weight is  139 lb 1.3 oz (63.1 kg). Her oral temperature is 98.4 F (36.9 C). Her blood pressure is 127/75 and her pulse is 78. Her respiration is 17 and oxygen saturation is 100%.   Wt Readings from Last 3 Encounters:  02/05/23 139 lb 1.3 oz (63.1 kg)  03/18/22 139 lb (63 kg)  01/03/22 137 lb (62.1 kg)    Ocular: Sclerae unicteric, pupils equal, round and reactive to light Ear-nose-throat: Oropharynx clear, dentition fair Lymphatic: No cervical or supraclavicular adenopathy Lungs no rales or rhonchi, good excursion bilaterally Heart regular rate and rhythm, no murmur appreciated Abd soft, nontender, positive bowel sounds MSK no focal spinal tenderness, no joint edema Neuro: non-focal, well-oriented, appropriate affect Breasts: Deferred   Lab Results  Component Value Date    WBC 8.0 02/05/2023   HGB 13.0 02/05/2023   HCT 39.8 02/05/2023   MCV 87.7 02/05/2023   PLT 72 (L) 02/05/2023   No results found for: "FERRITIN", "IRON", "TIBC", "UIBC", "IRONPCTSAT" Lab Results  Component Value Date   RBC 4.54 02/05/2023   No results found for: "KPAFRELGTCHN", "LAMBDASER", "KAPLAMBRATIO" No results found for: "IGGSERUM", "IGA", "IGMSERUM" No results found for: "TOTALPROTELP", "ALBUMINELP", "A1GS", "A2GS", "BETS", "BETA2SER", "GAMS", "MSPIKE", "SPEI"   Chemistry      Component Value Date/Time   NA 139 02/05/2023 1447   K 4.4 02/05/2023 1447   CL 104 02/05/2023 1447   CO2 27 02/05/2023 1447   BUN 23 02/05/2023 1447   CREATININE 0.98 02/05/2023 1447      Component Value Date/Time   CALCIUM 9.3 02/05/2023 1447   ALKPHOS 75 02/05/2023 1447   AST 17 02/05/2023 1447   ALT 10 02/05/2023 1447   BILITOT 0.4 02/05/2023 1447       Impression and Plan: Meredith Dominguez is a a very pleasant 72 yo caucasian female with history of type IIA Von Willebrand disease as well as thrombocytopenia.  Platelets remain stable. VWB panel is pending.  We will have her come back next week for full protein studies.  Follow-up pending results.   [name], NP 3/15/20244:51 PM

## 2023-02-08 LAB — VON WILLEBRAND PANEL
Coagulation Factor VIII: 88 % (ref 56–140)
Ristocetin Co-factor, Plasma: 12 % — ABNORMAL LOW (ref 50–200)
Von Willebrand Antigen, Plasma: 100 % (ref 50–200)

## 2023-02-08 LAB — COAG STUDIES INTERP REPORT

## 2023-02-22 ENCOUNTER — Inpatient Hospital Stay: Payer: Medicare HMO | Attending: Medical Oncology

## 2023-02-22 DIAGNOSIS — D696 Thrombocytopenia, unspecified: Secondary | ICD-10-CM | POA: Insufficient documentation

## 2023-02-22 DIAGNOSIS — D68020 Von Willebrand disease, type 2a: Secondary | ICD-10-CM | POA: Diagnosis present

## 2023-02-22 DIAGNOSIS — Z79899 Other long term (current) drug therapy: Secondary | ICD-10-CM | POA: Insufficient documentation

## 2023-02-22 DIAGNOSIS — D472 Monoclonal gammopathy: Secondary | ICD-10-CM | POA: Diagnosis not present

## 2023-02-23 LAB — KAPPA/LAMBDA LIGHT CHAINS
Kappa free light chain: 27 mg/L — ABNORMAL HIGH (ref 3.3–19.4)
Kappa, lambda light chain ratio: 0.09 — ABNORMAL LOW (ref 0.26–1.65)
Lambda free light chains: 297.4 mg/L — ABNORMAL HIGH (ref 5.7–26.3)

## 2023-02-24 LAB — PROTEIN ELECTROPHORESIS, SERUM
A/G Ratio: 1.3 (ref 0.7–1.7)
Albumin ELP: 3.7 g/dL (ref 2.9–4.4)
Alpha-1-Globulin: 0.2 g/dL (ref 0.0–0.4)
Alpha-2-Globulin: 0.8 g/dL (ref 0.4–1.0)
Beta Globulin: 0.9 g/dL (ref 0.7–1.3)
Gamma Globulin: 1 g/dL (ref 0.4–1.8)
Globulin, Total: 2.8 g/dL (ref 2.2–3.9)
Total Protein ELP: 6.5 g/dL (ref 6.0–8.5)

## 2023-02-24 LAB — IGG, IGA, IGM
IgA: 125 mg/dL (ref 64–422)
IgG (Immunoglobin G), Serum: 970 mg/dL (ref 586–1602)
IgM (Immunoglobulin M), Srm: 107 mg/dL (ref 26–217)

## 2023-04-30 ENCOUNTER — Other Ambulatory Visit: Payer: Self-pay | Admitting: Family

## 2023-04-30 DIAGNOSIS — D68020 Von Willebrand disease, type 2a: Secondary | ICD-10-CM

## 2023-04-30 DIAGNOSIS — D509 Iron deficiency anemia, unspecified: Secondary | ICD-10-CM

## 2023-05-03 ENCOUNTER — Inpatient Hospital Stay: Payer: Medicare HMO | Attending: Medical Oncology

## 2023-05-03 ENCOUNTER — Inpatient Hospital Stay: Payer: Medicare HMO | Admitting: Family

## 2023-05-03 ENCOUNTER — Encounter: Payer: Self-pay | Admitting: Family

## 2023-05-03 ENCOUNTER — Other Ambulatory Visit: Payer: Self-pay

## 2023-05-03 VITALS — BP 112/65 | HR 95 | Temp 97.7°F | Resp 18 | Ht 66.54 in | Wt 136.0 lb

## 2023-05-03 DIAGNOSIS — D68020 Von Willebrand disease, type 2a: Secondary | ICD-10-CM

## 2023-05-03 DIAGNOSIS — Z79899 Other long term (current) drug therapy: Secondary | ICD-10-CM | POA: Insufficient documentation

## 2023-05-03 DIAGNOSIS — D509 Iron deficiency anemia, unspecified: Secondary | ICD-10-CM | POA: Diagnosis not present

## 2023-05-03 DIAGNOSIS — D472 Monoclonal gammopathy: Secondary | ICD-10-CM | POA: Diagnosis not present

## 2023-05-03 DIAGNOSIS — D696 Thrombocytopenia, unspecified: Secondary | ICD-10-CM | POA: Insufficient documentation

## 2023-05-03 LAB — CMP (CANCER CENTER ONLY)
ALT: 10 U/L (ref 0–44)
AST: 15 U/L (ref 15–41)
Albumin: 4.4 g/dL (ref 3.5–5.0)
Alkaline Phosphatase: 68 U/L (ref 38–126)
Anion gap: 9 (ref 5–15)
BUN: 27 mg/dL — ABNORMAL HIGH (ref 8–23)
CO2: 26 mmol/L (ref 22–32)
Calcium: 9.5 mg/dL (ref 8.9–10.3)
Chloride: 102 mmol/L (ref 98–111)
Creatinine: 1.07 mg/dL — ABNORMAL HIGH (ref 0.44–1.00)
GFR, Estimated: 55 mL/min — ABNORMAL LOW (ref >60)
Glucose, Bld: 98 mg/dL (ref 70–99)
Potassium: 4.9 mmol/L (ref 3.5–5.1)
Sodium: 137 mmol/L (ref 135–145)
Total Bilirubin: 0.6 mg/dL (ref 0.3–1.2)
Total Protein: 7.2 g/dL (ref 6.5–8.1)

## 2023-05-03 LAB — CBC WITH DIFFERENTIAL (CANCER CENTER ONLY)
Abs Immature Granulocytes: 0.03 10*3/uL (ref 0.00–0.07)
Basophils Absolute: 0.1 10*3/uL (ref 0.0–0.1)
Basophils Relative: 1 %
Eosinophils Absolute: 0.1 10*3/uL (ref 0.0–0.5)
Eosinophils Relative: 1 %
HCT: 42 % (ref 36.0–46.0)
Hemoglobin: 13.7 g/dL (ref 12.0–15.0)
Immature Granulocytes: 0 %
Lymphocytes Relative: 13 %
Lymphs Abs: 1.1 10*3/uL (ref 0.7–4.0)
MCH: 28.3 pg (ref 26.0–34.0)
MCHC: 32.6 g/dL (ref 30.0–36.0)
MCV: 86.8 fL (ref 80.0–100.0)
Monocytes Absolute: 0.6 10*3/uL (ref 0.1–1.0)
Monocytes Relative: 7 %
Neutro Abs: 6.9 10*3/uL (ref 1.7–7.7)
Neutrophils Relative %: 78 %
Platelet Count: 85 10*3/uL — ABNORMAL LOW (ref 150–400)
RBC: 4.84 MIL/uL (ref 3.87–5.11)
RDW: 14.3 % (ref 11.5–15.5)
WBC Count: 8.8 10*3/uL (ref 4.0–10.5)
nRBC: 0 % (ref 0.0–0.2)

## 2023-05-03 LAB — LACTATE DEHYDROGENASE: LDH: 205 U/L — ABNORMAL HIGH (ref 98–192)

## 2023-05-03 NOTE — Progress Notes (Signed)
Hematology and Oncology Follow Up Visit  Meredith Dominguez [id number] 1951-07-28 72 y.o. 05/03/2023   Principle Diagnosis:  Type IIA Von Willebrand disease Thrombocytopenia MGUS - work up pending   Current Therapy:        Observation   Interim History:  Meredith Dominguez is here today for follow-up. She is doing well and has no complaints at this time.  Her platelet count is stable at 85!  She has not noted any blood loss. No petechiae or abnormal bruising.  No fever, chills, n/v, cough, rash, dizziness, SOB, chest pain, palpitations, abdominal pain or changes in bowel or bladder habits.  No swelling, tenderness, numbness or tingling in her extremities.  No falls or syncope reported.  Appetite and hydration are good. Weight is stable at 136 lbs.   ECOG Performance Status: 1 - Symptomatic but completely ambulatory  Medications:  Allergies as of 05/03/2023       Reactions   Gluten Meal Other (See Comments)   sensitivity   Monosodium Glutamate Other (See Comments)   Hot and feels bad   Other Other (See Comments)   Red places   Sesame Oil Rash   Turns bright, ear and anus open and close.   Tomato Other (See Comments)   Other night shade vegetables like eggplant, potato   Wheat Other (See Comments)   Stomach issues   Sulfa Antibiotics Rash        Medication List        Accurate as of May 03, 2023  1:04 PM. If you have any questions, ask your nurse or doctor.          magnesium 30 MG tablet Take 30 mg by mouth 2 (two) times daily.   Magnesium Citrate 125 MG Caps Take 150 mg by mouth daily at 6 (six) AM.   NONFORMULARY OR COMPOUNDED ITEM Thyroid support   NONFORMULARY OR COMPOUNDED ITEM Adrenal by Venko   NONFORMULARY OR COMPOUNDED ITEM Digestin   NONFORMULARY OR COMPOUNDED ITEM Gluten dairy digest PRN   NONFORMULARY OR COMPOUNDED ITEM Cyruta Plus 3/daily   NUTRA-SUPPORT BONE PO Take 3 tablets by mouth daily at 6 (six) AM.   vitamin B-12 500 MCG  tablet Commonly known as: CYANOCOBALAMIN Take 500 mcg by mouth daily.        Allergies:  Allergies  Allergen Reactions   Gluten Meal Other (See Comments)    sensitivity   Monosodium Glutamate Other (See Comments)    Hot and feels bad   Other Other (See Comments)    Red places   Sesame Oil Rash    Turns bright, ear and anus open and close.   Tomato Other (See Comments)    Other night shade vegetables like eggplant, potato   Wheat Other (See Comments)    Stomach issues   Sulfa Antibiotics Rash    Past Medical History, Surgical history, Social history, and Family History were reviewed and updated.  Review of Systems: All other 10 point review of systems is negative.   Physical Exam:  vitals were not taken for this visit.   Wt Readings from Last 3 Encounters:  02/05/23 139 lb 1.3 oz (63.1 kg)  03/18/22 139 lb (63 kg)  01/03/22 137 lb (62.1 kg)    Ocular: Sclerae unicteric, pupils equal, round and reactive to light Ear-nose-throat: Oropharynx clear, dentition fair Lymphatic: No cervical or supraclavicular adenopathy Lungs no rales or rhonchi, good excursion bilaterally Heart regular rate and rhythm, no murmur appreciated Abd soft, nontender, positive bowel  sounds MSK no focal spinal tenderness, no joint edema Neuro: non-focal, well-oriented, appropriate affect Breasts: Deferred   Lab Results  Component Value Date   WBC 8.0 02/05/2023   HGB 13.0 02/05/2023   HCT 39.8 02/05/2023   MCV 87.7 02/05/2023   PLT 72 (L) 02/05/2023   No results found for: "FERRITIN", "IRON", "TIBC", "UIBC", "IRONPCTSAT" Lab Results  Component Value Date   RBC 4.54 02/05/2023   Lab Results  Component Value Date   KPAFRELGTCHN 27.0 (H) 02/22/2023   LAMBDASER 297.4 (H) 02/22/2023   KAPLAMBRATIO 0.09 (L) 02/22/2023   Lab Results  Component Value Date   IGGSERUM 970 02/22/2023   IGA 125 02/22/2023   IGMSERUM 107 02/22/2023   Lab Results  Component Value Date   TOTALPROTELP 6.5  02/22/2023   ALBUMINELP 3.7 02/22/2023   A1GS 0.2 02/22/2023   A2GS 0.8 02/22/2023   BETS 0.9 02/22/2023   GAMS 1.0 02/22/2023   MSPIKE Not Observed 02/22/2023   SPEI Comment 02/22/2023     Chemistry      Component Value Date/Time   NA 139 02/05/2023 1447   K 4.4 02/05/2023 1447   CL 104 02/05/2023 1447   CO2 27 02/05/2023 1447   BUN 23 02/05/2023 1447   CREATININE 0.98 02/05/2023 1447      Component Value Date/Time   CALCIUM 9.3 02/05/2023 1447   ALKPHOS 75 02/05/2023 1447   AST 17 02/05/2023 1447   ALT 10 02/05/2023 1447   BILITOT 0.4 02/05/2023 1447       Impression and Plan: Meredith Dominguez is a a very pleasant 72 yo caucasian female with history of type IIA Von Willebrand disease as well as thrombocytopenia.  Platelets remain stable at 85.  VWB panel is pending.  Follow-up in 4 months.   Eileen Stanford, NP 6/10/20241:04 PM

## 2023-05-04 LAB — COAG STUDIES INTERP REPORT

## 2023-05-04 LAB — VON WILLEBRAND PANEL
Coagulation Factor VIII: 103 % (ref 56–140)
Ristocetin Co-factor, Plasma: 19 % — ABNORMAL LOW (ref 50–200)
Von Willebrand Antigen, Plasma: 98 % (ref 50–200)

## 2023-06-11 ENCOUNTER — Encounter: Payer: Self-pay | Admitting: Hematology & Oncology

## 2023-06-11 ENCOUNTER — Inpatient Hospital Stay: Payer: Medicare HMO | Attending: Medical Oncology

## 2023-06-11 ENCOUNTER — Inpatient Hospital Stay: Payer: Medicare HMO | Admitting: Hematology & Oncology

## 2023-06-11 VITALS — BP 131/74 | HR 95 | Temp 98.0°F | Resp 18 | Wt 138.0 lb

## 2023-06-11 DIAGNOSIS — D68020 Von Willebrand disease, type 2a: Secondary | ICD-10-CM | POA: Insufficient documentation

## 2023-06-11 DIAGNOSIS — D696 Thrombocytopenia, unspecified: Secondary | ICD-10-CM | POA: Diagnosis not present

## 2023-06-11 DIAGNOSIS — D472 Monoclonal gammopathy: Secondary | ICD-10-CM

## 2023-06-11 DIAGNOSIS — Z79899 Other long term (current) drug therapy: Secondary | ICD-10-CM | POA: Insufficient documentation

## 2023-06-11 DIAGNOSIS — Z882 Allergy status to sulfonamides status: Secondary | ICD-10-CM | POA: Diagnosis not present

## 2023-06-11 DIAGNOSIS — D509 Iron deficiency anemia, unspecified: Secondary | ICD-10-CM

## 2023-06-11 LAB — CMP (CANCER CENTER ONLY)
ALT: 9 U/L (ref 0–44)
AST: 16 U/L (ref 15–41)
Albumin: 4.2 g/dL (ref 3.5–5.0)
Alkaline Phosphatase: 82 U/L (ref 38–126)
Anion gap: 8 (ref 5–15)
BUN: 23 mg/dL (ref 8–23)
CO2: 26 mmol/L (ref 22–32)
Calcium: 9.6 mg/dL (ref 8.9–10.3)
Chloride: 103 mmol/L (ref 98–111)
Creatinine: 1.04 mg/dL — ABNORMAL HIGH (ref 0.44–1.00)
GFR, Estimated: 56 mL/min — ABNORMAL LOW (ref >60)
Glucose, Bld: 88 mg/dL (ref 70–99)
Potassium: 5.2 mmol/L — ABNORMAL HIGH (ref 3.5–5.1)
Sodium: 137 mmol/L (ref 135–145)
Total Bilirubin: 0.5 mg/dL (ref 0.3–1.2)
Total Protein: 7.1 g/dL (ref 6.5–8.1)

## 2023-06-11 LAB — CBC WITH DIFFERENTIAL (CANCER CENTER ONLY)
Abs Immature Granulocytes: 0.01 10*3/uL (ref 0.00–0.07)
Basophils Absolute: 0.1 10*3/uL (ref 0.0–0.1)
Basophils Relative: 1 %
Eosinophils Absolute: 0.2 10*3/uL (ref 0.0–0.5)
Eosinophils Relative: 2 %
HCT: 40.9 % (ref 36.0–46.0)
Hemoglobin: 13.2 g/dL (ref 12.0–15.0)
Immature Granulocytes: 0 %
Lymphocytes Relative: 20 %
Lymphs Abs: 1.6 10*3/uL (ref 0.7–4.0)
MCH: 28.3 pg (ref 26.0–34.0)
MCHC: 32.3 g/dL (ref 30.0–36.0)
MCV: 87.6 fL (ref 80.0–100.0)
Monocytes Absolute: 0.7 10*3/uL (ref 0.1–1.0)
Monocytes Relative: 9 %
Neutro Abs: 5.2 10*3/uL (ref 1.7–7.7)
Neutrophils Relative %: 68 %
Platelet Count: 82 10*3/uL — ABNORMAL LOW (ref 150–400)
RBC: 4.67 MIL/uL (ref 3.87–5.11)
RDW: 14.2 % (ref 11.5–15.5)
WBC Count: 7.7 10*3/uL (ref 4.0–10.5)
nRBC: 0 % (ref 0.0–0.2)

## 2023-06-11 LAB — LACTATE DEHYDROGENASE: LDH: 216 U/L — ABNORMAL HIGH (ref 98–192)

## 2023-06-11 NOTE — Progress Notes (Signed)
She is Hematology and Oncology Follow Up Visit  Meredith Dominguez [id number] 06-Feb-1951 72 y.o. 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  So far, she is doing okay.  She is having some problems with her mouth.  She did have some bleeding.  She has a sore that she keeps biting on the left buccal mucosa.  She discussed the a dentist about this.  When we saw her back in June, her Factor VIII level was 103%.  Her von Willebrand factor was 98%.  Her platelet count today is 82,000.  This been holding relatively stable.  Provide know that she does take some complementary therapies.  She is a Facilities manager in alternative medication.  She says that she has always had an elevated lambda light chain.  She has had no fever.  She has had no obvious change in bowel or bladder habits.  She has had no melena or bright red blood per rectum.  Currently, I would say that her performance status is probably ECOG 1.   Medications:  Allergies as of 06/11/2023       Reactions   Gluten Meal Other (See Comments)   sensitivity   Monosodium Glutamate Other (See Comments)   Hot and feels bad   Other Other (See Comments)   Red places   Sesame Oil Rash   Turns bright, ear and anus open and close.   Tomato Other (See Comments)   Other night shade vegetables like eggplant, potato   Wheat Other (See Comments)   Stomach issues   Sulfa Antibiotics Rash        Medication List        Accurate as of June 11, 2023  2:23 PM. If you have any questions, ask your nurse or doctor.          magnesium 30 MG tablet Take 30 mg by mouth 2 (two) times daily.   Magnesium Citrate 125 MG Caps Take 150 mg by mouth daily at 6 (six) AM.   NONFORMULARY OR COMPOUNDED ITEM Thyroid support    NONFORMULARY OR COMPOUNDED ITEM Adrenal by Venko   NONFORMULARY OR COMPOUNDED ITEM Digestin   NONFORMULARY OR COMPOUNDED ITEM Gluten dairy digest PRN   NONFORMULARY OR COMPOUNDED ITEM Cyruta Plus 3/daily   NUTRA-SUPPORT BONE PO Take 3 tablets by mouth daily at 6 (six) AM.   vitamin B-12 500 MCG tablet Commonly known as: CYANOCOBALAMIN Take 500 mcg by mouth daily.        Allergies:  Allergies  Allergen Reactions   Gluten Meal Other (See Comments)    sensitivity   Monosodium Glutamate Other (See Comments)    Hot and feels bad   Other Other (See Comments)    Red places   Sesame Oil Rash    Turns bright, ear and anus open and close.   Tomato Other (See Comments)    Other night shade vegetables like eggplant, potato   Wheat Other (See Comments)    Stomach issues   Sulfa Antibiotics Rash    Past Medical History, Surgical history, Social history, and Family History were reviewed and updated.  Review of Systems: All other 10 point review of systems is negative.   Physical Exam:  weight is 138 lb (62.6 kg). Her oral temperature is 98 F (  36.7 C). Her blood pressure is 131/74 and her pulse is 95. Her respiration is 18 and oxygen saturation is 100%.   Wt Readings from Last 3 Encounters:  06/11/23 138 lb (62.6 kg)  05/03/23 136 lb 0.6 oz (61.7 kg)  02/05/23 139 lb 1.3 oz (63.1 kg)   Physical Exam Vitals reviewed.  HENT:     Head: Normocephalic and atraumatic.     Mouth/Throat:     Comments: Examination of her oral cavity does show some slight bleeding from the left buccal mucosa.  This is along the mandibular aspect of her dentition.  There is no active bleeding.  There is seems to be some clotted blood. Eyes:     Pupils: Pupils are equal, round, and reactive to light.  Cardiovascular:     Rate and Rhythm: Normal rate and regular rhythm.     Heart sounds: Normal heart sounds.  Pulmonary:     Effort: Pulmonary effort is normal.     Breath sounds: Normal breath  sounds.  Abdominal:     General: Bowel sounds are normal.     Palpations: Abdomen is soft.  Musculoskeletal:        General: No tenderness or deformity. Normal range of motion.     Cervical back: Normal range of motion.  Lymphadenopathy:     Cervical: No cervical adenopathy.  Skin:    General: Skin is warm and dry.     Findings: No erythema or rash.  Neurological:     Mental Status: She is alert and oriented to person, place, and time.  Psychiatric:        Behavior: Behavior normal.        Thought Content: Thought content normal.        Judgment: Judgment normal.       Lab Results  Component Value Date   WBC 7.7 06/11/2023   HGB 13.2 06/11/2023   HCT 40.9 06/11/2023   MCV 87.6 06/11/2023   PLT 82 (L) 06/11/2023   No results found for: "FERRITIN", "IRON", "TIBC", "UIBC", "IRONPCTSAT" Lab Results  Component Value Date   RBC 4.67 06/11/2023   Lab Results  Component Value Date   KPAFRELGTCHN 27.0 (H) 02/22/2023   LAMBDASER 297.4 (H) 02/22/2023   KAPLAMBRATIO 0.09 (L) 02/22/2023   Lab Results  Component Value Date   IGGSERUM 970 02/22/2023   IGA 125 02/22/2023   IGMSERUM 107 02/22/2023   Lab Results  Component Value Date   TOTALPROTELP 6.5 02/22/2023   ALBUMINELP 3.7 02/22/2023   A1GS 0.2 02/22/2023   A2GS 0.8 02/22/2023   BETS 0.9 02/22/2023   GAMS 1.0 02/22/2023   MSPIKE Not Observed 02/22/2023   SPEI Comment 02/22/2023     Chemistry      Component Value Date/Time   NA 137 06/11/2023 1302   K 5.2 (H) 06/11/2023 1302   CL 103 06/11/2023 1302   CO2 26 06/11/2023 1302   BUN 23 06/11/2023 1302   CREATININE 1.04 (H) 06/11/2023 1302      Component Value Date/Time   CALCIUM 9.6 06/11/2023 1302   ALKPHOS 82 06/11/2023 1302   AST 16 06/11/2023 1302   ALT 9 06/11/2023 1302   BILITOT 0.5 06/11/2023 1302       Impression and Plan: Meredith Dominguez is a a very pleasant 72 yo caucasian female with history of type IIA Von Willebrand disease.  Again, her von  Willebrand factors certainly seem to be okay right now.  Is hard to say if the  thrombocytopenia is related to the von Willebrand disease.  Everything looks stable.  We will see what the lambda light chain is.  I know this would be incredibly important.  I would like to see her back in about 2 or 3 months.  We will certainly get her back sooner if there are any problems.  Josph Macho, MD 7/19/20242:23 PM

## 2023-06-13 LAB — IGG, IGA, IGM
IgA: 127 mg/dL (ref 64–422)
IgG (Immunoglobin G), Serum: 1033 mg/dL (ref 586–1602)
IgM (Immunoglobulin M), Srm: 111 mg/dL (ref 26–217)

## 2023-06-14 LAB — KAPPA/LAMBDA LIGHT CHAINS
Kappa free light chain: 26.7 mg/L — ABNORMAL HIGH (ref 3.3–19.4)
Kappa, lambda light chain ratio: 0.07 — ABNORMAL LOW (ref 0.26–1.65)
Lambda free light chains: 363.9 mg/L — ABNORMAL HIGH (ref 5.7–26.3)

## 2023-06-14 LAB — VON WILLEBRAND PANEL
Coagulation Factor VIII: 85 % (ref 56–140)
Ristocetin Co-factor, Plasma: 10 % — ABNORMAL LOW (ref 50–200)
Von Willebrand Antigen, Plasma: 98 % (ref 50–200)

## 2023-06-14 LAB — PROTEIN ELECTROPHORESIS, SERUM
A/G Ratio: 1.2 (ref 0.7–1.7)
Albumin ELP: 3.6 g/dL (ref 2.9–4.4)
Alpha-1-Globulin: 0.3 g/dL (ref 0.0–0.4)
Alpha-2-Globulin: 0.8 g/dL (ref 0.4–1.0)
Beta Globulin: 0.9 g/dL (ref 0.7–1.3)
Gamma Globulin: 1 g/dL (ref 0.4–1.8)
Globulin, Total: 3 g/dL (ref 2.2–3.9)
M-Spike, %: 0.3 g/dL — ABNORMAL HIGH
Total Protein ELP: 6.6 g/dL (ref 6.0–8.5)

## 2023-06-14 LAB — COAG STUDIES INTERP REPORT

## 2023-06-17 ENCOUNTER — Emergency Department (HOSPITAL_BASED_OUTPATIENT_CLINIC_OR_DEPARTMENT_OTHER)
Admission: EM | Admit: 2023-06-17 | Discharge: 2023-06-17 | Disposition: A | Discharge status: Home / Self Care | Payer: Medicare HMO | Attending: Emergency Medicine | Admitting: Emergency Medicine

## 2023-06-17 ENCOUNTER — Other Ambulatory Visit: Payer: Self-pay

## 2023-06-17 ENCOUNTER — Ambulatory Visit: Payer: Self-pay

## 2023-06-17 ENCOUNTER — Encounter (HOSPITAL_BASED_OUTPATIENT_CLINIC_OR_DEPARTMENT_OTHER): Payer: Self-pay

## 2023-06-17 DIAGNOSIS — S0990XA Unspecified injury of head, initial encounter: Secondary | ICD-10-CM

## 2023-06-17 DIAGNOSIS — S0103XA Puncture wound without foreign body of scalp, initial encounter: Secondary | ICD-10-CM | POA: Diagnosis not present

## 2023-06-17 DIAGNOSIS — X58XXXA Exposure to other specified factors, initial encounter: Secondary | ICD-10-CM | POA: Diagnosis not present

## 2023-06-17 MED ORDER — LIDOCAINE-EPINEPHRINE (PF) 2 %-1:200000 IJ SOLN
20.0000 mL | Freq: Once | INTRAMUSCULAR | Status: AC
  Administered 2023-06-17: 20 mL
  Filled 2023-06-17: qty 20

## 2023-06-17 NOTE — ED Triage Notes (Addendum)
Pt arrives with c/o head injury. Pt had acupuncture today on her head and then had her hair washed. Per pt, the bleeding started after she had her hair washed. Bleeding controlled at this time. Pt has hx of Von Willebrand.

## 2023-06-17 NOTE — ED Provider Notes (Signed)
[facility] Provider Note   CSN: [id number] Arrival date & time: 06/17/23  1907     History  Chief Complaint  Patient presents with   Head Injury    Meredith Dominguez is a 72 y.o. female with past medical history of von Willebrand disease who presents to the ED complaining of bleeding from her head.  She states that today she got a session of acupuncture which she does for chronic pain and following this she had her hair shampooed.  After the shampoo, she developed recurrent bleeding from her scalp late this afternoon.  She does not know of any direct injuries apart from the acupuncture to her head.  She has not fallen or hit her head.  States that she typically does have issues with bleeding when she has the wound secondary to her von Willebrand's disease.  The area does keep oozing and causing her hair to clot but no brisk, large areas of bleeding since.  No headache, vision changes, lightheadedness, dizziness, chest pain, shortness of breath, or other complaints.    Home Medications Prior to Admission medications   Medication Sig Start Date End Date Taking? Authorizing Provider  magnesium 30 MG tablet Take 30 mg by mouth 2 (two) times daily.    [provider]  Magnesium Citrate 125 MG CAPS Take 150 mg by mouth daily at 6 (six) AM.    [provider]  Multiple Minerals-Vitamins (NUTRA-SUPPORT BONE PO) Take 3 tablets by mouth daily at 6 (six) AM.    [provider]  NONFORMULARY OR COMPOUNDED ITEM Thyroid support    [provider]  NONFORMULARY OR COMPOUNDED ITEM Adrenal by [facility]    [provider]  NONFORMULARY OR COMPOUNDED ITEM Digestin    [provider]  NONFORMULARY OR COMPOUNDED ITEM Gluten dairy digest PRN    [provider]  NONFORMULARY OR COMPOUNDED ITEM Cyruta Plus 3/daily    [provider]  vitamin B-12 (CYANOCOBALAMIN) 500 MCG tablet Take 500 mcg by mouth  daily.    [provider]      Allergies    Gluten meal, Monosodium glutamate, Other, Sesame oil, Tomato, Wheat, and Sulfa antibiotics    Review of Systems   Review of Systems  All other systems reviewed and are negative.   Physical Exam Updated Vital Signs BP (!) 143/92 (BP Location: Left Arm)   Pulse 86   Temp 97.7 F (36.5 C) (Oral)   Resp 16   Wt 62.6 kg   SpO2 100%   BMI 21.92 kg/m  Physical Exam Vitals and nursing note reviewed.  Constitutional:      General: She is not in acute distress.    Appearance: Normal appearance.  HENT:     Head: Normocephalic.     Comments: Pinpoint area of bleeding approximately 1.5 cm in length to the mid posterior scalp that is oozing blood consistently    Mouth/Throat:     Mouth: Mucous membranes are moist.  Eyes:     Conjunctiva/sclera: Conjunctivae normal.  Cardiovascular:     Rate and Rhythm: Normal rate and regular rhythm.     Heart sounds: No murmur heard. Pulmonary:     Effort: Pulmonary effort is normal.     Breath sounds: Normal breath sounds.  Abdominal:     General: Abdomen is flat.     Palpations: Abdomen is soft.     Tenderness: There is no abdominal tenderness.  Musculoskeletal:  General: Normal range of motion.     Cervical back: Neck supple.  Skin:    General: Skin is warm and dry.     Capillary Refill: Capillary refill takes less than 2 seconds.  Neurological:     General: No focal deficit present.     Mental Status: She is alert and oriented to person, place, and time.  Psychiatric:        Mood and Affect: Mood normal.        Behavior: Behavior normal.     ED Results / Procedures / Treatments   Labs (all labs ordered are listed, but only abnormal results are displayed) Labs Reviewed - No data to display  EKG None  Radiology No results found.  Procedures Procedures    Medications Ordered in ED Medications  lidocaine-EPINEPHrine (XYLOCAINE W/EPI) 2 %-1:200000 (PF) injection  20 mL (20 mLs Other Given 06/17/23 2148)    ED Course/ Medical Decision Making/ A&P                             Medical Decision Making Risk Prescription drug management.   Medical Decision Making:   Meredith Dominguez is a 72 y.o. female who presented to the ED today with scalp bleeding detailed above.    Additional history discussed with patient's family/caregivers.  Patient's presentation is complicated by their history of VWD.  Complete initial physical exam performed, notably the patient was neurologically intact. Area of bleeding to scalp as above.    Reviewed and confirmed nursing documentation for past medical history, family history, social history.    Initial Assessment:   With the patient's presentation, differential diagnosis includes but is not limited to head injury, scalp abrasion, ICH, acute blood loss anemia.   This is most consistent with an acute complicated illness  Initial Plan:  Wound Care Objective evaluation as below reviewed    Final Assessment and Plan:   72 year old female presents to the ED with bleeding from her scalp.  She did receive acupuncture earlier today for chronic pain.  She does this intermittently but last session was about 2 months ago.  No fall or head injury otherwise.  Notes that she started bleeding after having her hair stem.Marland Kitchen  Neurologically intact.  She does have a history of von Willebrand's disease.  Slightly hypertensive but otherwise vital signs reassuring.  On examination, she has an area of bleeding to the mid posterior scalp.  Pressure held using lidocaine-epinephrine soaked gauze.  Improvement in bleeding following this.  Pressure dressing applied.  Pending observation, line removed with resolution of bleeding.  No gross large amounts of bleeding.  Do not suspect an acute blood loss anemia.  Blood pressure has remained stable and patient neurologically intact.  With this, do believe that patient is stable for discharge home.  Instructed  to utilize pressure dressing over the next 24 hours to prevent rebleeding given her history of von Willebrand's disease.  Provided with supplies by nursing staff.  Strict ED return precautions given, all questions answered, and stable for discharge.    Clinical Impression:  1. Puncture wound of scalp without foreign body, initial encounter   2. Injury of head, initial encounter      Discharge           Final Clinical Impression(s) / ED Diagnoses Final diagnoses:  Injury of head, initial encounter  Puncture wound of scalp without foreign body, initial encounter    Rx / DC  Orders ED Discharge Orders     None         Richardson Dopp 06/17/23 2255    Virgina Norfolk, DO 06/17/23 2257

## 2023-06-17 NOTE — ED Notes (Signed)
Wound care provided; found area of bleeding and put gauze soaked in lido/epi on wound to stop bleeding. Pressure bandage applied and Kerlix applied to pressure bandage.

## 2023-06-17 NOTE — Telephone Encounter (Signed)
  Chief Complaint: Bleeding from head after acupuncture and hair washing Symptoms: Bleeding Frequency: Now Pertinent Negatives: Patient denies  Disposition: [x] ED /[x] Urgent Care (no appt availability in office) / [] Appointment(In office/virtual)/ []  [facility]/ [] Home Care/ [] Refused Recommended Disposition /[]  Mobile Bus/ []  Follow-up with PCP Additional Notes: Pt has acupuncture this afternoon and then had her washed. Her scalp started bleeding. Pt has von Willebrand. Pt will go to ED/UC for care.    Reason for Disposition  Sounds like a serious injury to the triager  Answer Assessment - Initial Assessment Questions 1. APPEARANCE of INJURY: "What does the injury look like?"      Bleeding from acupuncture 2. SIZE: "How large is the cut?"      Tiny pin sized 3. BLEEDING: "Is it bleeding now?" If Yes, ask: "Is it difficult to stop?"      Yes - just a bit 4. LOCATION: "Where is the injury located?"      On her head 5. ONSET: "How long ago did the injury occur?"      This afternoon  Protocols used: Skin Injury-A-AH

## 2023-06-17 NOTE — Discharge Instructions (Signed)
We were able to control bleeding to your scalp. Please keep pressure dressings on scalp for ~24 hours to prevent re-bleeding due to your history of von Willebrand's disease.  Follow-up with your PCP for any concerns.  For return of significant bleeding or other new concerning symptoms return to the nearest ED for reevaluation.

## 2023-08-24 ENCOUNTER — Ambulatory Visit: Payer: Medicare HMO | Admitting: Family

## 2023-08-24 ENCOUNTER — Inpatient Hospital Stay: Payer: Medicare HMO

## 2023-08-30 ENCOUNTER — Encounter: Payer: Self-pay | Admitting: Medical Oncology

## 2023-08-30 ENCOUNTER — Inpatient Hospital Stay: Payer: Medicare HMO | Admitting: Medical Oncology

## 2023-08-30 ENCOUNTER — Inpatient Hospital Stay: Payer: Medicare HMO | Attending: Medical Oncology

## 2023-08-30 ENCOUNTER — Encounter: Payer: Self-pay | Admitting: Hematology & Oncology

## 2023-08-30 ENCOUNTER — Other Ambulatory Visit: Payer: Self-pay

## 2023-08-30 VITALS — BP 111/66 | HR 79 | Temp 97.9°F | Resp 17

## 2023-08-30 DIAGNOSIS — D68020 Von Willebrand disease, type 2a: Secondary | ICD-10-CM

## 2023-08-30 DIAGNOSIS — D472 Monoclonal gammopathy: Secondary | ICD-10-CM | POA: Diagnosis not present

## 2023-08-30 DIAGNOSIS — D696 Thrombocytopenia, unspecified: Secondary | ICD-10-CM

## 2023-08-30 DIAGNOSIS — Z882 Allergy status to sulfonamides status: Secondary | ICD-10-CM | POA: Diagnosis not present

## 2023-08-30 DIAGNOSIS — Z79899 Other long term (current) drug therapy: Secondary | ICD-10-CM | POA: Diagnosis not present

## 2023-08-30 DIAGNOSIS — D509 Iron deficiency anemia, unspecified: Secondary | ICD-10-CM | POA: Diagnosis not present

## 2023-08-30 LAB — CBC WITH DIFFERENTIAL (CANCER CENTER ONLY)
Abs Immature Granulocytes: 0.02 10*3/uL (ref 0.00–0.07)
Basophils Absolute: 0 10*3/uL (ref 0.0–0.1)
Basophils Relative: 1 %
Eosinophils Absolute: 0.1 10*3/uL (ref 0.0–0.5)
Eosinophils Relative: 2 %
HCT: 40.6 % (ref 36.0–46.0)
Hemoglobin: 13.4 g/dL (ref 12.0–15.0)
Immature Granulocytes: 0 %
Lymphocytes Relative: 22 %
Lymphs Abs: 1.5 10*3/uL (ref 0.7–4.0)
MCH: 28.9 pg (ref 26.0–34.0)
MCHC: 33 g/dL (ref 30.0–36.0)
MCV: 87.5 fL (ref 80.0–100.0)
Monocytes Absolute: 0.6 10*3/uL (ref 0.1–1.0)
Monocytes Relative: 9 %
Neutro Abs: 4.4 10*3/uL (ref 1.7–7.7)
Neutrophils Relative %: 66 %
Platelet Count: 72 10*3/uL — ABNORMAL LOW (ref 150–400)
RBC: 4.64 MIL/uL (ref 3.87–5.11)
RDW: 13.8 % (ref 11.5–15.5)
WBC Count: 6.6 10*3/uL (ref 4.0–10.5)
nRBC: 0 % (ref 0.0–0.2)

## 2023-08-30 LAB — CMP (CANCER CENTER ONLY)
ALT: 8 U/L (ref 0–44)
AST: 15 U/L (ref 15–41)
Albumin: 3.9 g/dL (ref 3.5–5.0)
Alkaline Phosphatase: 72 U/L (ref 38–126)
Anion gap: 6 (ref 5–15)
BUN: 28 mg/dL — ABNORMAL HIGH (ref 8–23)
CO2: 28 mmol/L (ref 22–32)
Calcium: 9.2 mg/dL (ref 8.9–10.3)
Chloride: 104 mmol/L (ref 98–111)
Creatinine: 0.94 mg/dL (ref 0.44–1.00)
GFR, Estimated: >60 mL/min (ref >60)
Glucose, Bld: 84 mg/dL (ref 70–99)
Potassium: 4.4 mmol/L (ref 3.5–5.1)
Sodium: 138 mmol/L (ref 135–145)
Total Bilirubin: 0.5 mg/dL (ref 0.3–1.2)
Total Protein: 6.9 g/dL (ref 6.5–8.1)

## 2023-08-30 LAB — LACTATE DEHYDROGENASE: LDH: 174 U/L (ref 98–192)

## 2023-08-30 LAB — SAVE SMEAR(SSMR), FOR PROVIDER SLIDE REVIEW

## 2023-08-30 NOTE — Progress Notes (Signed)
She is Hematology and Oncology Follow Up Visit  Meredith Dominguez [id number] Dec 13, 1950 72 y.o. 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  She is due to have a dental procedure done to help with her gumline. She is nervous about potential bleeding with this laser procedure.   When we saw her back in June, her Factor VIII level was 85%.  Her von Willebrand factor was 98%.  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   She says that she has always had an elevated lambda light chain. On review of her labs from July she had an M protien level that was detected for the first time with a value of 0.03. Lambda light chain level was elevated at 363.9 compared to 297.4 previously. QIG were normal.   She has had no fever.  She has had no obvious change in bowel or bladder habits.  She has had no melena or bright red blood per rectum.  Currently, I would say that her performance status is probably ECOG 1.  Wt Readings from Last 3 Encounters:  06/17/23 138 lb (62.6 kg)  06/11/23 138 lb (62.6 kg)  05/03/23 136 lb 0.6 oz (61.7 kg)    Medications:  Allergies as of 08/30/2023       Reactions   Gluten Meal Other (See Comments)   sensitivity   Monosodium Glutamate Other (See Comments)   Hot and feels bad   Other Other (See Comments)   Red places   Sesame Oil Rash   Turns bright, ear and anus open and close.   Tomato Other (See Comments)   Other night shade vegetables like eggplant, potato   Wheat Other (See Comments)   Stomach issues   Sulfa Antibiotics Rash        Medication List         Accurate as of August 30, 2023  3:03 PM. If you have any questions, ask your nurse or doctor.          magnesium 30 MG tablet Take 30 mg by mouth 2 (two) times daily.   Magnesium Citrate 125 MG Caps Take 150 mg by mouth daily at 6 (six) AM.   NONFORMULARY OR COMPOUNDED ITEM Thyroid support   NONFORMULARY OR COMPOUNDED ITEM Adrenal by Venko   NONFORMULARY OR COMPOUNDED ITEM Digestin   NONFORMULARY OR COMPOUNDED ITEM Gluten dairy digest PRN   NONFORMULARY OR COMPOUNDED ITEM Cyruta Plus 3/daily   NUTRA-SUPPORT BONE PO Take 3 tablets by mouth daily at 6 (six) AM.   vitamin B-12 500 MCG tablet Commonly known as: CYANOCOBALAMIN Take 500 mcg by mouth daily.        Allergies:  Allergies  Allergen Reactions   Gluten Meal Other (See Comments)    sensitivity   Monosodium Glutamate Other (See Comments)    Hot and feels bad   Other Other (See Comments)    Red places   Sesame Oil Rash    Turns bright, ear and anus open and close.   Tomato Other (See Comments)    Other night shade vegetables like eggplant, potato   Wheat Other (  See Comments)    Stomach issues   Sulfa Antibiotics Rash    Past Medical History, Surgical history, Social history, and Family History were reviewed and updated.  Review of Systems: All other 10 point review of systems is negative.   Physical Exam:  oral temperature is 97.9 F (36.6 C). Her blood pressure is 111/66 and her pulse is 79. Her respiration is 17 and oxygen saturation is 100%.   Wt Readings from Last 3 Encounters:  06/17/23 138 lb (62.6 kg)  06/11/23 138 lb (62.6 kg)  05/03/23 136 lb 0.6 oz (61.7 kg)   Physical Exam Vitals reviewed.  HENT:     Head: Normocephalic and atraumatic.  Eyes:     Pupils: Pupils are equal, round, and reactive to light.  Cardiovascular:     Rate and Rhythm: Normal rate and regular rhythm.     Heart sounds: Normal heart sounds.  Pulmonary:     Effort: Pulmonary effort is normal.     Breath  sounds: Normal breath sounds.  Abdominal:     General: Bowel sounds are normal.     Palpations: Abdomen is soft.  Musculoskeletal:        General: No tenderness or deformity. Normal range of motion.     Cervical back: Normal range of motion.  Lymphadenopathy:     Cervical: No cervical adenopathy.  Skin:    General: Skin is warm and dry.     Findings: No erythema or rash.  Neurological:     Mental Status: She is alert and oriented to person, place, and time.  Psychiatric:        Behavior: Behavior normal.        Thought Content: Thought content normal.        Judgment: Judgment normal.       Lab Results  Component Value Date   WBC 6.6 08/30/2023   HGB 13.4 08/30/2023   HCT 40.6 08/30/2023   MCV 87.5 08/30/2023   PLT 72 (L) 08/30/2023   No results found for: "FERRITIN", "IRON", "TIBC", "UIBC", "IRONPCTSAT" Lab Results  Component Value Date   RBC 4.64 08/30/2023   Lab Results  Component Value Date   KPAFRELGTCHN 26.7 (H) 06/11/2023   LAMBDASER 363.9 (H) 06/11/2023   KAPLAMBRATIO 0.07 (L) 06/11/2023   Lab Results  Component Value Date   IGGSERUM 1,033 06/11/2023   IGA 127 06/11/2023   IGMSERUM 111 06/11/2023   Lab Results  Component Value Date   TOTALPROTELP 6.6 06/11/2023   ALBUMINELP 3.6 06/11/2023   A1GS 0.3 06/11/2023   A2GS 0.8 06/11/2023   BETS 0.9 06/11/2023   GAMS 1.0 06/11/2023   MSPIKE 0.3 (H) 06/11/2023   SPEI Comment 06/11/2023     Chemistry      Component Value Date/Time   NA 138 08/30/2023 1109   K 4.4 08/30/2023 1109   CL 104 08/30/2023 1109   CO2 28 08/30/2023 1109   BUN 28 (H) 08/30/2023 1109   CREATININE 0.94 08/30/2023 1109      Component Value Date/Time   CALCIUM 9.2 08/30/2023 1109   ALKPHOS 72 08/30/2023 1109   AST 15 08/30/2023 1109   ALT 8 08/30/2023 1109   BILITOT 0.5 08/30/2023 1109      Encounter Diagnoses  Name Primary?   Von Willebrand disease, type IIa (HCC) Yes   Iron deficiency anemia, unspecified iron  deficiency anemia type    Monoclonal gammopathy of unknown significance (MGUS)     Impression and Plan: Meredith Dominguez is  a a very pleasant 72 yo caucasian female with history of type IIA Von Willebrand disease. She also has a history of thrombocytopenia and now MGUS.   She has declined a BMB at this time. Labs pending.  Encouraged her to call with information on all of her supplements as we discussed that many of them can cause changes to CBC/CMP values.  Approving her dental procedure- patient wishes to only have 1-2 teeth done at a time due to risk of bleeding.   RTC 3 months MD only, labs 8 days prior   Rushie Chestnut, [place] 10/7/20243:03 PM

## 2023-08-31 LAB — IGG, IGA, IGM
IgA: 133 mg/dL (ref 64–422)
IgG (Immunoglobin G), Serum: 996 mg/dL (ref 586–1602)
IgM (Immunoglobulin M), Srm: 108 mg/dL (ref 26–217)

## 2023-08-31 LAB — KAPPA/LAMBDA LIGHT CHAINS
Kappa free light chain: 21.6 mg/L — ABNORMAL HIGH (ref 3.3–19.4)
Kappa, lambda light chain ratio: 0.06 — ABNORMAL LOW (ref 0.26–1.65)
Lambda free light chains: 361 mg/L — ABNORMAL HIGH (ref 5.7–26.3)

## 2023-09-02 ENCOUNTER — Encounter: Payer: Self-pay | Admitting: Hematology & Oncology

## 2023-09-02 ENCOUNTER — Other Ambulatory Visit: Payer: Self-pay | Admitting: Medical Oncology

## 2023-09-02 ENCOUNTER — Other Ambulatory Visit: Payer: Self-pay

## 2023-09-02 DIAGNOSIS — D696 Thrombocytopenia, unspecified: Secondary | ICD-10-CM

## 2023-09-07 ENCOUNTER — Telehealth: Payer: Self-pay

## 2023-09-07 NOTE — Telephone Encounter (Signed)
Patient called requesting to wait on the Nplate and dentist appointments. Discussed with Clent Jacks who states that is fine, pt to call back if she wants to start them prior to follow up appt. Cancelled injection appts.

## 2023-09-14 ENCOUNTER — Other Ambulatory Visit: Payer: Medicare HMO

## 2023-09-14 ENCOUNTER — Ambulatory Visit: Payer: Medicare HMO

## 2023-09-21 ENCOUNTER — Other Ambulatory Visit: Payer: Medicare HMO

## 2023-09-21 ENCOUNTER — Ambulatory Visit: Payer: Medicare HMO

## 2023-09-28 ENCOUNTER — Other Ambulatory Visit: Payer: Medicare HMO

## 2023-09-28 ENCOUNTER — Ambulatory Visit: Payer: Medicare HMO

## 2023-10-05 ENCOUNTER — Other Ambulatory Visit: Payer: Medicare HMO

## 2023-10-05 ENCOUNTER — Ambulatory Visit: Payer: Medicare HMO

## 2023-11-26 ENCOUNTER — Inpatient Hospital Stay: Payer: Medicare HMO | Attending: Medical Oncology

## 2023-11-26 DIAGNOSIS — Z882 Allergy status to sulfonamides status: Secondary | ICD-10-CM | POA: Insufficient documentation

## 2023-11-26 DIAGNOSIS — D68020 Von Willebrand disease, type 2a: Secondary | ICD-10-CM | POA: Insufficient documentation

## 2023-11-26 DIAGNOSIS — D696 Thrombocytopenia, unspecified: Secondary | ICD-10-CM | POA: Insufficient documentation

## 2023-11-26 DIAGNOSIS — Z79899 Other long term (current) drug therapy: Secondary | ICD-10-CM | POA: Diagnosis not present

## 2023-11-26 DIAGNOSIS — D472 Monoclonal gammopathy: Secondary | ICD-10-CM | POA: Insufficient documentation

## 2023-11-26 LAB — CBC WITH DIFFERENTIAL (CANCER CENTER ONLY)
Abs Immature Granulocytes: 0.03 10*3/uL (ref 0.00–0.07)
Basophils Absolute: 0.1 10*3/uL (ref 0.0–0.1)
Basophils Relative: 1 %
Eosinophils Absolute: 0.2 10*3/uL (ref 0.0–0.5)
Eosinophils Relative: 3 %
HCT: 40.6 % (ref 36.0–46.0)
Hemoglobin: 13.5 g/dL (ref 12.0–15.0)
Immature Granulocytes: 0 %
Lymphocytes Relative: 20 %
Lymphs Abs: 1.7 10*3/uL (ref 0.7–4.0)
MCH: 29.2 pg (ref 26.0–34.0)
MCHC: 33.3 g/dL (ref 30.0–36.0)
MCV: 87.9 fL (ref 80.0–100.0)
Monocytes Absolute: 0.7 10*3/uL (ref 0.1–1.0)
Monocytes Relative: 8 %
Neutro Abs: 5.8 10*3/uL (ref 1.7–7.7)
Neutrophils Relative %: 68 %
Platelet Count: 77 10*3/uL — ABNORMAL LOW (ref 150–400)
RBC: 4.62 MIL/uL (ref 3.87–5.11)
RDW: 13.3 % (ref 11.5–15.5)
WBC Count: 8.5 10*3/uL (ref 4.0–10.5)
nRBC: 0 % (ref 0.0–0.2)

## 2023-12-03 ENCOUNTER — Encounter: Payer: Self-pay | Admitting: Hematology & Oncology

## 2023-12-03 ENCOUNTER — Inpatient Hospital Stay: Payer: Medicare HMO | Admitting: Hematology & Oncology

## 2023-12-03 ENCOUNTER — Inpatient Hospital Stay (HOSPITAL_BASED_OUTPATIENT_CLINIC_OR_DEPARTMENT_OTHER): Payer: Medicare HMO | Admitting: Hematology & Oncology

## 2023-12-03 VITALS — BP 120/58 | HR 60 | Temp 97.6°F | Resp 20 | Ht 66.0 in | Wt 137.0 lb

## 2023-12-03 DIAGNOSIS — D696 Thrombocytopenia, unspecified: Secondary | ICD-10-CM | POA: Diagnosis not present

## 2023-12-03 DIAGNOSIS — D68020 Von Willebrand disease, type 2a: Secondary | ICD-10-CM | POA: Diagnosis not present

## 2023-12-03 NOTE — Progress Notes (Signed)
 She is Hematology and Oncology Follow Up Visit  Meredith Dominguez [id number] 26-Nov-1950 73 y.o. 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  She was last seen back in October.  Since then, she has been doing okay.  She does take quite a few supplements.  She, I do not think, has had any procedures done.  I think she was supposed  to have some dental work done but I am unsure this was completed.  When we last saw her, her lambda light chain was elevated with stable at 36.1 mg/dL.   She has had no fever.  She has had no obvious bleeding.  Again, she takes quite a few supplements.  She has had no rashes.  She has had a little bit of leg swelling but again this appears to be a little bit better.  Currently, I would have said that her performance status is probably ECOG 1.    Wt Readings from Last 3 Encounters:  12/03/23 137 lb (62.1 kg)  06/17/23 138 lb (62.6 kg)  06/11/23 138 lb (62.6 kg)    Medications:  Allergies as of 12/03/2023       Reactions   Gluten Meal Other (See Comments)   sensitivity   Monosodium Glutamate Other (See Comments)   Hot and feels bad   Other Other (See Comments)   Red places   Sesame Oil Rash   Turns bright, ear and anus open and close.   Tomato Other (See Comments)   Other night shade vegetables like eggplant, potato   Wheat Other (See Comments)   Stomach issues   Sulfa Antibiotics Rash        Medication List        Accurate as of December 03, 2023 12:40 PM. If you have any questions, ask your nurse or doctor.          STOP taking these medications    diphenhydramine -acetaminophen  25-500 MG Tabs tablet Commonly known as: TYLENOL  PM Stopped by: Denika Krone R Lailani Tool   magnesium  30 MG tablet Stopped by: Danille Oppedisano R Sammie Denner        TAKE these medications    Magnesium  Citrate 125 MG Caps Take 150 mg by mouth daily at 6 (six) AM.   NON FORMULARY Take 9 drops by mouth daily as needed. [place] 200C Bioactive Nutritional   NON FORMULARY Take 9 drops by mouth as needed. Stomachforce A  Bioactive Nutritional   NON FORMULARY Kidney/Bladder by Standard Enzyme   NONFORMULARY OR COMPOUNDED ITEM Thyroid  support   NONFORMULARY OR COMPOUNDED ITEM Adrenal by Venko   NONFORMULARY OR COMPOUNDED ITEM Digestin   NONFORMULARY OR COMPOUNDED ITEM Gluten dairy digest PRN   NONFORMULARY OR COMPOUNDED ITEM Cyruta Plus 3/daily   NUTRA-SUPPORT BONE PO Take 3 tablets by mouth daily at 6 (six) AM.   OVER THE COUNTER MEDICATION HYDROXO B12 WITH FOLLNIC ACID   OVER THE COUNTER MEDICATION daily as needed. bio active Nutritional Immuno fortifier  Add as: unknown   OVER THE COUNTER MEDICATION daily as needed.  bio active Nutritional Stomach force A   OVER THE COUNTER MEDICATION daily as needed.  spleen thymus liquescence   OVER THE COUNTER MEDICATION daily as needed.  Infection EGA   vitamin B-12 500 MCG tablet Commonly known as: CYANOCOBALAMIN  Take 500  mcg by mouth daily. Fortil B12        Allergies:  Allergies  Allergen Reactions   Gluten Meal Other (See Comments)    sensitivity   Monosodium Glutamate Other (See Comments)    Hot and feels bad   Other Other (See Comments)    Red places   Sesame Oil Rash    Turns bright, ear and anus open and close.   Tomato Other (See Comments)    Other night shade vegetables like eggplant, potato   Wheat Other (See Comments)    Stomach issues   Sulfa Antibiotics Rash    Past Medical History, Surgical history, Social history, and Family History were reviewed and updated.  Review of Systems: Review of Systems  Constitutional: Negative.   HENT: Negative.    Eyes: Negative.   Respiratory: Negative.    Cardiovascular: Negative.   Gastrointestinal:  Negative.   Genitourinary: Negative.   Musculoskeletal: Negative.   Skin: Negative.   Neurological: Negative.   Endo/Heme/Allergies: Negative.   Psychiatric/Behavioral: Negative.       Physical Exam:  height is 5' 6 (1.676 m) and weight is 137 lb (62.1 kg). Her oral temperature is 97.6 F (36.4 C). Her blood pressure is 120/58 (abnormal) and her pulse is 60. Her respiration is 20 and oxygen saturation is 100%.   Wt Readings from Last 3 Encounters:  12/03/23 137 lb (62.1 kg)  06/17/23 138 lb (62.6 kg)  06/11/23 138 lb (62.6 kg)   Physical Exam Vitals reviewed.  HENT:     Head: Normocephalic and atraumatic.  Eyes:     Pupils: Pupils are equal, round, and reactive to light.  Cardiovascular:     Rate and Rhythm: Normal rate and regular rhythm.     Heart sounds: Normal heart sounds.  Pulmonary:     Effort: Pulmonary effort is normal.     Breath sounds: Normal breath sounds.  Abdominal:     General: Bowel sounds are normal.     Palpations: Abdomen is soft.  Musculoskeletal:        General: No tenderness or deformity. Normal range of motion.     Cervical back: Normal range of motion.  Lymphadenopathy:     Cervical: No cervical adenopathy.  Skin:    General: Skin is warm and dry.     Findings: No erythema or rash.  Neurological:     Mental Status: She is alert and oriented to person, place, and time.  Psychiatric:        Behavior: Behavior normal.        Thought Content: Thought content normal.        Judgment: Judgment normal.      Lab Results  Component Value Date   WBC 8.5 11/26/2023   HGB 13.5 11/26/2023   HCT 40.6 11/26/2023   MCV 87.9 11/26/2023   PLT 77 (L) 11/26/2023   No results found for: FERRITIN, IRON, TIBC, UIBC, IRONPCTSAT Lab Results  Component Value Date   RBC 4.62 11/26/2023   Lab Results  Component Value Date   KPAFRELGTCHN 21.6 (H) 08/30/2023   LAMBDASER 361.0 (H) 08/30/2023   KAPLAMBRATIO 0.06 (L) 08/30/2023   Lab Results   Component Value Date   IGGSERUM 996 08/30/2023   IGA 133 08/30/2023   IGMSERUM 108 08/30/2023   Lab Results  Component Value Date   TOTALPROTELP 6.6 06/11/2023   ALBUMINELP 3.6 06/11/2023   A1GS 0.3 06/11/2023   A2GS 0.8 06/11/2023   BETS 0.9 06/11/2023  GAMS 1.0 06/11/2023   MSPIKE 0.3 (H) 06/11/2023   SPEI Comment 06/11/2023     Chemistry      Component Value Date/Time   NA 138 08/30/2023 1109   K 4.4 08/30/2023 1109   CL 104 08/30/2023 1109   CO2 28 08/30/2023 1109   BUN 28 (H) 08/30/2023 1109   CREATININE 0.94 08/30/2023 1109      Component Value Date/Time   CALCIUM  9.2 08/30/2023 1109   ALKPHOS 72 08/30/2023 1109   AST 15 08/30/2023 1109   ALT 8 08/30/2023 1109   BILITOT 0.5 08/30/2023 1109        Impression and Plan: Meredith Dominguez is a a very pleasant 73 yo caucasian female with history of type IIA Von Willebrand disease. She also has a history of thrombocytopenia and elevated lambda light chain.  This concerning be a MGUS.     Overall, I think she is doing quite well.  Despite the abnormalities with her labs, she is totally asymptomatic.  We will try to get her through the Winter now.  I think this would be reasonable.  We really need to have lab work done on her.  She will bring in a 24-hour urine.  When she brings this in, we can do lab work at that time.   Maude JONELLE Crease, MD 1/10/202512:40 PM

## 2023-12-24 ENCOUNTER — Encounter: Payer: Self-pay | Admitting: Hematology & Oncology

## 2023-12-28 ENCOUNTER — Encounter: Payer: Self-pay | Admitting: Hematology & Oncology

## 2024-01-04 ENCOUNTER — Inpatient Hospital Stay: Payer: Medicare PPO | Admitting: Family

## 2024-01-04 ENCOUNTER — Encounter: Payer: Self-pay | Admitting: Family

## 2024-01-04 ENCOUNTER — Inpatient Hospital Stay: Payer: Medicare PPO | Attending: Medical Oncology

## 2024-01-04 VITALS — BP 116/52 | HR 68 | Temp 97.8°F | Resp 18 | Ht 66.0 in | Wt 140.1 lb

## 2024-01-04 DIAGNOSIS — D509 Iron deficiency anemia, unspecified: Secondary | ICD-10-CM

## 2024-01-04 DIAGNOSIS — D472 Monoclonal gammopathy: Secondary | ICD-10-CM

## 2024-01-04 DIAGNOSIS — D696 Thrombocytopenia, unspecified: Secondary | ICD-10-CM | POA: Insufficient documentation

## 2024-01-04 DIAGNOSIS — D68020 Von Willebrand disease, type 2a: Secondary | ICD-10-CM | POA: Diagnosis not present

## 2024-01-04 DIAGNOSIS — Z882 Allergy status to sulfonamides status: Secondary | ICD-10-CM | POA: Insufficient documentation

## 2024-01-04 DIAGNOSIS — D6800 Von Willebrand disease, unspecified: Secondary | ICD-10-CM | POA: Diagnosis not present

## 2024-01-04 DIAGNOSIS — Z79899 Other long term (current) drug therapy: Secondary | ICD-10-CM | POA: Insufficient documentation

## 2024-01-04 LAB — CBC WITH DIFFERENTIAL (CANCER CENTER ONLY)
Abs Immature Granulocytes: 0.02 10*3/uL (ref 0.00–0.07)
Basophils Absolute: 0.1 10*3/uL (ref 0.0–0.1)
Basophils Relative: 1 %
Eosinophils Absolute: 0.2 10*3/uL (ref 0.0–0.5)
Eosinophils Relative: 2 %
HCT: 40.3 % (ref 36.0–46.0)
Hemoglobin: 13.4 g/dL (ref 12.0–15.0)
Immature Granulocytes: 0 %
Lymphocytes Relative: 20 %
Lymphs Abs: 1.6 10*3/uL (ref 0.7–4.0)
MCH: 29 pg (ref 26.0–34.0)
MCHC: 33.3 g/dL (ref 30.0–36.0)
MCV: 87.2 fL (ref 80.0–100.0)
Monocytes Absolute: 0.6 10*3/uL (ref 0.1–1.0)
Monocytes Relative: 8 %
Neutro Abs: 5.5 10*3/uL (ref 1.7–7.7)
Neutrophils Relative %: 69 %
Platelet Count: 78 10*3/uL — ABNORMAL LOW (ref 150–400)
RBC: 4.62 MIL/uL (ref 3.87–5.11)
RDW: 13.7 % (ref 11.5–15.5)
WBC Count: 8 10*3/uL (ref 4.0–10.5)
nRBC: 0 % (ref 0.0–0.2)

## 2024-01-04 LAB — CMP (CANCER CENTER ONLY)
ALT: 9 U/L (ref 0–44)
AST: 15 U/L (ref 15–41)
Albumin: 4.2 g/dL (ref 3.5–5.0)
Alkaline Phosphatase: 74 U/L (ref 38–126)
Anion gap: 7 (ref 5–15)
BUN: 22 mg/dL (ref 8–23)
CO2: 28 mmol/L (ref 22–32)
Calcium: 9.6 mg/dL (ref 8.9–10.3)
Chloride: 102 mmol/L (ref 98–111)
Creatinine: 0.92 mg/dL (ref 0.44–1.00)
GFR, Estimated: >60 mL/min (ref >60)
Glucose, Bld: 78 mg/dL (ref 70–99)
Potassium: 4.5 mmol/L (ref 3.5–5.1)
Sodium: 137 mmol/L (ref 135–145)
Total Bilirubin: 0.5 mg/dL (ref 0.0–1.2)
Total Protein: 6.9 g/dL (ref 6.5–8.1)

## 2024-01-04 LAB — IRON AND IRON BINDING CAPACITY (CC-WL,HP ONLY)
Iron: 74 ug/dL (ref 28–170)
Saturation Ratios: 20 % (ref 10.4–31.8)
TIBC: 378 ug/dL (ref 250–450)
UIBC: 304 ug/dL (ref 148–442)

## 2024-01-04 LAB — FOLATE: Folate: 18.1 ng/mL (ref >5.9)

## 2024-01-04 LAB — LACTATE DEHYDROGENASE: LDH: 173 U/L (ref 98–192)

## 2024-01-04 LAB — VITAMIN B12: Vitamin B-12: 1048 pg/mL — ABNORMAL HIGH (ref 180–914)

## 2024-01-04 NOTE — Progress Notes (Signed)
Hematology and Oncology Follow Up Visit  Meredith Dominguez [id number] 03-Apr-1951 73 y.o. 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 is here today for follow-up. She is doing well over all. Platelets have remained stable at 78.  No issue with blood loss. She has not noted any abnormal bruising, no petechiae.  She plans to bring in her 24 hour urine specimen next week.,  No issues with frequent or recurrent infections. No fever, chills, n/v, cough, rash, dizziness, SOB, chest pain, palpitations, abdominal pain or changes in bowel or bladder habits.  No swelling, numbness or tingling in her extremities.  No falls or syncope reported.  Appetite and hydration are good. Weight is stable at 140 lbs.   ECOG Performance Status: 1 - Symptomatic but completely ambulatory  Medications:  Allergies as of 01/04/2024       Reactions   Gluten Meal Other (See Comments)   sensitivity   Monosodium Glutamate Other (See Comments)   Hot and feels bad   Other Other (See Comments)   Red places   Sesame Oil Rash   Turns bright, ear and anus open and close.   Tomato Other (See Comments)   Other night shade vegetables like eggplant, potato   Wheat Other (See Comments)   Stomach issues   Sulfa Antibiotics Rash        Medication List        Accurate as of January 04, 2024 10:42 AM. If you have any questions, ask your nurse or doctor.          Magnesium Citrate 125 MG Caps Take 150 mg by mouth daily at 6 (six) AM.   NON FORMULARY Take 9 drops by mouth daily as needed. [place] 200C Bioactive Nutritional   NON FORMULARY Take 9 drops by mouth as needed. Stomachforce A  Bioactive Nutritional   NON FORMULARY Kidney/Bladder by Standard Enzyme   NONFORMULARY OR COMPOUNDED ITEM Thyroid support    NONFORMULARY OR COMPOUNDED ITEM Adrenal by Venko   NONFORMULARY OR COMPOUNDED ITEM Digestin   NONFORMULARY OR COMPOUNDED ITEM Gluten dairy digest PRN   NONFORMULARY OR COMPOUNDED ITEM Cyruta Plus 3/daily   NUTRA-SUPPORT BONE PO Take 3 tablets by mouth daily at 6 (six) AM.   OVER THE COUNTER MEDICATION HYDROXO B12 WITH FOLLNIC ACID   OVER THE COUNTER MEDICATION daily as needed. bio active Nutritional Immuno fortifier  Add as: unknown   OVER THE COUNTER MEDICATION daily as needed.  bio active Nutritional Stomach force A   OVER THE COUNTER MEDICATION daily as needed.  spleen thymus liquescence   OVER THE COUNTER MEDICATION daily as needed.  Infection EGA   vitamin B-12 500 MCG tablet Commonly known as: CYANOCOBALAMIN Take 500 mcg by mouth daily. Fortil B12        Allergies:  Allergies  Allergen Reactions   Gluten Meal Other (See Comments)    sensitivity   Monosodium Glutamate Other (See Comments)    Hot and feels bad   Other Other (See Comments)    Red places   Sesame Oil Rash    Turns bright, ear and anus open and close.   Tomato Other (See Comments)    Other night shade vegetables like eggplant, potato   Wheat Other (See Comments)    Stomach issues   Sulfa Antibiotics Rash  Past Medical History, Surgical history, Social history, and Family History were reviewed and updated.  Review of Systems: All other 10 point review of systems is negative.   Physical Exam:  vitals were not taken for this visit.   Wt Readings from Last 3 Encounters:  12/03/23 137 lb (62.1 kg)  06/17/23 138 lb (62.6 kg)  06/11/23 138 lb (62.6 kg)    Ocular: Sclerae unicteric, pupils equal, round and reactive to light Ear-nose-throat: Oropharynx clear, dentition fair Lymphatic: No cervical or supraclavicular adenopathy Lungs no rales or rhonchi, good excursion bilaterally Heart regular rate and rhythm, no murmur appreciated Abd soft, nontender, positive bowel  sounds MSK no focal spinal tenderness, no joint edema Neuro: non-focal, well-oriented, appropriate affect Breasts: Deferred   Lab Results  Component Value Date   WBC 8.0 01/04/2024   HGB 13.4 01/04/2024   HCT 40.3 01/04/2024   MCV 87.2 01/04/2024   PLT 78 (L) 01/04/2024   No results found for: "FERRITIN", "IRON", "TIBC", "UIBC", "IRONPCTSAT" Lab Results  Component Value Date   RBC 4.62 01/04/2024   Lab Results  Component Value Date   KPAFRELGTCHN 21.6 (H) 08/30/2023   LAMBDASER 361.0 (H) 08/30/2023   KAPLAMBRATIO 0.06 (L) 08/30/2023   Lab Results  Component Value Date   IGGSERUM 996 08/30/2023   IGA 133 08/30/2023   IGMSERUM 108 08/30/2023   Lab Results  Component Value Date   TOTALPROTELP 6.6 06/11/2023   ALBUMINELP 3.6 06/11/2023   A1GS 0.3 06/11/2023   A2GS 0.8 06/11/2023   BETS 0.9 06/11/2023   GAMS 1.0 06/11/2023   MSPIKE 0.3 (H) 06/11/2023   SPEI Comment 06/11/2023     Chemistry      Component Value Date/Time   NA 138 08/30/2023 1109   K 4.4 08/30/2023 1109   CL 104 08/30/2023 1109   CO2 28 08/30/2023 1109   BUN 28 (H) 08/30/2023 1109   CREATININE 0.94 08/30/2023 1109      Component Value Date/Time   CALCIUM 9.2 08/30/2023 1109   ALKPHOS 72 08/30/2023 1109   AST 15 08/30/2023 1109   ALT 8 08/30/2023 1109   BILITOT 0.5 08/30/2023 1109       Impression and Plan: Ms. Meredith Dominguez is a a very pleasant 73 yo caucasian female with history of type IIA Von Willebrand disease. 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     Platelets remain stable at 78.  Protein studies and anemia work up pending.  Follow-up in another 3 months.   Eileen Stanford, NP 2/11/202510:42 AM

## 2024-01-05 LAB — KAPPA/LAMBDA LIGHT CHAINS
Kappa free light chain: 30.1 mg/L — ABNORMAL HIGH (ref 3.3–19.4)
Kappa, lambda light chain ratio: 0.08 — ABNORMAL LOW (ref 0.26–1.65)
Lambda free light chains: 398.2 mg/L — ABNORMAL HIGH (ref 5.7–26.3)

## 2024-01-07 ENCOUNTER — Encounter: Payer: Self-pay | Admitting: *Deleted

## 2024-01-07 LAB — MULTIPLE MYELOMA PANEL, SERUM
Albumin SerPl Elph-Mcnc: 3.8 g/dL (ref 2.9–4.4)
Albumin/Glob SerPl: 1.4 (ref 0.7–1.7)
Alpha 1: 0.2 g/dL (ref 0.0–0.4)
Alpha2 Glob SerPl Elph-Mcnc: 0.8 g/dL (ref 0.4–1.0)
B-Globulin SerPl Elph-Mcnc: 0.9 g/dL (ref 0.7–1.3)
Gamma Glob SerPl Elph-Mcnc: 1 g/dL (ref 0.4–1.8)
Globulin, Total: 2.9 g/dL (ref 2.2–3.9)
IgA: 127 mg/dL (ref 64–422)
IgG (Immunoglobin G), Serum: 988 mg/dL (ref 586–1602)
IgM (Immunoglobulin M), Srm: 111 mg/dL (ref 26–217)
Total Protein ELP: 6.7 g/dL (ref 6.0–8.5)

## 2024-01-31 ENCOUNTER — Emergency Department (HOSPITAL_BASED_OUTPATIENT_CLINIC_OR_DEPARTMENT_OTHER)

## 2024-01-31 ENCOUNTER — Encounter: Payer: Self-pay | Admitting: Hematology & Oncology

## 2024-01-31 ENCOUNTER — Other Ambulatory Visit: Payer: Self-pay

## 2024-01-31 ENCOUNTER — Inpatient Hospital Stay (HOSPITAL_BASED_OUTPATIENT_CLINIC_OR_DEPARTMENT_OTHER)
Admission: EM | Admit: 2024-01-31 | Discharge: 2024-02-03 | DRG: 813 | Disposition: A | Discharge status: Home / Self Care | Attending: Student in an Organized Health Care Education/Training Program | Admitting: Student in an Organized Health Care Education/Training Program

## 2024-01-31 ENCOUNTER — Encounter (HOSPITAL_BASED_OUTPATIENT_CLINIC_OR_DEPARTMENT_OTHER): Payer: Self-pay | Admitting: Emergency Medicine

## 2024-01-31 DIAGNOSIS — M810 Age-related osteoporosis without current pathological fracture: Secondary | ICD-10-CM | POA: Diagnosis present

## 2024-01-31 DIAGNOSIS — D68020 Von Willebrand disease, type 2a: Principal | ICD-10-CM

## 2024-01-31 DIAGNOSIS — F419 Anxiety disorder, unspecified: Secondary | ICD-10-CM | POA: Diagnosis present

## 2024-01-31 DIAGNOSIS — T148XXA Other injury of unspecified body region, initial encounter: Principal | ICD-10-CM

## 2024-01-31 DIAGNOSIS — D6959 Other secondary thrombocytopenia: Secondary | ICD-10-CM | POA: Diagnosis present

## 2024-01-31 DIAGNOSIS — Z8249 Family history of ischemic heart disease and other diseases of the circulatory system: Secondary | ICD-10-CM

## 2024-01-31 DIAGNOSIS — D696 Thrombocytopenia, unspecified: Secondary | ICD-10-CM | POA: Diagnosis present

## 2024-01-31 DIAGNOSIS — D62 Acute posthemorrhagic anemia: Secondary | ICD-10-CM

## 2024-01-31 DIAGNOSIS — Z79899 Other long term (current) drug therapy: Secondary | ICD-10-CM

## 2024-01-31 DIAGNOSIS — M7989 Other specified soft tissue disorders: Secondary | ICD-10-CM | POA: Diagnosis not present

## 2024-01-31 DIAGNOSIS — Z66 Do not resuscitate: Secondary | ICD-10-CM | POA: Diagnosis present

## 2024-01-31 DIAGNOSIS — M7981 Nontraumatic hematoma of soft tissue: Principal | ICD-10-CM | POA: Diagnosis present

## 2024-01-31 DIAGNOSIS — K5730 Diverticulosis of large intestine without perforation or abscess without bleeding: Secondary | ICD-10-CM | POA: Diagnosis present

## 2024-01-31 DIAGNOSIS — Z833 Family history of diabetes mellitus: Secondary | ICD-10-CM

## 2024-01-31 DIAGNOSIS — Z882 Allergy status to sulfonamides status: Secondary | ICD-10-CM

## 2024-01-31 DIAGNOSIS — D649 Anemia, unspecified: Secondary | ICD-10-CM

## 2024-01-31 DIAGNOSIS — Z806 Family history of leukemia: Secondary | ICD-10-CM

## 2024-01-31 LAB — CBC WITH DIFFERENTIAL/PLATELET
Abs Immature Granulocytes: 0.06 10*3/uL (ref 0.00–0.07)
Basophils Absolute: 0 10*3/uL (ref 0.0–0.1)
Basophils Relative: 0 %
Eosinophils Absolute: 0.1 10*3/uL (ref 0.0–0.5)
Eosinophils Relative: 1 %
HCT: 27.7 % — ABNORMAL LOW (ref 36.0–46.0)
Hemoglobin: 9.2 g/dL — ABNORMAL LOW (ref 12.0–15.0)
Immature Granulocytes: 1 %
Lymphocytes Relative: 12 %
Lymphs Abs: 1.4 10*3/uL (ref 0.7–4.0)
MCH: 29.1 pg (ref 26.0–34.0)
MCHC: 33.2 g/dL (ref 30.0–36.0)
MCV: 87.7 fL (ref 80.0–100.0)
Monocytes Absolute: 1 10*3/uL (ref 0.1–1.0)
Monocytes Relative: 9 %
Neutro Abs: 9.1 10*3/uL — ABNORMAL HIGH (ref 1.7–7.7)
Neutrophils Relative %: 77 %
Platelets: 93 10*3/uL — ABNORMAL LOW (ref 150–400)
RBC: 3.16 MIL/uL — ABNORMAL LOW (ref 3.87–5.11)
RDW: 14.9 % (ref 11.5–15.5)
WBC: 11.7 10*3/uL — ABNORMAL HIGH (ref 4.0–10.5)
nRBC: 0 % (ref 0.0–0.2)

## 2024-01-31 LAB — COMPREHENSIVE METABOLIC PANEL
ALT: 14 U/L (ref 0–44)
AST: 18 U/L (ref 15–41)
Albumin: 3.6 g/dL (ref 3.5–5.0)
Alkaline Phosphatase: 58 U/L (ref 38–126)
Anion gap: 11 (ref 5–15)
BUN: 28 mg/dL — ABNORMAL HIGH (ref 8–23)
CO2: 22 mmol/L (ref 22–32)
Calcium: 8.7 mg/dL — ABNORMAL LOW (ref 8.9–10.3)
Chloride: 102 mmol/L (ref 98–111)
Creatinine, Ser: 0.85 mg/dL (ref 0.44–1.00)
GFR, Estimated: >60 mL/min (ref >60)
Glucose, Bld: 92 mg/dL (ref 70–99)
Potassium: 4.2 mmol/L (ref 3.5–5.1)
Sodium: 135 mmol/L (ref 135–145)
Total Bilirubin: 1.3 mg/dL — ABNORMAL HIGH (ref 0.0–1.2)
Total Protein: 6.9 g/dL (ref 6.5–8.1)

## 2024-01-31 MED ORDER — IOHEXOL 300 MG/ML  SOLN
100.0000 mL | Freq: Once | INTRAMUSCULAR | Status: AC | PRN
  Administered 2024-01-31: 100 mL via INTRAVENOUS

## 2024-01-31 NOTE — ED Triage Notes (Signed)
 Right leg swelling since last Wednesday. Also endorses left leg swelling, but right is worse. Increased bruising on right leg with history of low platelets and Von Willebrand disease. Denies known injury. Denies thinners.

## 2024-01-31 NOTE — ED Provider Notes (Signed)
 [place] EMERGENCY DEPARTMENT AT [facility] [place] Provider Note   CSN: [id number] Arrival date & time: 01/31/24  1412     History  Chief Complaint  Patient presents with  . Bleeding/Bruising  . Leg Swelling    Meredith Dominguez is a 73 y.o. female.  Patient past history significant for von Willebrand disease, thrombocytopenia presents the ED with concerns of bleeding, bruising, and swelling for the last few days. States that she has had some aching and pain with ambulation.  No prior history of DVT.  Not on blood thinners.  Denies any recent episodes of bleeding gums or nosebleeds.  HPI     Home Medications Prior to Admission medications   Medication Sig Start Date End Date Taking? Authorizing Provider  Magnesium Citrate 125 MG CAPS Take 150 mg by mouth daily at 6 (six) AM.    [provider]  Multiple Minerals-Vitamins (NUTRA-SUPPORT BONE PO) Take 3 tablets by mouth daily at 6 (six) AM.    [provider]  NON FORMULARY Take 9 drops by mouth daily as needed. [place]ie 200C Bioactive Nutritional    [provider]  NON FORMULARY Take 9 drops by mouth as needed. Stomachforce A  Bioactive Nutritional    [provider]  NON FORMULARY Kidney/Bladder by Standard Enzyme    [provider]  NONFORMULARY OR COMPOUNDED ITEM Thyroid support    [provider]  NONFORMULARY OR COMPOUNDED ITEM Adrenal by [facility]    [provider]  NONFORMULARY OR COMPOUNDED ITEM Digestin    [provider]  NONFORMULARY OR COMPOUNDED ITEM Gluten dairy digest PRN    [provider]  NONFORMULARY OR COMPOUNDED ITEM Cyruta Plus 3/daily    [provider]  OVER THE COUNTER MEDICATION HYDROXO B12 WITH FOLLNIC ACID    [provider]  OVER THE COUNTER MEDICATION daily as needed. bio active Nutritional Immuno fortifier  Add as: unknown    [provider]  OVER THE COUNTER MEDICATION daily as needed.   bio active Nutritional Stomach force A    [provider]  OVER THE COUNTER MEDICATION daily as needed.  spleen thymus liquescence    [provider]  OVER THE COUNTER MEDICATION daily as needed.  Infection EGA    [provider]  vitamin B-12 (CYANOCOBALAMIN) 500 MCG tablet Take 500 mcg by mouth daily. Fortil B12    [provider]      Allergies    Gluten meal, Monosodium glutamate, Other, Sesame oil, Tomato, Wheat, and Sulfa antibiotics    Review of Systems   Review of Systems  Cardiovascular:  Positive for leg swelling.  Hematological:  Bruises/bleeds easily.  All other systems reviewed and are negative.   Physical Exam Updated Vital Signs BP 124/71   Pulse 87   Temp 98.6 F (37 C) (Oral)   Resp 18   Ht 5' 6.5" (1.689 m)   Wt 63.6 kg   SpO2 99%   BMI 22.29 kg/m  Physical Exam Vitals and nursing note reviewed.  Constitutional:      General: She is not in acute distress.    Appearance: She is well-developed.  HENT:     Head: Normocephalic and atraumatic.  Eyes:     Conjunctiva/sclera: Conjunctivae normal.  Cardiovascular:     Rate and Rhythm: Normal rate and regular rhythm.     Heart sounds: No murmur heard. Pulmonary:     Effort: Pulmonary effort is normal. No respiratory distress.  Breath sounds: Normal breath sounds.  Abdominal:     Palpations: Abdomen is soft.     Tenderness: There is no abdominal tenderness.  Musculoskeletal:        General: No swelling.     Cervical back: Neck supple.  Skin:    General: Skin is warm and dry.     Capillary Refill: Capillary refill takes less than 2 seconds.     Findings: Bruising present.     Comments: Large bruising present primarily along the lateral and posterior aspects of the right lower leg from low back into foot.  Neurological:     Mental Status: She is alert.  Psychiatric:        Mood and Affect: Mood normal.    ED Results / Procedures / Treatments   Labs (all  labs ordered are listed, but only abnormal results are displayed) Labs Reviewed  CBC WITH DIFFERENTIAL/PLATELET  COMPREHENSIVE METABOLIC PANEL    EKG None  Radiology No results found.  Procedures Procedures    Medications Ordered in ED Medications - No data to display  ED Course/ Medical Decision Making/ A&P                                 Medical Decision Making Amount and/or Complexity of Data Reviewed Labs: ordered.   This patient presents to the ED for concern of leg swelling, leg bruising, swelling.  Differential diagnosis includes DVT, thrombocytopenia, ITP    Additional history obtained:  Additional history obtained from *** External records from outside source obtained and reviewed including ***   Lab Tests:  I Ordered, and personally interpreted labs.  The pertinent results include:  ***   Imaging Studies ordered:  I ordered imaging studies including ***  I independently visualized and interpreted imaging which showed *** I agree with the radiologist interpretation   Medicines ordered and prescription drug management:  I ordered medication including ***  for ***  Reevaluation of the patient after these medicines showed that the patient {resolved/improved/worsened:23923::"improved"} I have reviewed the patients home medicines and have made adjustments as needed   Problem List / ED Course:  ***   Social Determinants of Health:    Final Clinical Impression(s) / ED Diagnoses Final diagnoses:  None    Rx / DC Orders ED Discharge Orders     None

## 2024-01-31 NOTE — ED Provider Notes (Incomplete)
 [place] EMERGENCY DEPARTMENT AT [facility] [place] Provider Note   CSN: [id number] Arrival date & time: 01/31/24  1412     History  Chief Complaint  Patient presents with  . Bleeding/Bruising  . Leg Swelling    Meredith Dominguez is a 73 y.o. female.  Patient past history significant for von Willebrand disease, thrombocytopenia presents the ED with concerns of bleeding, bruising, and swelling for the last few days. States that she has had some aching and pain with ambulation.  No prior history of DVT.  Not on blood thinners.  Denies any recent episodes of bleeding gums or nosebleeds.  No hematemesis, hematochezia, or melanotic stools.  HPI     Home Medications Prior to Admission medications   Medication Sig Start Date End Date Taking? Authorizing Provider  Magnesium Citrate 125 MG CAPS Take 150 mg by mouth daily at 6 (six) AM.    [provider]  Multiple Minerals-Vitamins (NUTRA-SUPPORT BONE PO) Take 3 tablets by mouth daily at 6 (six) AM.    [provider]  NON FORMULARY Take 9 drops by mouth daily as needed. [place] 200C Bioactive Nutritional    [provider]  NON FORMULARY Take 9 drops by mouth as needed. Stomachforce A  Bioactive Nutritional    [provider]  NON FORMULARY Kidney/Bladder by Standard Enzyme    [provider]  NONFORMULARY OR COMPOUNDED ITEM Thyroid support    [provider]  NONFORMULARY OR COMPOUNDED ITEM Adrenal by [facility]    [provider]  NONFORMULARY OR COMPOUNDED ITEM Digestin    [provider]  NONFORMULARY OR COMPOUNDED ITEM Gluten dairy digest PRN    [provider]  NONFORMULARY OR COMPOUNDED ITEM Cyruta Plus 3/daily    [provider]  OVER THE COUNTER MEDICATION HYDROXO B12 WITH FOLLNIC ACID    [provider]  OVER THE COUNTER MEDICATION daily as needed. bio active Nutritional Immuno fortifier  Add as: unknown    [provider]  OVER THE COUNTER MEDICATION daily as needed.  bio active Nutritional Stomach force A    [provider]  OVER THE COUNTER MEDICATION daily as needed.  spleen thymus liquescence    [provider]  OVER THE COUNTER MEDICATION daily as needed.  Infection EGA    [provider]  vitamin B-12 (CYANOCOBALAMIN) 500 MCG tablet Take 500 mcg by mouth daily. Fortil B12    [provider]      Allergies    Gluten meal, Monosodium glutamate, Other, Sesame oil, Tomato, Wheat, and Sulfa antibiotics    Review of Systems   Review of Systems  Cardiovascular:  Positive for leg swelling.  Hematological:  Bruises/bleeds easily.  All other systems reviewed and are negative.   Physical Exam Updated Vital Signs BP (!) 113/52   Pulse 73   Temp 98.6 F (37 C) (Oral)   Resp 19   Ht 5' 6.5" (1.689 m)   Wt 63.6 kg   SpO2 98%   BMI 22.29 kg/m  Physical Exam Vitals and nursing note reviewed.  Constitutional:      General: She is not in acute distress.    Appearance: She is well-developed.  HENT:     Head: Normocephalic and atraumatic.  Eyes:     Conjunctiva/sclera: Conjunctivae normal.  Cardiovascular:     Rate and Rhythm: Normal rate and regular rhythm.     Heart sounds: No murmur heard. Pulmonary:     Effort: Pulmonary effort is  normal. No respiratory distress.     Breath sounds: Normal breath sounds.  Abdominal:     Palpations: Abdomen is soft.     Tenderness: There is no abdominal tenderness.  Musculoskeletal:        General: No swelling.     Cervical back: Neck supple.  Skin:    General: Skin is warm and dry.     Capillary Refill: Capillary refill takes less than 2 seconds.     Findings: Bruising present.     Comments: Large bruising present primarily along the lateral and posterior aspects of the right lower leg from low back into foot. Some appearance of swelling along the right gluteus.  Neurological:     Mental Status: She is alert.   Psychiatric:        Mood and Affect: Mood normal.     ED Results / Procedures / Treatments   Labs (all labs ordered are listed, but only abnormal results are displayed) Labs Reviewed  CBC WITH DIFFERENTIAL/PLATELET - Abnormal; Notable for the following components:      Result Value   WBC 11.7 (*)    RBC 3.16 (*)    Hemoglobin 9.2 (*)    HCT 27.7 (*)    Platelets 93 (*)    Neutro Abs 9.1 (*)    All other components within normal limits  COMPREHENSIVE METABOLIC PANEL - Abnormal; Notable for the following components:   BUN 28 (*)    Calcium 8.7 (*)    Total Bilirubin 1.3 (*)    All other components within normal limits    EKG None  Radiology US Venous Img Lower Right (DVT Study) Result Date: 01/31/2024 CLINICAL DATA:  Right leg swelling and bruising. EXAM: RIGHT LOWER EXTREMITY VENOUS DOPPLER ULTRASOUND TECHNIQUE: Gray-scale sonography with compression, as well as color and duplex ultrasound, were performed to evaluate the deep venous system(s) from the level of the common femoral vein through the popliteal and proximal calf veins. COMPARISON:  None Available. FINDINGS: VENOUS Normal compressibility of the common femoral, superficial femoral, and popliteal veins, as well as the visualized calf veins. Visualized portions of profunda femoral vein and great saphenous vein unremarkable. No filling defects to suggest DVT on grayscale or color Doppler imaging. Doppler waveforms show normal direction of venous flow, normal respiratory plasticity and response to augmentation. Limited views of the contralateral common femoral vein are unremarkable. OTHER Soft tissue edema in the calf. Limitations: none IMPRESSION: No evidence of right lower extremity DVT. Electronically Signed   By: Narda Rutherford M.D.   On: 01/31/2024 22:02    Procedures Procedures    Medications Ordered in ED Medications  iohexol (OMNIPAQUE) 300 MG/ML solution 100 mL (100 mLs Intravenous Contrast Given 01/31/24 2202)     ED Course/ Medical Decision Making/ A&P                                 Medical Decision Making Amount and/or Complexity of Data Reviewed Labs: ordered. Radiology: ordered.  Risk Prescription drug management.   This patient presents to the ED for concern of leg swelling, leg bruising, swelling.  Differential diagnosis includes DVT, thrombocytopenia, ITP  Lab Tests:  I Ordered, and personally interpreted labs.  The pertinent results include: CBC with notable drop in hemoglobin from 13.4-9.2 in 3 weeks.  CMP unremarkable.   Imaging Studies ordered:  I ordered imaging studies including ultrasound of the right lower extremity, CT abdomen pelvis  I independently visualized and interpreted imaging which showed ultrasound negative for DVT, CT abdomen pelvis read pending at time of handoff I agree with the radiologist interpretation   Problem List / ED Course:  Patient presents emergency department with concerns of leg bruising, swelling.  States has been ongoing for the last few days.  Has a history of von Willebrand disease and persistent thrombocytopenia.  Not on blood thinners.  States that she feels some pain and aching in the right leg.  Concerned about area of swelling in the right gluteus that is not normal for her.  Denies recent falls, trauma, or injury.  She does report that the bruising started after she was in some sort of massage chair that was meant to compress the legs.  No prior history of similar onset of symptoms. On exam, there is significant bruising present from the right lower back extending into the gluteus and the anterior and posterior aspects of the right leg in different areas.  No active bleeding at this time.  Will obtain basic labs to assess hemoglobin level.  Ultrasound imaging ordered for assessment of possible DVT as there is some swelling to the right leg. Labs reveal hemoglobin with notable decline from 13.4-9.2 in the last 3 weeks.  CMP unremarkable.   Ultrasound negative for DVT.    Social Determinants of Health:    Final Clinical Impression(s) / ED Diagnoses Final diagnoses:  None    Rx / DC Orders ED Discharge Orders     None

## 2024-02-01 ENCOUNTER — Encounter (HOSPITAL_COMMUNITY): Payer: Self-pay | Admitting: Internal Medicine

## 2024-02-01 DIAGNOSIS — K5730 Diverticulosis of large intestine without perforation or abscess without bleeding: Secondary | ICD-10-CM | POA: Diagnosis present

## 2024-02-01 DIAGNOSIS — D696 Thrombocytopenia, unspecified: Secondary | ICD-10-CM

## 2024-02-01 DIAGNOSIS — D6800 Von Willebrand disease, unspecified: Secondary | ICD-10-CM | POA: Diagnosis not present

## 2024-02-01 DIAGNOSIS — M7981 Nontraumatic hematoma of soft tissue: Secondary | ICD-10-CM | POA: Diagnosis present

## 2024-02-01 DIAGNOSIS — Z79899 Other long term (current) drug therapy: Secondary | ICD-10-CM | POA: Diagnosis not present

## 2024-02-01 DIAGNOSIS — Z66 Do not resuscitate: Secondary | ICD-10-CM | POA: Diagnosis present

## 2024-02-01 DIAGNOSIS — Z8249 Family history of ischemic heart disease and other diseases of the circulatory system: Secondary | ICD-10-CM | POA: Diagnosis not present

## 2024-02-01 DIAGNOSIS — D68020 Von Willebrand disease, type 2a: Secondary | ICD-10-CM | POA: Diagnosis present

## 2024-02-01 DIAGNOSIS — Z806 Family history of leukemia: Secondary | ICD-10-CM | POA: Diagnosis not present

## 2024-02-01 DIAGNOSIS — T148XXA Other injury of unspecified body region, initial encounter: Principal | ICD-10-CM | POA: Diagnosis present

## 2024-02-01 DIAGNOSIS — M7989 Other specified soft tissue disorders: Secondary | ICD-10-CM | POA: Diagnosis present

## 2024-02-01 DIAGNOSIS — S79911A Unspecified injury of right hip, initial encounter: Secondary | ICD-10-CM | POA: Diagnosis not present

## 2024-02-01 DIAGNOSIS — D62 Acute posthemorrhagic anemia: Secondary | ICD-10-CM | POA: Diagnosis present

## 2024-02-01 DIAGNOSIS — M810 Age-related osteoporosis without current pathological fracture: Secondary | ICD-10-CM | POA: Diagnosis present

## 2024-02-01 DIAGNOSIS — F419 Anxiety disorder, unspecified: Secondary | ICD-10-CM | POA: Diagnosis present

## 2024-02-01 DIAGNOSIS — D649 Anemia, unspecified: Secondary | ICD-10-CM | POA: Diagnosis not present

## 2024-02-01 DIAGNOSIS — Z882 Allergy status to sulfonamides status: Secondary | ICD-10-CM | POA: Diagnosis not present

## 2024-02-01 DIAGNOSIS — D6959 Other secondary thrombocytopenia: Secondary | ICD-10-CM | POA: Diagnosis present

## 2024-02-01 DIAGNOSIS — S8991XA Unspecified injury of right lower leg, initial encounter: Secondary | ICD-10-CM | POA: Diagnosis not present

## 2024-02-01 DIAGNOSIS — Z833 Family history of diabetes mellitus: Secondary | ICD-10-CM | POA: Diagnosis not present

## 2024-02-01 LAB — HEMOGLOBIN AND HEMATOCRIT, BLOOD
HCT: 24.4 % — ABNORMAL LOW (ref 36.0–46.0)
Hemoglobin: 8 g/dL — ABNORMAL LOW (ref 12.0–15.0)

## 2024-02-01 LAB — PROTIME-INR
INR: 1.2 (ref 0.8–1.2)
Prothrombin Time: 15.5 s — ABNORMAL HIGH (ref 11.4–15.2)

## 2024-02-01 LAB — APTT: aPTT: 31 s (ref 24–36)

## 2024-02-01 MED ORDER — OXYCODONE HCL 5 MG PO TABS: 5.0000 mg | ORAL_TABLET | ORAL | Status: DC | PRN

## 2024-02-01 MED ORDER — ACETAMINOPHEN 325 MG PO TABS: 650.0000 mg | ORAL_TABLET | Freq: Four times a day (QID) | ORAL | Status: DC | PRN

## 2024-02-01 MED ORDER — ONDANSETRON HCL 4 MG/2ML IJ SOLN: 4.0000 mg | Freq: Four times a day (QID) | INTRAMUSCULAR | Status: DC | PRN

## 2024-02-01 MED ORDER — ONDANSETRON HCL 4 MG PO TABS
4.0000 mg | ORAL_TABLET | Freq: Four times a day (QID) | ORAL | Status: DC | PRN
Start: 2024-02-01 — End: 2024-02-03

## 2024-02-01 MED ORDER — ACETAMINOPHEN 650 MG RE SUPP: 650.0000 mg | Freq: Four times a day (QID) | RECTAL | Status: DC | PRN

## 2024-02-01 NOTE — Assessment & Plan Note (Signed)
 Platelets stable relative to baseline

## 2024-02-01 NOTE — Hospital Course (Signed)
 73 y.o. F with Von Willebrand disease type 2A, chronic thrombocytopenia who presented with right hip and leg swelling.  First noticed swelling and difficulty moving the right leg about 5-6 days ago.  She reported no specific trauma to the right buttock, but several unlikely inciting factors (except that she has been to a spa of some sort in [facility] where she sits in what sounds like a percussive massage chair).  But 5-6 days ago, started to notice swelling of the right hip, difficulty moving the leg, and some ecchymoses.  This progressed until her whole right leg was swollen, so she came to the ER.  In the ER, CT showed a 14cm hematoma around the right greater trochanter, and her Hgb was noted to be 9 g/dL (down from recent baseline 13)  She was admitted for further management of hematoma in setting of VWD and thrombocytopenia.

## 2024-02-01 NOTE — H&P (Signed)
 History and Physical    Patient: Meredith Dominguez WUJ:[id number] DOB: 08/18/1951 DOA: 01/31/2024 DOS: the patient was seen and examined on 02/01/2024 PCP: Melvenia Beam, MD  Patient coming from: Home  Chief Complaint:  Chief Complaint  Patient presents with   Bleeding/Bruising   Leg Swelling       HPI:  73 y.o. F with Von Willebrand disease type 2A, chronic thrombocytopenia who presented with right hip and leg swelling.  First noticed swelling and difficulty moving the right leg about 5-6 days ago.  She reported no specific trauma to the right buttock, but several unlikely inciting factors (except that she has been to a spa of some sort in [facility] where she sits in what sounds like a percussive massage chair).  But 5-6 days ago, started to notice swelling of the right hip, difficulty moving the leg, and some ecchymoses.  This progressed until her whole right leg was swollen, so she came to the ER.  In the ER, CT showed a 14cm hematoma around the right greater trochanter, and her Hgb was noted to be 9 g/dL (down from recent baseline 13)  She was admitted for further management of hematoma in setting of VWD and thrombocytopenia.       Review of Systems  Constitutional:  Negative for chills and fever.  Cardiovascular:  Positive for leg swelling.  Musculoskeletal:  Negative for falls.  Neurological:  Positive for focal weakness. Negative for dizziness and weakness.  Endo/Heme/Allergies:  Bruises/bleeds easily.  All other systems reviewed and are negative.         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  Lives alone.  She  reports that she has never smoked. She has never used smokeless tobacco. She reports current  alcohol use. She reports that she does not use drugs.  Allergies  Allergen Reactions   Monosodium Glutamate Other (See Comments)    Hot and feels badly with fatigue, Flushing   Red Dye #40 (Allura Red) Other (See Comments)    NO DYES!!   Gluten Meal Other (See Comments)    Sensitivity    Other Other (See Comments)    Red places near the collarbone- might be connected to Von Willebrand disease    Sesame Oil Rash    Turns bright, ear and anus open and close.   Tomato Other (See Comments)    And, other night shade vegetables like eggplants   Wheat Other (See Comments)    Stomach issues- can tolerate organic wheat   Sulfa Antibiotics Rash    Family History  Problem Relation Age of Onset   Leukemia Cousin    Cancer Mother    Diabetes Father    Heart disease Father    Bleeding Disorder Neg Hx     Prior to Admission medications   Medication Sig Start Date End Date Taking? Authorizing Provider  CALCIUM LACTATE PO Take 3 tablets by mouth daily as needed (for bleeds).   Yes [provider]  MAGNESIUM CITRATE PO Take 150 mg by mouth daily.   Yes [provider]  NON FORMULARY Take 9 drops by mouth See admin instructions. Coxsackie 200C Bioactive Nutritional- Place 9 drops in the mouth a day as needed  for heart health   Yes [provider]  NON FORMULARY Take 9 drops by mouth See admin instructions. Stomachforce A  Bioactive Nutritional - Place 9 drops in the mouth once a day as needed for amoebas or parasites   Yes [provider]  NON FORMULARY Take 2,775 mg by mouth See admin instructions. Calcium bone support 925 mg - Take 2,775 mg by mouth once a day   Yes [provider]  NON FORMULARY Take 1 capsule by mouth See admin instructions. For-Til B12 - Take 1 capsule by mouth once a day   Yes [provider]  NONFORMULARY OR COMPOUNDED ITEM Take 1 capsule by mouth See admin instructions. Peak Thyroid support - Take 1 capsule by mouth  once a day   Yes [provider]  NONFORMULARY OR COMPOUNDED ITEM Adrenal by [employer]ko - Take 2 capsules by mouth once a day   Yes [provider]  NONFORMULARY OR COMPOUNDED ITEM See admin instructions. Digestin capsule - Take 1 capsule by mouth 2 times a day after a heavy meal   Yes [provider]  NONFORMULARY OR COMPOUNDED ITEM Take 1-2 capsules by mouth See admin instructions. Gluten dairy digest capsules - Take 1-2 capsules by mouth once a day as needed to tolerate dairy   Yes [provider]  NONFORMULARY OR COMPOUNDED ITEM Take 3 tablets by mouth See admin instructions. Cyruta Plus tablets - Take 3 tablets by mouth three times a day   Yes [provider]  OVER THE COUNTER MEDICATION Take 1 tablet by mouth. HYDROXO B12 WITH FOLINIC ACID - Chew 1 tablet by mouth once a day as needed to combat Von Willebrand disease   Yes [provider]  OVER THE COUNTER MEDICATION Take by mouth See admin instructions. Spleen thymus liquescence-  Take 0.5 dropperful by mouthl twice a day   Yes [provider]  OVER THE COUNTER MEDICATION Take 5-10 drops by mouth See admin instructions. Infection EGA II -  Place 5-10 drops in the mouth three times a day as needed to improve the immune system and combat giardia   Yes [provider]    Physical Exam: Vitals:   02/01/24 0845 02/01/24 1102 02/01/24 1220 02/01/24 1451  BP: 127/74 114/61 (!) 109/54 114/61  Pulse: 82 66 98 88  Resp: 18 18  18   Temp:  98.3 F (36.8 C)  98.4 F (36.9 C)  TempSrc:  Oral  Oral  SpO2: 100% 99% 96% 99%  Weight:      Height:       General appearance: Elderly adult female,  alert and in no distress.  Responds appropriately to questions.  Eye contact, dress and hygiene appropriate. HEENT:  Anicteric, conjunctivae and sclerae normal without injection or icterus, lids and lashes normal.  Visual tracking smooth.  OP moist without lesions, dentition in good repair, lips  normal, reduced auditory acuity   Cor:  RRR, without murmurs, rubs.  JVP normal, no LE edema Resp:  Normal respiratory rate and rhythm.  CTAB without rales or wheezes. Abd:  No TTP or rebound all quadrants.  No masses or organomegaly.   No ascites, distension.No scars.  No striae, dilated veins, rashes, or lesions.  MSK: The right thigh and lateral hip are markedly larger than the left.  The whole right leg has dependent swelling from the draining blood with ecchymosis. Skin: Ecchymosis along whole right leg. cap refill normal, Skin intact without significant rashes or lesions. Neuro:  Speech is fluent.  Naming is grossly intact, patient's recall, both recent and remote, seem within normal limits.  Muscle tone normal, face symmetric  Psych:  Attention span and concentration are within normal limits.  Affect normal.  appropriate thought content and normal rate of speech, thought process linear           Data Reviewed: CBC shows stable chronic thrombocytopenia.  New acute anemia. BMP unremarkable CT abdomen and pelvis personally reviewed, shows large gluteal hematoma extending out around the greater trochanter US doppler RLE without DVT       Assessment and Plan: * Hematoma Large hematoma in right gluteus and around the right greater trochanter.  Significant swelling of right leg dependently.  [place] ruled out DVT in the right.  Literature search shows desmopressin low effectiveness in type 2a VWD, and patient reports being told the same.   No role for evacuation, especially given thrombocytopenia and post-op bleeding risk of VWD.  Thankfully it is not terribly painful and she has good mobility.    Thrombocytopenia (HCC) Platelets stable relative to baseline  Acute blood loss anemia Chief problem is anemia.  Hgb baseline ~13 g/dL.  Was down to 9 g/dL on admission and trended to 8 g/dL overnight. - Trend Hgb - Check iron studies - Consult Oncology re: desmopressin, VWD    Von  Willebrand disease, type IIa [facility])           Advance Care Planning: DNR, other pre-arrest interventions desired, discussed with patient with nursing present  Consults: Contacted Dr. Myna Hidalgo for consultation, pending   Family Communication: None  Severity of Illness: The appropriate patient status for this patient is INPATIENT. Inpatient status is judged to be reasonable and necessary in order to provide the required intensity of service to ensure the patient's safety. The patient's presenting symptoms, physical exam findings, and initial radiographic and laboratory data in the context of their chronic comorbidities is felt to place them at high risk for further clinical deterioration. Furthermore, it is not anticipated that the patient will be medically stable for discharge from the hospital within 2 midnights of admission.   * I certify that at the point of admission it is my clinical judgment that the patient will require inpatient hospital care spanning beyond 2 midnights from the point of admission due to high intensity of service, high risk for further deterioration and high frequency of surveillance required.*  Author: Alberteen Sam, MD 02/01/2024 10:59 PM  For on call review [email or web address].

## 2024-02-01 NOTE — ED Notes (Signed)
 Patient repositioned in bed for comfort.  Bedside commode cleaned and new liner placed.  Patient assisted to plug in phone and hearing aids.

## 2024-02-01 NOTE — Plan of Care (Signed)

## 2024-02-01 NOTE — ED Provider Notes (Addendum)
 CT read as abscess but suspect hematoma given clinical presentation.  Discussed with Dr. Malena Edman of radiology.  There is some room enhancement but this could be organized hematoma.  No evidence of gas or active extravasation.   Vitals remained stable.  Dopplers negative for DVT.  Discussed with Dr. Margo Aye who will admit patient for observation and trending of hemoglobin with likely transfusion.   Glynn Octave, MD 02/01/24 0981    Glynn Octave, MD 02/01/24 [phone]

## 2024-02-01 NOTE — Assessment & Plan Note (Signed)
 Large hematoma in right gluteus and around the right greater trochanter.  Significant swelling of right leg dependently.  [place] ruled out DVT in the right.  Literature search shows desmopressin low effectiveness in type 2a VWD, and patient reports being told the same.   No role for evacuation, especially given thrombocytopenia and post-op bleeding risk of VWD.  Thankfully it is not terribly painful and she has good mobility.

## 2024-02-01 NOTE — Assessment & Plan Note (Signed)
 Chief problem is anemia.  Hgb baseline ~13 g/dL.  Was down to 9 g/dL on admission and trended to 8 g/dL overnight. - Trend Hgb - Check iron studies - Consult Oncology re: desmopressin, VWD

## 2024-02-02 DIAGNOSIS — T148XXA Other injury of unspecified body region, initial encounter: Secondary | ICD-10-CM | POA: Diagnosis not present

## 2024-02-02 DIAGNOSIS — S79911A Unspecified injury of right hip, initial encounter: Secondary | ICD-10-CM

## 2024-02-02 DIAGNOSIS — D62 Acute posthemorrhagic anemia: Secondary | ICD-10-CM | POA: Diagnosis not present

## 2024-02-02 DIAGNOSIS — D6800 Von Willebrand disease, unspecified: Secondary | ICD-10-CM | POA: Diagnosis not present

## 2024-02-02 DIAGNOSIS — D68020 Von Willebrand disease, type 2a: Secondary | ICD-10-CM

## 2024-02-02 DIAGNOSIS — D649 Anemia, unspecified: Secondary | ICD-10-CM

## 2024-02-02 DIAGNOSIS — S8991XA Unspecified injury of right lower leg, initial encounter: Secondary | ICD-10-CM

## 2024-02-02 LAB — BASIC METABOLIC PANEL
Anion gap: 10 (ref 5–15)
BUN: 26 mg/dL — ABNORMAL HIGH (ref 8–23)
CO2: 22 mmol/L (ref 22–32)
Calcium: 8.4 mg/dL — ABNORMAL LOW (ref 8.9–10.3)
Chloride: 104 mmol/L (ref 98–111)
Creatinine, Ser: 0.82 mg/dL (ref 0.44–1.00)
GFR, Estimated: >60 mL/min (ref >60)
Glucose, Bld: 92 mg/dL (ref 70–99)
Potassium: 4.2 mmol/L (ref 3.5–5.1)
Sodium: 136 mmol/L (ref 135–145)

## 2024-02-02 LAB — CBC
HCT: 27.1 % — ABNORMAL LOW (ref 36.0–46.0)
Hemoglobin: 8.4 g/dL — ABNORMAL LOW (ref 12.0–15.0)
MCH: 28.8 pg (ref 26.0–34.0)
MCHC: 31 g/dL (ref 30.0–36.0)
MCV: 92.8 fL (ref 80.0–100.0)
Platelets: 101 10*3/uL — ABNORMAL LOW (ref 150–400)
RBC: 2.92 MIL/uL — ABNORMAL LOW (ref 3.87–5.11)
RDW: 15.2 % (ref 11.5–15.5)
WBC: 7.7 10*3/uL (ref 4.0–10.5)
nRBC: 0 % (ref 0.0–0.2)

## 2024-02-02 LAB — FERRITIN: Ferritin: 97 ng/mL (ref 11–307)

## 2024-02-02 LAB — TYPE AND SCREEN
ABO/RH(D): O POS
Antibody Screen: NEGATIVE

## 2024-02-02 LAB — IRON AND TIBC
Iron: 49 ug/dL (ref 28–170)
Saturation Ratios: 18 % (ref 10.4–31.8)
TIBC: 269 ug/dL (ref 250–450)
UIBC: 220 ug/dL

## 2024-02-02 LAB — ABO/RH: ABO/RH(D): O POS

## 2024-02-02 MED ORDER — ANTIHEMOPHILIC FACTOR-VWF 250-600 UNITS IV SOLR
2565.0000 [IU] | Freq: Three times a day (TID) | INTRAVENOUS | Status: AC
  Administered 2024-02-02 (×2): 2565 [IU] via INTRAVENOUS
  Filled 2024-02-02 (×2): qty 523

## 2024-02-02 MED ORDER — SODIUM CHLORIDE 0.9 % IV SOLN
250.0000 mg | Freq: Once | INTRAVENOUS | Status: AC
  Administered 2024-02-03: 250 mg via INTRAVENOUS
  Filled 2024-02-02: qty 20

## 2024-02-02 MED ORDER — FOLIC ACID 1 MG PO TABS
2.0000 mg | ORAL_TABLET | Freq: Every day | ORAL | Status: DC
  Administered 2024-02-02: 2 mg via ORAL
  Filled 2024-02-02 (×2): qty 2

## 2024-02-02 MED ORDER — MAGNESIUM CITRATE PO SOLN: 0.5000 | Freq: Every day | ORAL | Status: DC | PRN

## 2024-02-02 MED ORDER — SODIUM CHLORIDE 0.9 % IV SOLN: INTRAVENOUS | Status: DC

## 2024-02-02 NOTE — Progress Notes (Signed)
 Patient arrived from 4th floor, [facility], by wheelchair; settled in room; no needs; will continue to monitor

## 2024-02-02 NOTE — Evaluation (Addendum)
 Physical Therapy Brief Evaluation and Discharge Note Patient Details Name: Meredith Dominguez MRN: [id number] DOB: 08/14/1951 Today's Date: 02/02/2024   History of Present Illness  73 yo female admitted with anemia, R LE hematoma-gluteus/surrounding GT. Hx of vonWillebrand disease, thrombocytopenia  Clinical Impression  On eval, pt is Mod Ind with mobility. Ambulated ~250 feet around the unit. Pt has been up and ambulating in her room independently. Pt requested some exercises for R LE. Pt preformed APs, QSs, HSs, Hip Abd, and R knee to chest stretch to tolerance. Attempted supine figure 4 stretch but pt did not feel quite ready yet-encouraged her to attempt again in a few days or so. Instructed pt in slow, gentle ROM exercises and stretch listed above: recommend 10-20 reps as tolerated (5-10 second holds with knee to chest stretch to start). Cautioned pt to cease performing any exercises if they cause increased pain, swelling. No further acute care needs. Pt could potentially f/u with OP PT in a couple of weeks if deficits still present. Will sign off.      PT Assessment    Assistance Needed at Discharge       Equipment Recommendations None recommended by PT  Recommendations for Other Services       Precautions/Restrictions Precautions Precautions: Fall Restrictions Weight Bearing Restrictions Per Provider Order: No        Mobility  Bed Mobility          Transfers Overall transfer level: Modified independent                      Ambulation/Gait Ambulation/Gait assistance: Modified independent (Device/Increase time) Gait Distance (Feet): 250 Feet Assistive device: None        Home Activity Instructions    Stairs            Modified Rankin (Stroke Patients Only)        Balance                          Pertinent Vitals/Pain   Pain Assessment Pain Assessment: Faces Faces Pain Scale: Hurts a little bit Pain Location: R  hip/gluteus Pain Descriptors / Indicators: Tightness, Discomfort Pain Intervention(s): Monitored during session     Home Living   Living Arrangements: Alone       Home Equipment:  (walking stick)        Prior Function        UE/LE Assessment               Communication   Communication Communication: No apparent difficulties     Cognition         General Comments      Exercises Total Joint Exercises Ankle Circles/Pumps: AROM, Both, 10 reps Quad Sets: AROM, Both, 10 reps Heel Slides: AROM, Right, 15 reps Hip ABduction/ADduction: AROM, Right, 15 reps Other Exercises Other Exercises: R knee to chest stretch x 5 reps. Pt attempted supine figure 4 stretch but not quite ready for that-encouraged pt to try again in a few days or so   Assessment/Plan    PT Problem List Decreased strength;Decreased range of motion;Decreased mobility;Pain       PT Visit Diagnosis      No Skilled PT     Co-evaluation                AMPAC 6 Clicks Help needed turning from your back to your side while in a flat bed without using bedrails?: None  Help needed moving from lying on your back to sitting on the side of a flat bed without using bedrails?: None Help needed moving to and from a bed to a chair (including a wheelchair)?: None Help needed standing up from a chair using your arms (e.g., wheelchair or bedside chair)?: None Help needed to walk in hospital room?: None Help needed climbing 3-5 steps with a railing? : None 6 Click Score: 24      End of Session   Activity Tolerance: Patient tolerated treatment well Patient left: in bed;with call bell/phone within reach         Time: 1610-9604 PT Time Calculation (min) (ACUTE ONLY): 36 min  Charges:   PT Evaluation $PT Eval Low Complexity: 1 Low PT Treatments $Therapeutic Exercise: 8-22 mins       Faye Ramsay, PT Acute Rehabilitation  Office: [phone]

## 2024-02-02 NOTE — Progress Notes (Signed)
 PROGRESS NOTE  Meredith Dominguez    DOB: 1951/05/05, 73 y.o.  WUJ:[id number]    Code Status: Do not attempt resuscitation (DNR) PRE-ARREST INTERVENTIONS DESIRED   DOA: 01/31/2024   LOS: 1   Brief hospital course  73 y.o. F with Von Willebrand disease type 2A, chronic thrombocytopenia who presented with right hip and leg swelling.  In the ER, CT showed a 14cm hematoma around the right greater trochanter, and her Hgb was noted to be 9 g/dL (down from recent baseline 13)   She was admitted for further management of hematoma in setting of VWD and thrombocytopenia.  Heme/Oncology consulted.   02/02/24 -stable  Assessment & Plan  Principal Problem:   Hematoma Active Problems:   Von Willebrand disease, type IIa (HCC)   Acute blood loss anemia   Thrombocytopenia (HCC)  Von Willebrand disease, type IIa (HCC) Hematoma- to R hip/sacral area. appears to have stabilized. Hgb 9.2>>8.4 - PT/OT -  follow up labs - heme/onc following - humate-p per their orders - CBC am   Thrombocytopenia (HCC) Platelets stable relative to baseline   Acute blood loss anemia Chief problem is anemia.  Hgb baseline ~13 g/dL.  - Trend Hgb - Check iron studies - Consult Oncology re: desmopressin, VWD    Body mass index is 22.29 kg/m.  VTE ppx: SCDs Start: 02/01/24 1455   Diet:     Diet   Diet regular Fluid consistency: Thin   Consultants: Heme.[email or web address]  Subjective 02/02/24    Pt reports feeling well. Has improvement in movement on her R leg. Wants to see PT.  No other bleeding appreciated   Objective   Vitals:   02/01/24 1220 02/01/24 1451 02/01/24 2310 02/02/24 0301  BP: (!) 109/54 114/61 (!) 112/59 106/67  Pulse: 98 88 73 93  Resp:  18 20 19   Temp:  98.4 F (36.9 C) 98.5 F (36.9 C) 98.2 F (36.8 C)  TempSrc:  Oral Oral Oral  SpO2: 96% 99% 98% 96%  Weight:      Height:        Intake/Output Summary (Last 24 hours) at 02/02/2024 9562 Last data filed at 02/01/2024 2200 Gross per 24 hour   Intake 120 ml  Output --  Net 120 ml   Filed Weights   01/31/24 1440  Weight: 63.6 kg    Physical Exam:  General: awake, alert, NAD HEENT: atraumatic, clear conjunctiva, anicteric sclera, MMM, hearing grossly normal Respiratory: normal respiratory effort. Cardiovascular: quick capillary refill, normal S1/S2, RRR, no JVD, murmurs Nervous: A&O x3. no gross focal neurologic deficits, normal speech Extremities: significant bruising on R LE up to sacral area within pre-drawn borders Psychiatry: normal mood, congruent affect  Labs   I have personally reviewed the following labs and imaging studies CBC    Component Value Date/Time   WBC 7.7 02/02/2024 0434   RBC 2.92 (L) 02/02/2024 0434   HGB 8.4 (L) 02/02/2024 0434   HGB 13.4 01/04/2024 1015   HGB 13.3 07/16/2017 1116   HCT 27.1 (L) 02/02/2024 0434   HCT 39.7 07/16/2017 1116   PLT 101 (L) 02/02/2024 0434   PLT 78 (L) 01/04/2024 1015   PLT 90 Platelet count consistent in citrate (L) 07/16/2017 1116   MCV 92.8 02/02/2024 0434   MCV 89 07/16/2017 1116   MCH 28.8 02/02/2024 0434   MCHC 31.0 02/02/2024 0434   RDW 15.2 02/02/2024 0434   RDW 13.8 07/16/2017 1116   LYMPHSABS 1.4 01/31/2024 1906   LYMPHSABS 2.2 07/16/2017 1116  MONOABS 1.0 01/31/2024 1906   EOSABS 0.1 01/31/2024 1906   EOSABS 0.2 07/16/2017 1116   BASOSABS 0.0 01/31/2024 1906   BASOSABS 0.0 07/16/2017 1116      Latest Ref Rng & Units 02/02/2024    4:34 AM 01/31/2024    7:06 PM 01/04/2024   10:15 AM  BMP  Glucose 70 - 99 mg/dL 92  92  78   BUN 8 - 23 mg/dL 26  28  22    Creatinine 0.44 - 1.00 mg/dL 1.61  0.96  0.45   Sodium 135 - 145 mmol/L 136  135  137   Potassium 3.5 - 5.1 mmol/L 4.2  4.2  4.5   Chloride 98 - 111 mmol/L 104  102  102   CO2 22 - 32 mmol/L 22  22  28    Calcium 8.9 - 10.3 mg/dL 8.4  8.7  9.6     CT ABDOMEN PELVIS W CONTRAST Result Date: 02/01/2024 CLINICAL DATA:  Acute abdominal pain EXAM: CT ABDOMEN AND PELVIS WITH CONTRAST TECHNIQUE:  Multidetector CT imaging of the abdomen and pelvis was performed using the standard protocol following bolus administration of intravenous contrast. RADIATION DOSE REDUCTION: This exam was performed according to the departmental dose-optimization program which includes automated exposure control, adjustment of the mA and/or kV according to patient size and/or use of iterative reconstruction technique. CONTRAST:  OMNIPAQUE IOHEXOL 300 MG/ML  SOLN COMPARISON:  None Available. FINDINGS: Lower chest: No acute abnormality. Hepatobiliary: No focal liver abnormality is seen. No gallstones, gallbladder wall thickening, or biliary dilatation. Pancreas: Unremarkable. No pancreatic ductal dilatation or surrounding inflammatory changes. Spleen: There is a subcentimeter rounded hypodensity in the spleen which may represent a cyst or hemangioma. The spleen is normal in size. Adrenals/Urinary Tract: Peripelvic and cortical cysts are noted in the left kidney measuring up to 18 mm. Otherwise, the adrenal glands, kidneys and bladder are within normal limits. Stomach/Bowel: Stomach is within normal limits. Appendix appears normal. No evidence of bowel wall thickening, distention, or inflammatory changes. Scattered colonic diverticula are present. Vascular/Lymphatic: Aortic atherosclerosis. No enlarged abdominal or pelvic lymph nodes. Reproductive: Uterus and bilateral adnexa are unremarkable. Other: No abdominal wall hernia or abnormality. No abdominopelvic ascites. Musculoskeletal: There is a heterogeneous intramuscular enhancing fluid collection with septations within the right gluteus muscle extending inferiorly adjacent to the posterolateral aspect of the proximal femur. Largest measurement is 3.3 x 9.6 by 14.6 cm in largest AP, transverse and craniocaudad dimensions respectively. No significant hip joint effusion. No soft tissue gas or foreign body. There is also some subcutaneous stranding and edema lateral to the right  hip. No acute fracture or dislocation identified. No cortical erosions or periosteal reaction identified. There is levoconvex curvature of the lumbar spine. IMPRESSION: 1. Heterogeneous/complex intramuscular enhancing fluid collection with septations within the right gluteus muscle extending inferiorly adjacent to the posterolateral aspect of the proximal femur measuring up to 14.6 cm. Findings are concerning for abscess in the appropriate clinical setting. 2. Subcutaneous stranding and edema lateral to the right hip. 3. Colonic diverticulosis without evidence for diverticulitis. 4. Left Bosniak I benign renal cyst measuring 1.8 cm. No follow-up imaging recommended. 5. Aortic atherosclerosis. Aortic Atherosclerosis (ICD10-I70.0). Electronically Signed   By: Darliss Cheney M.D.   On: 02/01/2024 00:24   US Venous Img Lower Right (DVT Study) Result Date: 01/31/2024 CLINICAL DATA:  Right leg swelling and bruising. EXAM: RIGHT LOWER EXTREMITY VENOUS DOPPLER ULTRASOUND TECHNIQUE: Gray-scale sonography with compression, as well as color and duplex ultrasound,  were performed to evaluate the deep venous system(s) from the level of the common femoral vein through the popliteal and proximal calf veins. COMPARISON:  None Available. FINDINGS: VENOUS Normal compressibility of the common femoral, superficial femoral, and popliteal veins, as well as the visualized calf veins. Visualized portions of profunda femoral vein and great saphenous vein unremarkable. No filling defects to suggest DVT on grayscale or color Doppler imaging. Doppler waveforms show normal direction of venous flow, normal respiratory plasticity and response to augmentation. Limited views of the contralateral common femoral vein are unremarkable. OTHER Soft tissue edema in the calf. Limitations: none IMPRESSION: No evidence of right lower extremity DVT. Electronically Signed   By: Narda Rutherford M.D.   On: 01/31/2024 22:02    Disposition Plan &  Communication  Patient status: Inpatient  Admitted From: Home Planned disposition location: Home Anticipated discharge date: 3/14 pending heme/onc clearance   Family Communication: none at bedside    Author: Leeroy Bock, DO [facility] 02/02/2024, 7:18 AM   Available by Epic secure chat 7AM-7PM. If 7PM-7AM, please contact night-coverage.  [facility] contact information found on [email or web address].

## 2024-02-02 NOTE — Evaluation (Signed)
 Occupational Therapy Evaluation/Discharge Patient Details Name: Meredith Dominguez MRN: [id number] DOB: 12-18-1950 Today's Date: 02/02/2024   History of Present Illness   73 yo female admitted with anemia, R LE hematoma-gluteus/surrounding GT. Hx of von Willebrand disease, thrombocytopenia     Clinical Impressions Patient evaluated by Occupational Therapy with no further acute OT needs identified. All education has been completed and the patient has no further questions. Pt is currently able to complete all ADL tasks and functional mobility within room independently. No follow-up Occupational Therapy or equipment needs. OT is signing off. Thank you for this referral.       Functional Status Assessment   Patient has not had a recent decline in their functional status     Equipment Recommendations   None recommended by OT      Precautions/Restrictions   Precautions Precautions: None Recall of Precautions/Restrictions: Intact Restrictions Weight Bearing Restrictions Per Provider Order: No     Mobility Bed Mobility Overal bed mobility: Independent   Transfers Overall transfer level: Independent Equipment used: None       Balance Overall balance assessment: Independent      ADL either performed or assessed with clinical judgement   ADL Overall ADL's : Independent;At baseline        Vision Baseline Vision/History: 1 Wears glasses Ability to See in Adequate Light: 0 Adequate Vision Assessment?: No apparent visual deficits     Perception Perception: Within Functional Limits       Praxis Praxis: WFL       Pertinent Vitals/Pain Pain Assessment Pain Assessment: Faces Faces Pain Scale: No hurt     Extremity/Trunk Assessment Upper Extremity Assessment Upper Extremity Assessment: Overall WFL for tasks assessed   Lower Extremity Assessment Lower Extremity Assessment: Defer to PT evaluation   Cervical / Trunk Assessment Cervical / Trunk Assessment:  Normal   Communication Communication Communication: No apparent difficulties   Cognition Arousal: Alert Behavior During Therapy: WFL for tasks assessed/performed Cognition: No apparent impairments    Following commands: Intact       Cueing  General Comments   Cueing Techniques: Verbal cues  large hematoma noted along lateral region of right leg           Home Living Family/patient expects to be discharged to:: Private residence Living Arrangements: Alone   Type of Home: House Home Access: Stairs to enter Secretary/administrator of Steps: 1 curb Entrance Stairs-Rails: None Home Layout: One level     Bathroom Shower/Tub: Dietitian: Other (comment) (walking stick)          Prior Functioning/Environment Prior Level of Function : Independent/Modified Independent         OT Problem List: Pain        OT Goals(Current goals can be found in the care plan section)   Acute Rehab OT Goals OT Goal Formulation: All assessment and education complete, DC therapy   OT Frequency:   1X visit       AM-PAC OT "6 Clicks" Daily Activity     Outcome Measure Help from another person eating meals?: None Help from another person taking care of personal grooming?: None Help from another person toileting, which includes using toliet, bedpan, or urinal?: None Help from another person bathing (including washing, rinsing, drying)?: None Help from another person to put on and taking off regular upper body clothing?: None Help from another person to put on and taking off regular lower body clothing?: None 6  Click Score: 24   End of Session    Activity Tolerance: Patient tolerated treatment well Patient left: in bed;with call bell/phone within reach  OT Visit Diagnosis: Pain Pain - Right/Left: Right Pain - part of body: Leg                Time: 1120-1135 OT Time Calculation (min): 15 min Charges:  OT General Charges $OT Visit: 1 Visit OT  Evaluation $OT Eval Low Complexity: 1 Low  Limmie Patricia, OTR/L,CBIS  Supplemental OT - [facility] and [facility] Secure Chat Preferred    Kerstyn Coryell, Charisse March 02/02/2024, 2:09 PM

## 2024-02-02 NOTE — Plan of Care (Signed)
 Hematoma remains stable and has reduced slightly in size.  Patient remains stable and 24 hr urine in process.

## 2024-02-02 NOTE — Consult Note (Signed)
 Meredith Dominguez is a very known to me.  She is a very charming 73 year old white female.  She has type IIA von Willebrand disease.  So far, she has done very well with this.  She really has had a very few episodes of any bleeding.  The other issue with her says she has a markedly elevated lambda light chain.  She has not yet allowed [place] to do a bone marrow biopsy on her..  She also has an element of thrombocytopenia.  She said that she was at a party and dancing over the weekend.  She did not fall.  She thought that she may have hit a table or to.  She also has a special bed that may have caused some trauma.  She noted that she had a large bruise going down her right leg on Monday.  She subsequently came to the hospital.  When she was admitted, there was a count of 11.7.  Hemoglobin 9.2.  Platelet count 93,000.  She had a CT scan which showed a large hematoma in the right gluteal area.  This measured 3.3 x 9.6 x 14.6 cm.  This extended to the area of the proximal femur.  She has some diverticulosis.  She was admitted.  She is so far, was not had any von Willebrand factor.  Of note, when she was admitted, her INR was 1.2.  We checked her PTT yesterday and it was 31 seconds.  She does have a low rise to seeing cofactor activity.  She does not remember last time she had any type of bleed.  She does have a lot of anxiety.  She has had no fever.  There has been no bleeding elsewhere.  She has not been taking any aspirin or nonsteroidal.  There has been no change in bowel or bladder habits.  She does see a holistic doctor.  She is on quite a few supplements.    Her vital signs show temperature of 98.2.  Pulse 93.  Blood pressure 116/67.  Her head and exam shows no ocular or oral lesions.  There are no palpable cervical or supraclavicular lymph nodes.  Lungs are clear bilaterally.  Cardiac exam regular rate and rhythm.  I do not hear any murmurs.  Abdomen is soft.  She has decent bowel sounds.  There  is no fluid wave.  There is no palpable hepatosplenomegaly.  Her gluteal exam on the right side does show a large ecchymosis that extends down her thigh basely down to the right lower leg.  She has decreased range of motion of the hip area.  Neurological exam shows no focal neurological deficits.  She does have decent pulses in her distal extremities.  She has good warmth and skin temperature in the right lower extremity.   I think this is a situation where we probably need to give her some HUMATE-P.  I think this will get her rising seeing cofactor activity level up nicely.  I think probably 2 doses would be effective.  I do worry that the light chain elevation is a problem.  I think this could certainly lead to some of the thrombocytopenia.  They could also lead to some bleeding.  While she is in the hospital, I will see how much of an evaluation we can do.  She really needs to have a 24-hour urine done.  We will follow along and help out.  Again, she has a lot of anxiety over this situation.  I tried to reassure  her that we should be able to get any bleeding under control.  It is nice to see that her hemoglobin has not dropped any further.  I know that she will get great care from everybody up on 4 W.   Christin Bach, MD  Fayrene Fearing 1:5

## 2024-02-03 DIAGNOSIS — D649 Anemia, unspecified: Secondary | ICD-10-CM

## 2024-02-03 DIAGNOSIS — T148XXA Other injury of unspecified body region, initial encounter: Secondary | ICD-10-CM

## 2024-02-03 DIAGNOSIS — D62 Acute posthemorrhagic anemia: Secondary | ICD-10-CM | POA: Diagnosis not present

## 2024-02-03 DIAGNOSIS — D68020 Von Willebrand disease, type 2a: Secondary | ICD-10-CM | POA: Diagnosis not present

## 2024-02-03 LAB — VON WILLEBRAND PANEL
Coagulation Factor VIII: 122 % (ref 56–140)
Ristocetin Co-factor, Plasma: 19 % — ABNORMAL LOW (ref 50–200)
Von Willebrand Antigen, Plasma: 145 % (ref 50–200)

## 2024-02-03 LAB — KAPPA/LAMBDA LIGHT CHAINS
Kappa free light chain: 24.8 mg/L — ABNORMAL HIGH (ref 3.3–19.4)
Kappa, lambda light chain ratio: 0.05 — ABNORMAL LOW (ref 0.26–1.65)
Lambda free light chains: 457.1 mg/L — ABNORMAL HIGH (ref 5.7–26.3)

## 2024-02-03 LAB — COMPREHENSIVE METABOLIC PANEL
ALT: 10 U/L (ref 0–44)
AST: 16 U/L (ref 15–41)
Albumin: 3.4 g/dL — ABNORMAL LOW (ref 3.5–5.0)
Alkaline Phosphatase: 51 U/L (ref 38–126)
Anion gap: 8 (ref 5–15)
BUN: 22 mg/dL (ref 8–23)
CO2: 24 mmol/L (ref 22–32)
Calcium: 8.6 mg/dL — ABNORMAL LOW (ref 8.9–10.3)
Chloride: 106 mmol/L (ref 98–111)
Creatinine, Ser: 0.92 mg/dL (ref 0.44–1.00)
GFR, Estimated: >60 mL/min (ref >60)
Glucose, Bld: 92 mg/dL (ref 70–99)
Potassium: 4.4 mmol/L (ref 3.5–5.1)
Sodium: 138 mmol/L (ref 135–145)
Total Bilirubin: 1.3 mg/dL — ABNORMAL HIGH (ref 0.0–1.2)
Total Protein: 6 g/dL — ABNORMAL LOW (ref 6.5–8.1)

## 2024-02-03 LAB — CBC WITH DIFFERENTIAL/PLATELET
Abs Immature Granulocytes: 0.03 10*3/uL (ref 0.00–0.07)
Basophils Absolute: 0 10*3/uL (ref 0.0–0.1)
Basophils Relative: 0 %
Eosinophils Absolute: 0.2 10*3/uL (ref 0.0–0.5)
Eosinophils Relative: 3 %
HCT: 27.8 % — ABNORMAL LOW (ref 36.0–46.0)
Hemoglobin: 8.5 g/dL — ABNORMAL LOW (ref 12.0–15.0)
Immature Granulocytes: 0 %
Lymphocytes Relative: 16 %
Lymphs Abs: 1.3 10*3/uL (ref 0.7–4.0)
MCH: 28.5 pg (ref 26.0–34.0)
MCHC: 30.6 g/dL (ref 30.0–36.0)
MCV: 93.3 fL (ref 80.0–100.0)
Monocytes Absolute: 0.7 10*3/uL (ref 0.1–1.0)
Monocytes Relative: 9 %
Neutro Abs: 5.8 10*3/uL (ref 1.7–7.7)
Neutrophils Relative %: 72 %
Platelets: 73 10*3/uL — ABNORMAL LOW (ref 150–400)
RBC: 2.98 MIL/uL — ABNORMAL LOW (ref 3.87–5.11)
RDW: 15.4 % (ref 11.5–15.5)
WBC: 8.1 10*3/uL (ref 4.0–10.5)
nRBC: 0 % (ref 0.0–0.2)

## 2024-02-03 LAB — COAG STUDIES INTERP REPORT

## 2024-02-03 NOTE — Plan of Care (Signed)

## 2024-02-03 NOTE — Progress Notes (Signed)
 Ms. Shafer was moved out of progressive care.  She now is up on 5 W.  She did well with the Humate-P.  The ecchymoses certainly no worse.  I suspect he will take several weeks before they finally improve.  She does not feel as swollen in the right gluteal area..  She is walking..  She got IV iron this morning.  I think she is also folic acid.  Her labs show white count of 8.1.  Hemoglobin 8.5.  Platelet count 73,000.  I am really not surprised that the platelet count has dropped since we are given her iron.  She has had no fever.  She has had no cough.  There is been no change in bowel or bladder habits.  She is trying to eat although she has a diet regimen that she needs to follow.  I would think that she would be able to go home today or tomorrow.  Again, I do not see any evidence of bleeding right now.  I think what she had is resolving.  I think that the Humate-P will help her.  Her vital signs show temperature 98.4.  Pulse 72.  Blood pressure 107/58.  Her lungs are clear bilaterally.  Cardiac exam regular rate and rhythm.  Abdomen is soft.  She has decent bowel sounds.  There is no fluid wave.  Her buttock area does show the ecchymoses on the right buttock.  There is a little bit of swelling but not as much firmness in the right buttock.  Extremities shows no swelling in the right leg.  She has a areas of ecchymoses.  She has good pulses in her distal extremities.  Neurological exam is nonfocal.  She is doing the 24-hour urine for me.  I think this is incredibly important.  Her light chain results not yet back.  I suspect that we probably will end up having to recommend a bone marrow biopsy for her.  However, this could certainly be done as an outpatient.  I think when she finishes up with her 24-hour urine collection, then she could certainly go home.  I am glad that things are going well for her.  She still has a lot of anxiety over this issue.  I told her that she can certainly exercise.   I probably would not do any heavy weights.  I would not do any squats.  I would recommend water aerobics.  She is grateful for the care that she is gotten from everybody in the hospital.   Christin Bach, MD  Hebrews 12:12

## 2024-02-03 NOTE — Discharge Summary (Signed)
 Physician Discharge Summary  Patient: Meredith Dominguez WUJ:[id number] DOB: 1951/06/28   Code Status: Do not attempt resuscitation (DNR) PRE-ARREST INTERVENTIONS DESIRED Admit date: 01/31/2024 Discharge date: 02/03/2024 Disposition: Home, No home health services recommended PCP: Melvenia Beam, MD  Recommendations for Outpatient Follow-up:  Follow up with PCP within 1-2 weeks Regarding general hospital follow up and preventative care Recommend CBC Follow up with heme/onc  Discharge Diagnoses:  Principal Problem:   Hematoma Active Problems:   Von Willebrand disease, type IIa (HCC)   Acute blood loss anemia   Thrombocytopenia (HCC)   Symptomatic anemia  Brief Hospital Course Summary: 73 y.o. F with Von Willebrand disease type 2A, chronic thrombocytopenia who presented with right hip and leg swelling.   In the ER, CT showed a 14cm hematoma around the right greater trochanter, and her Hgb was noted to be 9 g/dL (down from recent baseline 13)   She was admitted for further management of hematoma in setting of VWD and thrombocytopenia.   Heme/Oncology consulted. She received humate-p x2 as well as iron supplements. Hgb stabilized 8.4>8.5.  Her pain was well managed and her mobility improved close to baseline.  PT evaluated and did not recommend any follow up.  Platelets were relatively stable compared to baseline. 73 on day of discharge.  No growth of hematoma or ecchymosis can be appreciated on exam.   All other chronic conditions were treated with home medications.    Discharge Condition: Good, improved Recommended discharge diet: Regular healthy diet  Consultations: Heme/onc  Procedures/Studies: None   Discharge Instructions     Discharge patient   Complete by: As directed    Discharge disposition: 01-Home or Self Care   Discharge patient date: 02/03/2024      Allergies as of 02/03/2024       Reactions   Monosodium Glutamate Other (See Comments)   Hot and  feels badly with fatigue, Flushing   Red Dye #40 (allura Red) Other (See Comments)   NO DYES!!   Gluten Meal Other (See Comments)   Sensitivity   Other Other (See Comments)   Red places near the collarbone- might be connected to Von Willebrand disease    Sesame Oil Rash   Turns bright, ear and anus open and close.   Tomato Other (See Comments)   And, other night shade vegetables like eggplants   Wheat Other (See Comments)   Stomach issues- can tolerate organic wheat   Sulfa Antibiotics Rash        Medication List     TAKE these medications    CALCIUM LACTATE PO Take 3 tablets by mouth daily as needed (for bleeds).   MAGNESIUM CITRATE PO Take 150 mg by mouth daily.   NON FORMULARY Take 9 drops by mouth See admin instructions. Coxsackie 200C Bioactive Nutritional- Place 9 drops in the mouth a day as needed for heart health   NON FORMULARY Take 9 drops by mouth See admin instructions. Stomachforce A  Bioactive Nutritional - Place 9 drops in the mouth once a day as needed for amoebas or parasites   NON FORMULARY Take 2,775 mg by mouth See admin instructions. Calcium bone support 925 mg - Take 2,775 mg by mouth once a day   NON FORMULARY Take 1 capsule by mouth See admin instructions. For-Til B12 - Take 1 capsule by mouth once a day   NONFORMULARY OR COMPOUNDED ITEM Take 1 capsule by mouth See admin instructions. Peak Thyroid support - Take 1 capsule by mouth once  a day   NONFORMULARY OR COMPOUNDED ITEM Adrenal by Venko - Take 2 capsules by mouth once a day   NONFORMULARY OR COMPOUNDED ITEM See admin instructions. Digestin capsule - Take 1 capsule by mouth 2 times a day after a heavy meal   NONFORMULARY OR COMPOUNDED ITEM Take 1-2 capsules by mouth See admin instructions. Gluten dairy digest capsules - Take 1-2 capsules by mouth once a day as needed to tolerate dairy   NONFORMULARY OR COMPOUNDED ITEM Take 3 tablets by mouth See admin instructions. Cyruta Plus tablets  - Take 3 tablets by mouth three times a day   OVER THE COUNTER MEDICATION Take 1 tablet by mouth. HYDROXO B12 WITH FOLINIC ACID - Chew 1 tablet by mouth once a day as needed to combat Von Willebrand disease   OVER THE COUNTER MEDICATION Take by mouth See admin instructions. Spleen thymus liquescence-  Take 0.5 dropperful by mouthl twice a day   OVER THE COUNTER MEDICATION Take 5-10 drops by mouth See admin instructions. Infection EGA II -  Place 5-10 drops in the mouth three times a day as needed to improve the immune system and combat giardia        Follow-up Information     Clenton Pare., MD Follow up.   Specialty: Internal Medicine        Melvenia Beam, MD .   Specialty: [facility] Contact information: [address]  [address] [place] [address] [phone]                 Subjective   Pt reports feeling well. She is looking forward to getting back to activities. Her pain is tolerable and has improved flexibility from presentation.   All questions and concerns were addressed at time of discharge.  Objective  Blood pressure (!) 107/58, pulse 72, temperature 98.4 F (36.9 C), temperature source Oral, resp. rate 19, height 5' 6.5" (1.689 m), weight 63.6 kg, SpO2 100%.   General: Pt is alert, awake, not in acute distress Cardiovascular: RRR, S1/S2 +, no rubs, no gallops Respiratory: CTA bilaterally, no wheezing, no rhonchi Abdominal: Soft, NT, ND, bowel sounds + Extremities: positive ecchymosis of lower extremities- R worse than left and discoloration remains within the drawn borders. Firm hematoma on posterior R sacral area  The results of significant diagnostics from this hospitalization (including imaging, microbiology, ancillary and laboratory) are listed below for reference.   Imaging studies: CT ABDOMEN PELVIS W CONTRAST Result Date: 02/01/2024 CLINICAL DATA:  Acute abdominal pain EXAM: CT ABDOMEN AND PELVIS WITH CONTRAST  TECHNIQUE: Multidetector CT imaging of the abdomen and pelvis was performed using the standard protocol following bolus administration of intravenous contrast. RADIATION DOSE REDUCTION: This exam was performed according to the departmental dose-optimization program which includes automated exposure control, adjustment of the mA and/or kV according to patient size and/or use of iterative reconstruction technique. CONTRAST:  OMNIPAQUE IOHEXOL 300 MG/ML  SOLN COMPARISON:  None Available. FINDINGS: Lower chest: No acute abnormality. Hepatobiliary: No focal liver abnormality is seen. No gallstones, gallbladder wall thickening, or biliary dilatation. Pancreas: Unremarkable. No pancreatic ductal dilatation or surrounding inflammatory changes. Spleen: There is a subcentimeter rounded hypodensity in the spleen which may represent a cyst or hemangioma. The spleen is normal in size. Adrenals/Urinary Tract: Peripelvic and cortical cysts are noted in the left kidney measuring up to 18 mm. Otherwise, the adrenal glands, kidneys and bladder are within normal limits. Stomach/Bowel: Stomach is within normal limits. Appendix appears normal.  No evidence of bowel wall thickening, distention, or inflammatory changes. Scattered colonic diverticula are present. Vascular/Lymphatic: Aortic atherosclerosis. No enlarged abdominal or pelvic lymph nodes. Reproductive: Uterus and bilateral adnexa are unremarkable. Other: No abdominal wall hernia or abnormality. No abdominopelvic ascites. Musculoskeletal: There is a heterogeneous intramuscular enhancing fluid collection with septations within the right gluteus muscle extending inferiorly adjacent to the posterolateral aspect of the proximal femur. Largest measurement is 3.3 x 9.6 by 14.6 cm in largest AP, transverse and craniocaudad dimensions respectively. No significant hip joint effusion. No soft tissue gas or foreign body. There is also some subcutaneous stranding and edema lateral to  the right hip. No acute fracture or dislocation identified. No cortical erosions or periosteal reaction identified. There is levoconvex curvature of the lumbar spine. IMPRESSION: 1. Heterogeneous/complex intramuscular enhancing fluid collection with septations within the right gluteus muscle extending inferiorly adjacent to the posterolateral aspect of the proximal femur measuring up to 14.6 cm. Findings are concerning for abscess in the appropriate clinical setting. 2. Subcutaneous stranding and edema lateral to the right hip. 3. Colonic diverticulosis without evidence for diverticulitis. 4. Left Bosniak I benign renal cyst measuring 1.8 cm. No follow-up imaging recommended. 5. Aortic atherosclerosis. Aortic Atherosclerosis (ICD10-I70.0). Electronically Signed   By: Darliss Cheney M.D.   On: 02/01/2024 00:24   US Venous Img Lower Right (DVT Study) Result Date: 01/31/2024 CLINICAL DATA:  Right leg swelling and bruising. EXAM: RIGHT LOWER EXTREMITY VENOUS DOPPLER ULTRASOUND TECHNIQUE: Gray-scale sonography with compression, as well as color and duplex ultrasound, were performed to evaluate the deep venous system(s) from the level of the common femoral vein through the popliteal and proximal calf veins. COMPARISON:  None Available. FINDINGS: VENOUS Normal compressibility of the common femoral, superficial femoral, and popliteal veins, as well as the visualized calf veins. Visualized portions of profunda femoral vein and great saphenous vein unremarkable. No filling defects to suggest DVT on grayscale or color Doppler imaging. Doppler waveforms show normal direction of venous flow, normal respiratory plasticity and response to augmentation. Limited views of the contralateral common femoral vein are unremarkable. OTHER Soft tissue edema in the calf. Limitations: none IMPRESSION: No evidence of right lower extremity DVT. Electronically Signed   By: Narda Rutherford M.D.   On: 01/31/2024 22:02    Labs: Basic  Metabolic Panel: Recent Labs  Lab 01/31/24 1906 02/02/24 0434 02/03/24 0433  NA 135 136 138  K 4.2 4.2 4.4  CL 102 104 106  CO2 22 22 24   GLUCOSE 92 92 92  BUN 28* 26* 22  CREATININE 0.85 0.82 0.92  CALCIUM 8.7* 8.4* 8.6*   CBC: Recent Labs  Lab 01/31/24 1906 02/01/24 0639 02/02/24 0434 02/03/24 0433  WBC 11.7*  --  7.7 8.1  NEUTROABS 9.1*  --   --  5.8  HGB 9.2* 8.0* 8.4* 8.5*  HCT 27.7* 24.4* 27.1* 27.8*  MCV 87.7  --  92.8 93.3  PLT 93*  --  101* 73*   Microbiology: Results for orders placed or performed in visit on 12/18/19  Fecal occult blood, imunochemical([facility])     Status: Abnormal   Collection Time: 12/18/19  4:23 PM   Specimen: Stool  Result Value Ref Range Status   Fecal Occult Bld Positive (A) Negative Final    Time coordinating discharge: Over 30 minutes  Leeroy Bock, MD  [facility] 02/03/2024, 9:34 AM

## 2024-02-03 NOTE — Progress Notes (Signed)
   02/03/24 0940  TOC Brief Assessment  Insurance and Status Reviewed  Patient has primary care physician Yes  Home environment has been reviewed home alone  Prior level of function: independent  Prior/Current Home Services No current home services  Social Drivers of Health Review SDOH reviewed no interventions necessary  Readmission risk has been reviewed Yes  Transition of care needs no transition of care needs at this time

## 2024-02-03 NOTE — Discharge Instructions (Addendum)
 Please make a follow up appointment within 1-2 weeks to recheck your blood work. This can be with your PCP or hematologist. They can discuss the results of your urine test at your follow up with them You are safe to resume normal daily activities as well as exercise as long as it does not include lifting weights over 10 pounds, squatting, or activities that will cause impact to your body I have included an iron-rich diet informational which will help assist your body in rebuilding the blood cells that were lost in your hematoma.

## 2024-02-03 NOTE — Plan of Care (Addendum)
 AVS reviewed with pt, all questions answered. Pt belongings packed and sent with pt upon DC. Pt escorted off unit via wheelchair to private vehicle.    Problem: Education: Goal: Knowledge of General Education information will improve Description: Including pain rating scale, medication(s)/side effects and non-pharmacologic comfort measures Outcome: Adequate for Discharge   Problem: Health Behavior/Discharge Planning: Goal: Ability to manage health-related needs will improve Outcome: Adequate for Discharge   Problem: Clinical Measurements: Goal: Ability to maintain clinical measurements within normal limits will improve Outcome: Adequate for Discharge Goal: Will remain free from infection Outcome: Adequate for Discharge Goal: Diagnostic test results will improve Outcome: Adequate for Discharge Goal: Respiratory complications will improve Outcome: Adequate for Discharge Goal: Cardiovascular complication will be avoided Outcome: Adequate for Discharge   Problem: Activity: Goal: Risk for activity intolerance will decrease Outcome: Adequate for Discharge   Problem: Nutrition: Goal: Adequate nutrition will be maintained Outcome: Adequate for Discharge   Problem: Coping: Goal: Level of anxiety will decrease Outcome: Adequate for Discharge   Problem: Elimination: Goal: Will not experience complications related to bowel motility Outcome: Adequate for Discharge Goal: Will not experience complications related to urinary retention Outcome: Adequate for Discharge   Problem: Pain Managment: Goal: General experience of comfort will improve and/or be controlled Outcome: Adequate for Discharge   Problem: Safety: Goal: Ability to remain free from injury will improve Outcome: Adequate for Discharge   Problem: Skin Integrity: Goal: Risk for impaired skin integrity will decrease Outcome: Adequate for Discharge

## 2024-02-06 LAB — MULTIPLE MYELOMA PANEL, SERUM
Albumin SerPl Elph-Mcnc: 3.1 g/dL (ref 2.9–4.4)
Albumin/Glob SerPl: 1.2 (ref 0.7–1.7)
Alpha 1: 0.3 g/dL (ref 0.0–0.4)
Alpha2 Glob SerPl Elph-Mcnc: 0.7 g/dL (ref 0.4–1.0)
B-Globulin SerPl Elph-Mcnc: 0.8 g/dL (ref 0.7–1.3)
Gamma Glob SerPl Elph-Mcnc: 0.9 g/dL (ref 0.4–1.8)
Globulin, Total: 2.7 g/dL (ref 2.2–3.9)
IgA: 111 mg/dL (ref 64–422)
IgG (Immunoglobin G), Serum: 798 mg/dL (ref 586–1602)
IgM (Immunoglobulin M), Srm: 81 mg/dL (ref 26–217)
Total Protein ELP: 5.8 g/dL — ABNORMAL LOW (ref 6.0–8.5)

## 2024-02-08 LAB — UPEP/UIFE/LIGHT CHAINS/TP, 24-HR UR
% BETA, Urine: 0 %
ALPHA 1 URINE: 0 %
Albumin, U: 100 %
Alpha 2, Urine: 0 %
Free Kappa Lt Chains,Ur: 23.25 mg/L (ref 1.17–86.46)
Free Kappa/Lambda Ratio: 4.45 (ref 1.83–14.26)
Free Lambda Lt Chains,Ur: 5.23 mg/L (ref 0.27–15.21)
GAMMA GLOBULIN URINE: 0 %
Total Protein, Urine-Ur/day: 390 mg/(24.h) — ABNORMAL HIGH (ref 30–150)
Total Protein, Urine: 16.8 mg/dL
Total Volume: 2320

## 2024-02-16 ENCOUNTER — Encounter: Payer: Self-pay | Admitting: Hematology & Oncology

## 2024-02-16 ENCOUNTER — Inpatient Hospital Stay: Payer: Self-pay | Attending: Medical Oncology

## 2024-02-16 ENCOUNTER — Inpatient Hospital Stay (HOSPITAL_BASED_OUTPATIENT_CLINIC_OR_DEPARTMENT_OTHER): Admitting: Hematology & Oncology

## 2024-02-16 VITALS — BP 117/58 | HR 64 | Temp 98.1°F | Resp 20 | Ht 66.5 in | Wt 138.0 lb

## 2024-02-16 DIAGNOSIS — D62 Acute posthemorrhagic anemia: Secondary | ICD-10-CM

## 2024-02-16 DIAGNOSIS — Z79899 Other long term (current) drug therapy: Secondary | ICD-10-CM | POA: Diagnosis not present

## 2024-02-16 DIAGNOSIS — E611 Iron deficiency: Secondary | ICD-10-CM | POA: Diagnosis not present

## 2024-02-16 DIAGNOSIS — D509 Iron deficiency anemia, unspecified: Secondary | ICD-10-CM

## 2024-02-16 DIAGNOSIS — D68020 Von Willebrand disease, type 2a: Secondary | ICD-10-CM | POA: Diagnosis not present

## 2024-02-16 DIAGNOSIS — Z882 Allergy status to sulfonamides status: Secondary | ICD-10-CM | POA: Diagnosis not present

## 2024-02-16 DIAGNOSIS — D696 Thrombocytopenia, unspecified: Secondary | ICD-10-CM | POA: Diagnosis not present

## 2024-02-16 DIAGNOSIS — D472 Monoclonal gammopathy: Secondary | ICD-10-CM

## 2024-02-16 LAB — CBC WITH DIFFERENTIAL (CANCER CENTER ONLY)
Abs Immature Granulocytes: 0.05 10*3/uL (ref 0.00–0.07)
Basophils Absolute: 0.1 10*3/uL (ref 0.0–0.1)
Basophils Relative: 1 %
Eosinophils Absolute: 0.2 10*3/uL (ref 0.0–0.5)
Eosinophils Relative: 2 %
HCT: 35.1 % — ABNORMAL LOW (ref 36.0–46.0)
Hemoglobin: 11.2 g/dL — ABNORMAL LOW (ref 12.0–15.0)
Immature Granulocytes: 1 %
Lymphocytes Relative: 16 %
Lymphs Abs: 1.4 10*3/uL (ref 0.7–4.0)
MCH: 29.6 pg (ref 26.0–34.0)
MCHC: 31.9 g/dL (ref 30.0–36.0)
MCV: 92.6 fL (ref 80.0–100.0)
Monocytes Absolute: 0.6 10*3/uL (ref 0.1–1.0)
Monocytes Relative: 7 %
Neutro Abs: 6.4 10*3/uL (ref 1.7–7.7)
Neutrophils Relative %: 73 %
Platelet Count: 98 10*3/uL — ABNORMAL LOW (ref 150–400)
RBC: 3.79 MIL/uL — ABNORMAL LOW (ref 3.87–5.11)
RDW: 16.5 % — ABNORMAL HIGH (ref 11.5–15.5)
WBC Count: 8.6 10*3/uL (ref 4.0–10.5)
nRBC: 0 % (ref 0.0–0.2)

## 2024-02-16 LAB — CMP (CANCER CENTER ONLY)
ALT: 8 U/L (ref 0–44)
AST: 14 U/L — ABNORMAL LOW (ref 15–41)
Albumin: 4.2 g/dL (ref 3.5–5.0)
Alkaline Phosphatase: 74 U/L (ref 38–126)
Anion gap: 7 (ref 5–15)
BUN: 27 mg/dL — ABNORMAL HIGH (ref 8–23)
CO2: 28 mmol/L (ref 22–32)
Calcium: 9.1 mg/dL (ref 8.9–10.3)
Chloride: 104 mmol/L (ref 98–111)
Creatinine: 0.95 mg/dL (ref 0.44–1.00)
GFR, Estimated: >60 mL/min (ref >60)
Glucose, Bld: 88 mg/dL (ref 70–99)
Potassium: 5.2 mmol/L — ABNORMAL HIGH (ref 3.5–5.1)
Sodium: 139 mmol/L (ref 135–145)
Total Bilirubin: 0.5 mg/dL (ref 0.0–1.2)
Total Protein: 6.7 g/dL (ref 6.5–8.1)

## 2024-02-16 LAB — PLATELET FUNCTION ASSAY
Collagen / ADP: >300 s — ABNORMAL HIGH (ref 0–118)
Collagen / Epinephrine: 298 s — ABNORMAL HIGH (ref 0–193)

## 2024-02-16 LAB — FERRITIN: Ferritin: 205 ng/mL (ref 11–307)

## 2024-02-16 NOTE — Progress Notes (Signed)
 Hematology and Oncology Follow Up Visit  Meredith Dominguez [id number] 29-Nov-1950 73 y.o. 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  She recently was hospitalized because of a bleed.  She had a bleed into the right buttock.  This happened spontaneously.  She had not fallen.  The bleed tracked down her thigh.  She lost quite a bit of blood.  We did go ahead and give her  Humate-P.  This really did help.  She also got iron as she was iron deficient.  Again I am unsure as to how this happened.  However, this seems to be responding quite nicely.  The fullness in the right buttock is decreasing.  The area of ecchymoses is resolving quite nicely.  She also is dealing with this elevated lambda light chain.  I am still not sure as to why she has this lambda light chain.  When she was in the hospital, the lambda light chain was up to 45.7 mg/dL.  She did do a 24-hour urine.  In the hospital, there was only 5.23 mg/L of lambda light chain.  There is no Bence-Jones protein in her urine.  Again, I am just happy that there is large hematoma is resolving.  She now is post to have some dental work done.  I told her the that I probably would hold off on the dental work probably for another month or so.  I think if she cannot have dental work, she will need to have Humate-P given before any procedure.  Overall, I would have to say that her performance status is probably ECOG 1.   Medications:  Allergies as of 02/16/2024       Reactions   Monosodium Glutamate Other (See Comments)   Hot and feels badly with fatigue, Flushing   Red Dye #40 (allura Red) Other (See Comments)   NO DYES!!   Gluten Meal Other (See Comments)   Sensitivity   Other Other (See Comments)   Red places near  the collarbone- might be connected to Von Willebrand disease    Sesame Oil Rash   Turns bright, ear and anus open and close.   Tomato Other (See Comments)   And, other night shade vegetables like eggplants   Wheat Other (See Comments)   Stomach issues- can tolerate organic wheat   Sulfa Antibiotics Rash        Medication List        Accurate as of February 16, 2024  3:57 PM. If you have any questions, ask your nurse or doctor.          CALCIUM LACTATE PO Take 3 tablets by mouth daily as needed (for bleeds).   MAGNESIUM CITRATE PO Take 150 mg by mouth daily.   NON FORMULARY Take 9 drops by mouth See admin instructions. Coxsackie 200C Bioactive Nutritional- Place 9 drops in the mouth a day as needed for heart health   NON FORMULARY Take 9 drops by mouth See admin instructions. Stomachforce A  Bioactive Nutritional - Place 9 drops in the mouth once a day as needed for amoebas or parasites   NON FORMULARY Take 2,775 mg by mouth See admin instructions. Calcium bone support 925 mg - Take 2,775 mg by mouth once a day   NON FORMULARY Take  1 capsule by mouth See admin instructions. For-Til B12 - Take 1 capsule by mouth once a day   NONFORMULARY OR COMPOUNDED ITEM Take 1 capsule by mouth See admin instructions. Peak Thyroid support - Take 1 capsule by mouth once a day   NONFORMULARY OR COMPOUNDED ITEM Adrenal by Venko - Take 2 capsules by mouth once a day   NONFORMULARY OR COMPOUNDED ITEM See admin instructions. Digestin capsule - Take 1 capsule by mouth 2 times a day after a heavy meal   NONFORMULARY OR COMPOUNDED ITEM Take 1-2 capsules by mouth See admin instructions. Gluten dairy digest capsules - Take 1-2 capsules by mouth once a day as needed to tolerate dairy   NONFORMULARY OR COMPOUNDED ITEM Take 3 tablets by mouth See admin instructions. Cyruta Plus tablets - Take 3 tablets by mouth three times a day   OVER THE COUNTER MEDICATION Take 1 tablet by mouth. HYDROXO B12  WITH FOLINIC ACID - Chew 1 tablet by mouth once a day as needed to combat Von Willebrand disease   OVER THE COUNTER MEDICATION Take by mouth See admin instructions. Spleen thymus liquescence-  Take 0.5 dropperful by mouthl twice a day   OVER THE COUNTER MEDICATION Take 5-10 drops by mouth See admin instructions. Infection EGA II -  Place 5-10 drops in the mouth three times a day as needed to improve the immune system and combat giardia   OVER THE COUNTER MEDICATION daily. Ferro food.        Allergies:  Allergies  Allergen Reactions   Monosodium Glutamate Other (See Comments)    Hot and feels badly with fatigue, Flushing   Red Dye #40 (Allura Red) Other (See Comments)    NO DYES!!   Gluten Meal Other (See Comments)    Sensitivity    Other Other (See Comments)    Red places near the collarbone- might be connected to Von Willebrand disease    Sesame Oil Rash    Turns bright, ear and anus open and close.   Tomato Other (See Comments)    And, other night shade vegetables like eggplants   Wheat Other (See Comments)    Stomach issues- can tolerate organic wheat   Sulfa Antibiotics Rash    Past Medical History, Surgical history, Social history, and Family History were reviewed and updated.  Review of Systems: Review of Systems  Constitutional: Negative.   HENT: Negative.    Eyes: Negative.   Respiratory: Negative.    Cardiovascular: Negative.   Gastrointestinal: Negative.   Genitourinary: Negative.   Musculoskeletal: Negative.   Skin: Negative.   Neurological: Negative.   Endo/Heme/Allergies:  Bruises/bleeds easily.  Psychiatric/Behavioral: Negative.       Physical Exam:  height is 5' 6.5" (1.689 m) and weight is 138 lb (62.6 kg). Her oral temperature is 98.1 F (36.7 C). Her blood pressure is 117/58 (abnormal) and her pulse is 64. Her respiration is 20 and oxygen saturation is 100%.   Wt Readings from Last 3 Encounters:  02/16/24 138 lb (62.6 kg)  01/31/24 140 lb  3.2 oz (63.6 kg)  01/04/24 140 lb 1.9 oz (63.6 kg)    Physical Exam Skin:    Comments: On the buttock, particularly the right buttock, she does have resolving ecchymoses.  There is no fullness noted on the buttock any longer.  She has some faint ecchymoses on the right leg.      Lab Results  Component Value Date   WBC 8.6 02/16/2024   HGB 11.2 (  L) 02/16/2024   HCT 35.1 (L) 02/16/2024   MCV 92.6 02/16/2024   PLT 98 (L) 02/16/2024   Lab Results  Component Value Date   FERRITIN 97 02/02/2024   IRON 49 02/02/2024   TIBC 269 02/02/2024   UIBC 220 02/02/2024   IRONPCTSAT 18 02/02/2024   Lab Results  Component Value Date   RBC 3.79 (L) 02/16/2024   Lab Results  Component Value Date   KPAFRELGTCHN 24.8 (H) 02/02/2024   LAMBDASER 457.1 (H) 02/02/2024   KAPLAMBRATIO 4.45 02/03/2024   Lab Results  Component Value Date   IGGSERUM 798 02/02/2024   IGA 111 02/02/2024   IGMSERUM 81 02/02/2024   Lab Results  Component Value Date   TOTALPROTELP 5.8 (L) 02/02/2024   ALBUMINELP 3.6 06/11/2023   A1GS 0.3 06/11/2023   A2GS 0.8 06/11/2023   BETS 0.9 06/11/2023   GAMS 1.0 06/11/2023   MSPIKE 0.3 (H) 06/11/2023   SPEI Comment 06/11/2023     Chemistry      Component Value Date/Time   NA 139 02/16/2024 1349   K 5.2 (H) 02/16/2024 1349   CL 104 02/16/2024 1349   CO2 28 02/16/2024 1349   BUN 27 (H) 02/16/2024 1349   CREATININE 0.95 02/16/2024 1349      Component Value Date/Time   CALCIUM 9.1 02/16/2024 1349   ALKPHOS 74 02/16/2024 1349   AST 14 (L) 02/16/2024 1349   ALT 8 02/16/2024 1349   BILITOT 0.5 02/16/2024 1349       Impression and Plan: Meredith Dominguez is a a very pleasant 73 yo caucasian female with history of type IIA Von Willebrand disease. 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    Again, she had this bleed.  Thankfully, this certainly does seem to be doing better.  I am glad that the Humate-P really  helped.  Again, it think any invasive procedure that she will need, she will need to have Humate-P being given to try to help minimize bleeding.  Still not sure what to make of this light chain elevation in.  Is not in her urine.  Her blood counts do not really affected.  Her renal function is not affected.  For now, I would just watch this.     Josph Macho, MD 3/26/20253:57 PM

## 2024-02-17 LAB — KAPPA/LAMBDA LIGHT CHAINS
Kappa free light chain: 30.5 mg/L — ABNORMAL HIGH (ref 3.3–19.4)
Kappa, lambda light chain ratio: 0.08 — ABNORMAL LOW (ref 0.26–1.65)
Lambda free light chains: 395.7 mg/L — ABNORMAL HIGH (ref 5.7–26.3)

## 2024-02-17 LAB — IGG, IGA, IGM
IgA: 138 mg/dL (ref 64–422)
IgG (Immunoglobin G), Serum: 1073 mg/dL (ref 586–1602)
IgM (Immunoglobulin M), Srm: 110 mg/dL (ref 26–217)

## 2024-02-17 LAB — IRON AND IRON BINDING CAPACITY (CC-WL,HP ONLY)
Iron: 67 ug/dL (ref 28–170)
Saturation Ratios: 21 % (ref 10.4–31.8)
TIBC: 328 ug/dL (ref 250–450)
UIBC: 261 ug/dL (ref 148–442)

## 2024-02-21 LAB — PROTEIN ELECTROPHORESIS, SERUM
A/G Ratio: 1.2 (ref 0.7–1.7)
Albumin ELP: 3.5 g/dL (ref 2.9–4.4)
Alpha-1-Globulin: 0.3 g/dL (ref 0.0–0.4)
Alpha-2-Globulin: 0.7 g/dL (ref 0.4–1.0)
Beta Globulin: 0.9 g/dL (ref 0.7–1.3)
Gamma Globulin: 1 g/dL (ref 0.4–1.8)
Globulin, Total: 2.9 g/dL (ref 2.2–3.9)
Total Protein ELP: 6.4 g/dL (ref 6.0–8.5)

## 2024-03-22 ENCOUNTER — Encounter: Payer: Self-pay | Admitting: Family

## 2024-03-22 ENCOUNTER — Inpatient Hospital Stay (HOSPITAL_BASED_OUTPATIENT_CLINIC_OR_DEPARTMENT_OTHER): Payer: Self-pay | Admitting: Family

## 2024-03-22 ENCOUNTER — Inpatient Hospital Stay: Attending: Medical Oncology

## 2024-03-22 VITALS — BP 124/60 | HR 81 | Temp 98.5°F | Resp 18 | Wt 141.8 lb

## 2024-03-22 DIAGNOSIS — D696 Thrombocytopenia, unspecified: Secondary | ICD-10-CM | POA: Diagnosis not present

## 2024-03-22 DIAGNOSIS — Z882 Allergy status to sulfonamides status: Secondary | ICD-10-CM | POA: Diagnosis not present

## 2024-03-22 DIAGNOSIS — D62 Acute posthemorrhagic anemia: Secondary | ICD-10-CM

## 2024-03-22 DIAGNOSIS — S300XXA Contusion of lower back and pelvis, initial encounter: Secondary | ICD-10-CM | POA: Insufficient documentation

## 2024-03-22 DIAGNOSIS — R7989 Other specified abnormal findings of blood chemistry: Secondary | ICD-10-CM | POA: Insufficient documentation

## 2024-03-22 DIAGNOSIS — D68020 Von Willebrand disease, type 2a: Secondary | ICD-10-CM | POA: Diagnosis not present

## 2024-03-22 DIAGNOSIS — D509 Iron deficiency anemia, unspecified: Secondary | ICD-10-CM

## 2024-03-22 DIAGNOSIS — Z79899 Other long term (current) drug therapy: Secondary | ICD-10-CM | POA: Insufficient documentation

## 2024-03-22 DIAGNOSIS — D472 Monoclonal gammopathy: Secondary | ICD-10-CM

## 2024-03-22 LAB — CMP (CANCER CENTER ONLY)
ALT: 8 U/L (ref 0–44)
AST: 13 U/L — ABNORMAL LOW (ref 15–41)
Albumin: 4.1 g/dL (ref 3.5–5.0)
Alkaline Phosphatase: 70 U/L (ref 38–126)
Anion gap: 7 (ref 5–15)
BUN: 23 mg/dL (ref 8–23)
CO2: 27 mmol/L (ref 22–32)
Calcium: 9.4 mg/dL (ref 8.9–10.3)
Chloride: 103 mmol/L (ref 98–111)
Creatinine: 0.81 mg/dL (ref 0.44–1.00)
GFR, Estimated: >60 mL/min (ref >60)
Glucose, Bld: 105 mg/dL — ABNORMAL HIGH (ref 70–99)
Potassium: 4.5 mmol/L (ref 3.5–5.1)
Sodium: 137 mmol/L (ref 135–145)
Total Bilirubin: 0.6 mg/dL (ref 0.0–1.2)
Total Protein: 6.8 g/dL (ref 6.5–8.1)

## 2024-03-22 LAB — CBC WITH DIFFERENTIAL (CANCER CENTER ONLY)
Abs Immature Granulocytes: 0.05 10*3/uL (ref 0.00–0.07)
Basophils Absolute: 0.1 10*3/uL (ref 0.0–0.1)
Basophils Relative: 1 %
Eosinophils Absolute: 0.2 10*3/uL (ref 0.0–0.5)
Eosinophils Relative: 1 %
HCT: 33.6 % — ABNORMAL LOW (ref 36.0–46.0)
Hemoglobin: 11.1 g/dL — ABNORMAL LOW (ref 12.0–15.0)
Immature Granulocytes: 0 %
Lymphocytes Relative: 14 %
Lymphs Abs: 1.5 10*3/uL (ref 0.7–4.0)
MCH: 29.1 pg (ref 26.0–34.0)
MCHC: 33 g/dL (ref 30.0–36.0)
MCV: 88 fL (ref 80.0–100.0)
Monocytes Absolute: 0.7 10*3/uL (ref 0.1–1.0)
Monocytes Relative: 6 %
Neutro Abs: 8.8 10*3/uL — ABNORMAL HIGH (ref 1.7–7.7)
Neutrophils Relative %: 78 %
Platelet Count: 95 10*3/uL — ABNORMAL LOW (ref 150–400)
RBC: 3.82 MIL/uL — ABNORMAL LOW (ref 3.87–5.11)
RDW: 14.2 % (ref 11.5–15.5)
WBC Count: 11.2 10*3/uL — ABNORMAL HIGH (ref 4.0–10.5)
nRBC: 0 % (ref 0.0–0.2)

## 2024-03-22 LAB — SAVE SMEAR(SSMR), FOR PROVIDER SLIDE REVIEW

## 2024-03-22 LAB — APTT: aPTT: 29 s (ref 24–36)

## 2024-03-22 NOTE — Progress Notes (Signed)
 Hematology and Oncology Follow Up Visit  Meredith Dominguez [id number] 1951-04-21 73 y.o. 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 The bruising has resolved on the right buttock and she has healing bruises to the left buttock and left outer thigh. No hematoma noted.  No blood loss noted. No petechiae noted.  PTT is 29.  She needs to have "half" her teeth pulled in the next few months. She will let us  know when she schedules and will get Hum[employer]-P  the day of the procedure and the two days after. She is in agreement with the plan.  No fever, chills, n/v, cough, rash, dizziness, SOB, chest pain, palpitations, abdominal pain or changes in bowel or bladder habits.  She has mild puffiness in her ankles. No pitting edema. Pedal pulses are 1+.  No falls or syncope reported.  Appetite is good. She has started eating red meat again. Hydration good. Weight is stable at 141 lbs.   ECOG Performance Status: 1 - Symptomatic but completely ambulatory  Medications:  Allergies as of 03/22/2024       Reactions   Monosodium Glutamate Other (See Comments)   Hot and feels badly with fatigue, Flushing   Red Dye #40 (allura Red) Other (See Comments)   NO DYES!!   Gluten Meal Other (See Comments)   Sensitivity   Other Other (See Comments)   Red places near the collarbone- might be connected to Von Willebrand disease    Sesame Oil Rash   Turns bright, ear and anus open and close.   Tomato Other (See Comments)   And, other night shade vegetables like eggplants   Wheat Other (See Comments)   Stomach issues- can tolerate organic wheat   Sulfa Antibiotics Rash        Medication List        Accurate as of March 22, 2024  1:52 PM. If you have any questions, ask your nurse or doctor.           CALCIUM LACTATE PO Take 3 tablets by mouth daily as needed (for bleeds).   MAGNESIUM  CITRATE PO Take 150 mg by mouth daily.   NON FORMULARY Take 9 drops by mouth See admin instructions. [place] 200C Bioactive Nutritional- Place 9 drops in the mouth a day as needed for heart health   NON FORMULARY Take 9 drops by mouth See admin instructions. Stomachforce A  Bioactive Nutritional - Place 9 drops in the mouth once a day as needed for amoebas or parasites   NON FORMULARY Take 2,775 mg by mouth See admin instructions. Calcium bone support 925 mg - Take 2,775 mg by mouth once a day   NON FORMULARY Take 1 capsule by mouth See admin instructions. For-Til B12 - Take 1 capsule by mouth once a day   NONFORMULARY OR COMPOUNDED ITEM Take 1 capsule by mouth See admin instructions. Peak Thyroid  support - Take 1 capsule by mouth once a day   NONFORMULARY OR COMPOUNDED ITEM Adrenal by Venko - Take 2 capsules by mouth once a day   NONFORMULARY OR COMPOUNDED ITEM See admin instructions. Digestin capsule - Take 1 capsule by mouth 2 times a day after a heavy meal   NONFORMULARY OR COMPOUNDED ITEM Take 1-2 capsules by mouth See admin instructions. Gluten dairy digest  capsules - Take 1-2 capsules by mouth once a day as needed to tolerate dairy   NONFORMULARY OR COMPOUNDED ITEM Take 3 tablets by mouth See admin instructions. Cyruta Plus tablets - Take 3 tablets by mouth three times a day   OVER THE COUNTER MEDICATION Take 1 tablet by mouth. HYDROXO B12 WITH FOLINIC ACID - Chew 1 tablet by mouth once a day as needed to combat Von Willebrand disease   OVER THE COUNTER MEDICATION Take by mouth See admin instructions. Spleen thymus liquescence-  Take 0.5 dropperful by mouthl twice a day   OVER THE COUNTER MEDICATION Take 5-10 drops by mouth See admin instructions. Infection EGA II -  Place 5-10 drops in the mouth three times a day as needed to improve the immune system and combat  giardia   OVER THE COUNTER MEDICATION daily. Ferro food.        Allergies:  Allergies  Allergen Reactions   Monosodium Glutamate Other (See Comments)    Hot and feels badly with fatigue, Flushing   Red Dye #40 (Allura Red) Other (See Comments)    NO DYES!!   Gluten Meal Other (See Comments)    Sensitivity    Other Other (See Comments)    Red places near the collarbone- might be connected to Von Willebrand disease    Sesame Oil Rash    Turns bright, ear and anus open and close.   Tomato Other (See Comments)    And, other night shade vegetables like eggplants   Wheat Other (See Comments)    Stomach issues- can tolerate organic wheat   Sulfa Antibiotics Rash    Past Medical History, Surgical history, Social history, and Family History were reviewed and updated.  Review of Systems: All other 10 point review of systems is negative.   Physical Exam:  weight is 141 lb 12.8 oz (64.3 kg). Her oral temperature is 98.5 F (36.9 C). Her blood pressure is 124/60 and her pulse is 81. Her respiration is 18 and oxygen saturation is 99%.   Wt Readings from Last 3 Encounters:  03/22/24 141 lb 12.8 oz (64.3 kg)  02/16/24 138 lb (62.6 kg)  01/31/24 140 lb 3.2 oz (63.6 kg)    Ocular: Sclerae unicteric, pupils equal, round and reactive to light Ear-nose-throat: Oropharynx clear, dentition fair Lymphatic: No cervical or supraclavicular adenopathy Lungs no rales or rhonchi, good excursion bilaterally Heart regular rate and rhythm, no murmur appreciated Abd soft, nontender, positive bowel sounds MSK no focal spinal tenderness, no joint edema Neuro: non-focal, well-oriented, appropriate affect Breasts: Deferred   Lab Results  Component Value Date   WBC 11.2 (H) 03/22/2024   HGB 11.1 (L) 03/22/2024   HCT 33.6 (L) 03/22/2024   MCV 88.0 03/22/2024   PLT 95 (L) 03/22/2024   Lab Results  Component Value Date   FERRITIN 205 02/16/2024   IRON 67 02/16/2024   TIBC 328 02/16/2024    UIBC 261 02/16/2024   IRONPCTSAT 21 02/16/2024   Lab Results  Component Value Date   RBC 3.82 (L) 03/22/2024   Lab Results  Component Value Date   KPAFRELGTCHN 30.5 (H) 02/16/2024   LAMBDASER 395.7 (H) 02/16/2024   KAPLAMBRATIO 0.08 (L) 02/16/2024   Lab Results  Component Value Date   IGGSERUM 1,073 02/16/2024   IGA 138 02/16/2024   IGMSERUM 110 02/16/2024   Lab Results  Component Value Date   TOTALPROTELP 6.4 02/16/2024   ALBUMINELP 3.5 02/16/2024   A1GS 0.3 02/16/2024   A2GS 0.7  02/16/2024   BETS 0.9 02/16/2024   GAMS 1.0 02/16/2024   MSPIKE Not Observed 02/16/2024   SPEI Comment 02/16/2024     Chemistry      Component Value Date/Time   NA 139 02/16/2024 1349   K 5.2 (H) 02/16/2024 1349   CL 104 02/16/2024 1349   CO2 28 02/16/2024 1349   BUN 27 (H) 02/16/2024 1349   CREATININE 0.95 02/16/2024 1349      Component Value Date/Time   CALCIUM 9.1 02/16/2024 1349   ALKPHOS 74 02/16/2024 1349   AST 14 (L) 02/16/2024 1349   ALT 8 02/16/2024 1349   BILITOT 0.5 02/16/2024 1349       Impression and Plan:  Ms. Shane is a a very pleasant 73 yo caucasian female with history of type IIA Von Willebrand disease. 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  She is doing well right now. Platelets are stable at 95.  Iron and protein studies pending. We will plan to see her again in 2 months.    Kennard Pea, NP 4/30/20251:52 PM

## 2024-03-23 LAB — IGG, IGA, IGM
IgA: 131 mg/dL (ref 64–422)
IgG (Immunoglobin G), Serum: 963 mg/dL (ref 586–1602)
IgM (Immunoglobulin M), Srm: 97 mg/dL (ref 26–217)

## 2024-03-23 LAB — KAPPA/LAMBDA LIGHT CHAINS
Kappa free light chain: 24.8 mg/L — ABNORMAL HIGH (ref 3.3–19.4)
Kappa, lambda light chain ratio: 0.07 — ABNORMAL LOW (ref 0.26–1.65)
Lambda free light chains: 373.7 mg/L — ABNORMAL HIGH (ref 5.7–26.3)

## 2024-03-24 LAB — VON WILLEBRAND PANEL
Coagulation Factor VIII: 108 % (ref 56–140)
Ristocetin Co-factor, Plasma: 14 % — ABNORMAL LOW (ref 50–200)
Von Willebrand Antigen, Plasma: 130 % (ref 50–200)

## 2024-03-24 LAB — PROTEIN ELECTROPHORESIS, SERUM, WITH REFLEX
A/G Ratio: 1.1 (ref 0.7–1.7)
Albumin ELP: 3.4 g/dL (ref 2.9–4.4)
Alpha-1-Globulin: 0.3 g/dL (ref 0.0–0.4)
Alpha-2-Globulin: 0.8 g/dL (ref 0.4–1.0)
Beta Globulin: 0.8 g/dL (ref 0.7–1.3)
Gamma Globulin: 1 g/dL (ref 0.4–1.8)
Globulin, Total: 3 g/dL (ref 2.2–3.9)
Total Protein ELP: 6.4 g/dL (ref 6.0–8.5)

## 2024-03-24 LAB — COAG STUDIES INTERP REPORT

## 2024-03-27 ENCOUNTER — Telehealth: Payer: Self-pay | Admitting: *Deleted

## 2024-03-27 NOTE — Telephone Encounter (Signed)
 Call received from patient asking when she can see chiropractor again.  Spoke with Kennard Pea NP who stated that as long as she still has bruises it is not recommended that she go to the chiropractor until they are gone.  Patient understands information.

## 2024-04-04 ENCOUNTER — Ambulatory Visit: Payer: Medicare PPO | Admitting: Family

## 2024-04-04 ENCOUNTER — Inpatient Hospital Stay: Payer: Medicare PPO

## 2024-04-28 LAB — COLOGUARD: COLOGUARD: NEGATIVE

## 2024-05-22 ENCOUNTER — Emergency Department (HOSPITAL_COMMUNITY)

## 2024-05-22 ENCOUNTER — Inpatient Hospital Stay (HOSPITAL_COMMUNITY)

## 2024-05-22 ENCOUNTER — Other Ambulatory Visit: Payer: Self-pay

## 2024-05-22 ENCOUNTER — Encounter (HOSPITAL_COMMUNITY): Payer: Self-pay

## 2024-05-22 ENCOUNTER — Inpatient Hospital Stay (HOSPITAL_COMMUNITY)
Admission: EM | Admit: 2024-05-22 | Discharge: 2024-05-30 | DRG: 604 | Disposition: A | Discharge status: Skilled Nursing Facility | Attending: General Surgery | Admitting: General Surgery

## 2024-05-22 DIAGNOSIS — D62 Acute posthemorrhagic anemia: Secondary | ICD-10-CM | POA: Diagnosis present

## 2024-05-22 DIAGNOSIS — W2211XA Striking against or struck by driver side automobile airbag, initial encounter: Secondary | ICD-10-CM

## 2024-05-22 DIAGNOSIS — D696 Thrombocytopenia, unspecified: Secondary | ICD-10-CM | POA: Diagnosis present

## 2024-05-22 DIAGNOSIS — S2001XA Contusion of right breast, initial encounter: Secondary | ICD-10-CM | POA: Diagnosis present

## 2024-05-22 DIAGNOSIS — D68020 Von Willebrand disease, type 2a: Secondary | ICD-10-CM | POA: Diagnosis present

## 2024-05-22 DIAGNOSIS — D472 Monoclonal gammopathy: Secondary | ICD-10-CM | POA: Diagnosis present

## 2024-05-22 DIAGNOSIS — S300XXA Contusion of lower back and pelvis, initial encounter: Secondary | ICD-10-CM | POA: Diagnosis present

## 2024-05-22 DIAGNOSIS — D72829 Elevated white blood cell count, unspecified: Secondary | ICD-10-CM | POA: Diagnosis present

## 2024-05-22 DIAGNOSIS — D691 Qualitative platelet defects: Secondary | ICD-10-CM | POA: Diagnosis present

## 2024-05-22 DIAGNOSIS — R112 Nausea with vomiting, unspecified: Secondary | ICD-10-CM | POA: Diagnosis not present

## 2024-05-22 DIAGNOSIS — D649 Anemia, unspecified: Secondary | ICD-10-CM | POA: Diagnosis not present

## 2024-05-22 DIAGNOSIS — Z882 Allergy status to sulfonamides status: Secondary | ICD-10-CM | POA: Diagnosis not present

## 2024-05-22 DIAGNOSIS — D6800 Von Willebrand disease, unspecified: Secondary | ICD-10-CM | POA: Insufficient documentation

## 2024-05-22 DIAGNOSIS — R7989 Other specified abnormal findings of blood chemistry: Secondary | ICD-10-CM | POA: Diagnosis present

## 2024-05-22 DIAGNOSIS — Y92410 Unspecified street and highway as the place of occurrence of the external cause: Secondary | ICD-10-CM

## 2024-05-22 DIAGNOSIS — S301XXA Contusion of abdominal wall, initial encounter: Principal | ICD-10-CM | POA: Diagnosis present

## 2024-05-22 DIAGNOSIS — S301XXD Contusion of abdominal wall, subsequent encounter: Secondary | ICD-10-CM | POA: Diagnosis not present

## 2024-05-22 DIAGNOSIS — M549 Dorsalgia, unspecified: Secondary | ICD-10-CM | POA: Diagnosis present

## 2024-05-22 DIAGNOSIS — R578 Other shock: Secondary | ICD-10-CM | POA: Diagnosis present

## 2024-05-22 DIAGNOSIS — R109 Unspecified abdominal pain: Secondary | ICD-10-CM | POA: Diagnosis present

## 2024-05-22 DIAGNOSIS — R531 Weakness: Secondary | ICD-10-CM | POA: Diagnosis present

## 2024-05-22 DIAGNOSIS — T794XXA Traumatic shock, initial encounter: Secondary | ICD-10-CM | POA: Diagnosis present

## 2024-05-22 DIAGNOSIS — S7012XA Contusion of left thigh, initial encounter: Secondary | ICD-10-CM | POA: Diagnosis present

## 2024-05-22 DIAGNOSIS — E611 Iron deficiency: Secondary | ICD-10-CM | POA: Diagnosis present

## 2024-05-22 DIAGNOSIS — S8002XA Contusion of left knee, initial encounter: Secondary | ICD-10-CM | POA: Diagnosis present

## 2024-05-22 LAB — COMPREHENSIVE METABOLIC PANEL WITH GFR
ALT: 11 U/L (ref 0–44)
AST: 17 U/L (ref 15–41)
Albumin: 2.3 g/dL — ABNORMAL LOW (ref 3.5–5.0)
Alkaline Phosphatase: 41 U/L (ref 38–126)
Anion gap: 10 (ref 5–15)
BUN: 18 mg/dL (ref 8–23)
CO2: 19 mmol/L — ABNORMAL LOW (ref 22–32)
Calcium: 7.1 mg/dL — ABNORMAL LOW (ref 8.9–10.3)
Chloride: 109 mmol/L (ref 98–111)
Creatinine, Ser: 0.96 mg/dL (ref 0.44–1.00)
GFR, Estimated: >60 mL/min (ref >60)
Glucose, Bld: 140 mg/dL — ABNORMAL HIGH (ref 70–99)
Potassium: 4.3 mmol/L (ref 3.5–5.1)
Sodium: 138 mmol/L (ref 135–145)
Total Bilirubin: 0.7 mg/dL (ref 0.0–1.2)
Total Protein: 4.1 g/dL — ABNORMAL LOW (ref 6.5–8.1)

## 2024-05-22 LAB — MRSA NEXT GEN BY PCR, NASAL: MRSA by PCR Next Gen: NOT DETECTED

## 2024-05-22 LAB — PREPARE RBC (CROSSMATCH)

## 2024-05-22 LAB — CBC
HCT: 30.7 % — ABNORMAL LOW (ref 36.0–46.0)
HCT: 33.3 % — ABNORMAL LOW (ref 36.0–46.0)
Hemoglobin: 9.9 g/dL — ABNORMAL LOW (ref 12.0–15.0)
Hemoglobin: 11.5 g/dL — ABNORMAL LOW (ref 12.0–15.0)
MCH: 28.9 pg (ref 26.0–34.0)
MCH: 29.6 pg (ref 26.0–34.0)
MCHC: 32.2 g/dL (ref 30.0–36.0)
MCHC: 34.5 g/dL (ref 30.0–36.0)
MCV: 89.8 fL (ref 80.0–100.0)
MCV: 85.8 fL (ref 80.0–100.0)
Platelets: 87 K/uL — ABNORMAL LOW (ref 150–400)
Platelets: 63 K/uL — ABNORMAL LOW (ref 150–400)
RBC: 3.42 MIL/uL — ABNORMAL LOW (ref 3.87–5.11)
RBC: 3.88 MIL/uL (ref 3.87–5.11)
RDW: 14.4 % (ref 11.5–15.5)
RDW: 14.2 % (ref 11.5–15.5)
WBC: 17.6 K/uL — ABNORMAL HIGH (ref 4.0–10.5)
WBC: 20.3 K/uL — ABNORMAL HIGH (ref 4.0–10.5)
nRBC: 0 % (ref 0.0–0.2)
nRBC: 0 % (ref 0.0–0.2)

## 2024-05-22 LAB — I-STAT CHEM 8, ED
BUN: 21 mg/dL (ref 8–23)
Calcium, Ion: 1 mmol/L — ABNORMAL LOW (ref 1.15–1.40)
Chloride: 110 mmol/L (ref 98–111)
Creatinine, Ser: 1 mg/dL (ref 0.44–1.00)
Glucose, Bld: 120 mg/dL — ABNORMAL HIGH (ref 70–99)
HCT: 30 % — ABNORMAL LOW (ref 36.0–46.0)
Hemoglobin: 10.2 g/dL — ABNORMAL LOW (ref 12.0–15.0)
Potassium: 3.9 mmol/L (ref 3.5–5.1)
Sodium: 139 mmol/L (ref 135–145)
TCO2: 19 mmol/L — ABNORMAL LOW (ref 22–32)

## 2024-05-22 LAB — PROTIME-INR
INR: 1.2 (ref 0.8–1.2)
Prothrombin Time: 16.1 s — ABNORMAL HIGH (ref 11.4–15.2)

## 2024-05-22 LAB — I-STAT CG4 LACTIC ACID, ED: Lactic Acid, Venous: 2.3 mmol/L (ref 0.5–1.9)

## 2024-05-22 LAB — ETHANOL: Alcohol, Ethyl (B): <15 mg/dL (ref <15)

## 2024-05-22 LAB — APTT: aPTT: 29 s (ref 24–36)

## 2024-05-22 LAB — ABO/RH: ABO/RH(D): O POS

## 2024-05-22 MED ORDER — ANTIHEMOPHILIC FACTOR-VWF 250-600 UNITS IV SOLR
4122.0000 [IU] | Freq: Once | INTRAVENOUS | Status: AC
  Administered 2024-05-22: 4122 [IU] via INTRAVENOUS
  Filled 2024-05-22: qty 4122

## 2024-05-22 MED ORDER — OXYCODONE HCL 5 MG PO TABS: 5.0000 mg | ORAL_TABLET | ORAL | Status: DC | PRN

## 2024-05-22 MED ORDER — HYDRALAZINE HCL 20 MG/ML IJ SOLN: 10.0000 mg | INTRAMUSCULAR | Status: DC | PRN

## 2024-05-22 MED ORDER — MORPHINE SULFATE (PF) 4 MG/ML IV SOLN
4.0000 mg | INTRAVENOUS | Status: DC | PRN
  Administered 2024-05-25: 4 mg via INTRAVENOUS
  Filled 2024-05-22: qty 1

## 2024-05-22 MED ORDER — ANTIHEMOPHILIC FACTOR-VWF 250-600 UNITS IV SOLR: 80.0000 [IU]/kg | Freq: Three times a day (TID) | INTRAVENOUS | Status: DC

## 2024-05-22 MED ORDER — METHOCARBAMOL 1000 MG/10ML IJ SOLN
500.0000 mg | Freq: Three times a day (TID) | INTRAMUSCULAR | Status: AC
  Administered 2024-05-22 – 2024-05-23 (×2): 500 mg via INTRAVENOUS
  Filled 2024-05-22 (×2): qty 10

## 2024-05-22 MED ORDER — ANTIHEMOPHILIC FACTOR-VWF 250-600 UNITS IV SOLR
4538.0000 [IU] | Freq: Once | INTRAVENOUS | Status: AC
  Administered 2024-05-22: 4538 [IU] via INTRAVENOUS
  Filled 2024-05-22: qty 4538

## 2024-05-22 MED ORDER — CALCIUM CARBONATE 1250 (500 CA) MG PO TABS: 1.0000 | ORAL_TABLET | Freq: Three times a day (TID) | ORAL | Status: DC

## 2024-05-22 MED ORDER — CALCIUM GLUCONATE-NACL 1-0.675 GM/50ML-% IV SOLN: 1.0000 g | Freq: Once | INTRAVENOUS | Status: DC

## 2024-05-22 MED ORDER — DOCUSATE SODIUM 100 MG PO CAPS
100.0000 mg | ORAL_CAPSULE | Freq: Two times a day (BID) | ORAL | Status: DC
  Administered 2024-05-22 – 2024-05-23 (×2): 100 mg via ORAL
  Filled 2024-05-22 (×3): qty 1

## 2024-05-22 MED ORDER — METHOCARBAMOL 500 MG PO TABS
500.0000 mg | ORAL_TABLET | Freq: Three times a day (TID) | ORAL | Status: AC
  Administered 2024-05-23 – 2024-05-25 (×6): 500 mg via ORAL
  Filled 2024-05-22 (×7): qty 1

## 2024-05-22 MED ORDER — ONDANSETRON 4 MG PO TBDP
4.0000 mg | ORAL_TABLET | Freq: Four times a day (QID) | ORAL | Status: DC | PRN
Start: 2024-05-22 — End: 2024-05-31

## 2024-05-22 MED ORDER — ANTIHEMOPHILIC FACTOR-VWF 250-600 UNITS IV SOLR
4546.0000 [IU] | Freq: Once | INTRAVENOUS | Status: AC
  Administered 2024-05-23: 4546 [IU] via INTRAVENOUS
  Filled 2024-05-22: qty 4546

## 2024-05-22 MED ORDER — CALCIUM GLUCONATE-NACL 2-0.675 GM/100ML-% IV SOLN
2.0000 g | Freq: Once | INTRAVENOUS | Status: AC
  Administered 2024-05-22: 2000 mg via INTRAVENOUS
  Filled 2024-05-22: qty 100

## 2024-05-22 MED ORDER — IOHEXOL 350 MG/ML SOLN
75.0000 mL | Freq: Once | INTRAVENOUS | Status: AC | PRN
  Administered 2024-05-22: 75 mL via INTRAVENOUS

## 2024-05-22 MED ORDER — POLYETHYLENE GLYCOL 3350 17 G PO PACK: 17.0000 g | PACK | Freq: Every day | ORAL | Status: DC | PRN

## 2024-05-22 MED ORDER — MAGNESIUM CITRATE PO SOLN: 0.5000 | Freq: Every day | ORAL | Status: DC | PRN

## 2024-05-22 MED ORDER — SODIUM CHLORIDE 0.9 % IV SOLN: INTRAVENOUS | Status: AC

## 2024-05-22 MED ORDER — SODIUM CHLORIDE 0.9% IV SOLUTION: Freq: Once | INTRAVENOUS | Status: AC

## 2024-05-22 MED ORDER — CHLORHEXIDINE GLUCONATE CLOTH 2 % EX PADS
6.0000 | MEDICATED_PAD | Freq: Every day | CUTANEOUS | Status: DC
  Administered 2024-05-23 – 2024-05-30 (×5): 6 via TOPICAL

## 2024-05-22 MED ORDER — CALCIUM LACTATE 100 MG PO TABS: 1.0000 | ORAL_TABLET | Freq: Three times a day (TID) | ORAL | Status: DC

## 2024-05-22 MED ORDER — CALCIUM CARBONATE ANTACID 500 MG PO CHEW
1.0000 | CHEWABLE_TABLET | Freq: Three times a day (TID) | ORAL | Status: DC
  Administered 2024-05-22 – 2024-05-30 (×8): 200 mg via ORAL
  Filled 2024-05-22 (×16): qty 1

## 2024-05-22 MED ORDER — ACETAMINOPHEN 500 MG PO TABS
1000.0000 mg | ORAL_TABLET | Freq: Three times a day (TID) | ORAL | Status: DC
  Administered 2024-05-22 – 2024-05-27 (×12): 1000 mg via ORAL
  Filled 2024-05-22 (×13): qty 2

## 2024-05-22 MED ORDER — OXYCODONE HCL 5 MG PO TABS: 2.5000 mg | ORAL_TABLET | ORAL | Status: DC | PRN

## 2024-05-22 MED ORDER — ONDANSETRON HCL 4 MG/2ML IJ SOLN
4.0000 mg | Freq: Four times a day (QID) | INTRAMUSCULAR | Status: DC | PRN
  Administered 2024-05-28: 4 mg via INTRAVENOUS
  Filled 2024-05-22: qty 2

## 2024-05-22 NOTE — ED Notes (Signed)
CCMD notified
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
## 2024-05-22 NOTE — Progress Notes (Signed)
 Trauma Event Note  Pt hypotensive 82/50 (62). Abdominal and breast hematomas assessed and not progressing.  Abd binder and breast binder are tight and in good position.  Minimal border extension from skin marking earlier.  Notified Dr Stevie.  2U PRBCs ordered.   Last imported Vital Signs BP (!) 84/63 Comment: MD notified  Pulse 86   Temp 98.2 F (36.8 C) (Oral)   Resp (!) 21   Ht 5' 5 (1.651 m)   Wt 134 lb (60.8 kg)   SpO2 96%   BMI 22.30 kg/m   Trending CBC Recent Labs    05/22/24 1150 05/22/24 1206 05/22/24 1541  WBC 17.6*  --  20.3*  HGB 9.9* 10.2* 11.5*  HCT 30.7* 30.0* 33.3*  PLT 87*  --  63*    Trending Coag's Recent Labs    05/22/24 1154 05/22/24 1745  APTT  --  29  INR 1.2  --     Trending BMET Recent Labs    05/22/24 1154 05/22/24 1206  NA 138 139  K 4.3 3.9  CL 109 110  CO2 19*  --   BUN 18 21  CREATININE 0.96 1.00  GLUCOSE 140* 120*    Ardyce Heyer W  Trauma Response RN  Please call TRN at [phone] for further assistance.

## 2024-05-22 NOTE — ED Notes (Signed)
ABD BINDER APPLIED.

## 2024-05-22 NOTE — ED Notes (Signed)
 Breast binder applied

## 2024-05-23 ENCOUNTER — Inpatient Hospital Stay

## 2024-05-23 ENCOUNTER — Inpatient Hospital Stay: Admitting: Family

## 2024-05-23 DIAGNOSIS — D6800 Von Willebrand disease, unspecified: Secondary | ICD-10-CM

## 2024-05-23 LAB — TYPE AND SCREEN
ABO/RH(D): O POS
Antibody Screen: NEGATIVE
Unit division: 0
Unit division: 0
Unit division: 0
Unit division: 0
Unit division: 0
Unit division: 0

## 2024-05-23 LAB — CBC
HCT: 34.7 % — ABNORMAL LOW (ref 36.0–46.0)
Hemoglobin: 11.8 g/dL — ABNORMAL LOW (ref 12.0–15.0)
MCH: 29.1 pg (ref 26.0–34.0)
MCHC: 34 g/dL (ref 30.0–36.0)
MCV: 85.5 fL (ref 80.0–100.0)
Platelets: 41 K/uL — ABNORMAL LOW (ref 150–400)
RBC: 4.06 MIL/uL (ref 3.87–5.11)
RDW: 15.2 % (ref 11.5–15.5)
WBC: 8.8 K/uL (ref 4.0–10.5)
nRBC: 0 % (ref 0.0–0.2)

## 2024-05-23 LAB — CBC WITH DIFFERENTIAL/PLATELET
Abs Immature Granulocytes: 0.02 K/uL (ref 0.00–0.07)
Basophils Absolute: 0.1 K/uL (ref 0.0–0.1)
Basophils Relative: 1 %
Eosinophils Absolute: 0.1 K/uL (ref 0.0–0.5)
Eosinophils Relative: 1 %
HCT: 35 % — ABNORMAL LOW (ref 36.0–46.0)
Hemoglobin: 12.2 g/dL (ref 12.0–15.0)
Immature Granulocytes: 0 %
Lymphocytes Relative: 19 %
Lymphs Abs: 1.6 K/uL (ref 0.7–4.0)
MCH: 29.2 pg (ref 26.0–34.0)
MCHC: 34.9 g/dL (ref 30.0–36.0)
MCV: 83.7 fL (ref 80.0–100.0)
Monocytes Absolute: 1 K/uL (ref 0.1–1.0)
Monocytes Relative: 12 %
Neutro Abs: 5.7 K/uL (ref 1.7–7.7)
Neutrophils Relative %: 67 %
Platelets: 41 K/uL — ABNORMAL LOW (ref 150–400)
RBC: 4.18 MIL/uL (ref 3.87–5.11)
RDW: 14.9 % (ref 11.5–15.5)
WBC: 8.5 K/uL (ref 4.0–10.5)
nRBC: 0 % (ref 0.0–0.2)

## 2024-05-23 LAB — BPAM RBC
Blood Product Expiration Date: 202507152359
Blood Product Expiration Date: 202507242359
Blood Product Expiration Date: 202507252359
Blood Product Expiration Date: 202507252359
Blood Product Expiration Date: 202508052359
Blood Product Expiration Date: 202508052359
ISSUE DATE / TIME: 202506301154
ISSUE DATE / TIME: 202506301154
ISSUE DATE / TIME: 202506301247
ISSUE DATE / TIME: 202506301321
ISSUE DATE / TIME: 202506302011
ISSUE DATE / TIME: 202506302310
Unit Type and Rh: 5100
Unit Type and Rh: 5100
Unit Type and Rh: 5100
Unit Type and Rh: 5100
Unit Type and Rh: 5100
Unit Type and Rh: 5100

## 2024-05-23 LAB — COMPREHENSIVE METABOLIC PANEL WITH GFR
ALT: 16 U/L (ref 0–44)
AST: 25 U/L (ref 15–41)
Albumin: 2.6 g/dL — ABNORMAL LOW (ref 3.5–5.0)
Alkaline Phosphatase: 41 U/L (ref 38–126)
Anion gap: 9 (ref 5–15)
BUN: 23 mg/dL (ref 8–23)
CO2: 20 mmol/L — ABNORMAL LOW (ref 22–32)
Calcium: 7.7 mg/dL — ABNORMAL LOW (ref 8.9–10.3)
Chloride: 108 mmol/L (ref 98–111)
Creatinine, Ser: 1 mg/dL (ref 0.44–1.00)
GFR, Estimated: 59 mL/min — ABNORMAL LOW (ref >60)
Glucose, Bld: 97 mg/dL (ref 70–99)
Potassium: 4.2 mmol/L (ref 3.5–5.1)
Sodium: 137 mmol/L (ref 135–145)
Total Bilirubin: 1.2 mg/dL (ref 0.0–1.2)
Total Protein: 4.8 g/dL — ABNORMAL LOW (ref 6.5–8.1)

## 2024-05-23 LAB — PREPARE FRESH FROZEN PLASMA
Unit division: 0
Unit division: 0

## 2024-05-23 LAB — BPAM FFP
Blood Product Expiration Date: 202507042359
Blood Product Expiration Date: 202507042359
Blood Product Expiration Date: 202507062359
Blood Product Expiration Date: 202507062359
ISSUE DATE / TIME: 202506301206
ISSUE DATE / TIME: 202506301206
ISSUE DATE / TIME: 202506301248
ISSUE DATE / TIME: 202506301321
Unit Type and Rh: 6200
Unit Type and Rh: 6200
Unit Type and Rh: 6200
Unit Type and Rh: 6200

## 2024-05-23 LAB — APTT: aPTT: 27 s (ref 24–36)

## 2024-05-23 MED ORDER — ENSURE PLUS HIGH PROTEIN PO LIQD
237.0000 mL | Freq: Two times a day (BID) | ORAL | Status: DC
  Administered 2024-05-23 – 2024-05-30 (×11): 237 mL via ORAL
  Filled 2024-05-23: qty 237

## 2024-05-23 MED ORDER — TRAMADOL HCL 50 MG PO TABS
50.0000 mg | ORAL_TABLET | Freq: Four times a day (QID) | ORAL | Status: DC | PRN
  Administered 2024-05-23 – 2024-05-26 (×8): 50 mg via ORAL
  Filled 2024-05-23 (×9): qty 1

## 2024-05-23 MED ORDER — MAGNESIUM CITRATE PO SOLN
0.5000 | Freq: Every day | ORAL | Status: DC | PRN
  Administered 2024-05-24: 0.5 via ORAL
  Filled 2024-05-23: qty 296

## 2024-05-23 NOTE — TOC CAGE-AID Note (Signed)
 Transition of Care [facility]) - CAGE-AID Screening   Patient Details  Name: Charizma Gardiner MRN: [id number] Date of Birth: [birth year]  [facility]) CM/SW Contact:    Montia Haslip E Shataria Crist, LCSW Phone Number: 05/23/2024, 11:51 AM   Clinical Narrative: Patient reports she drinks alcohol very rarely. Denies other substance usage. Denies SA resource needs.  CAGE-AID Screening:    Have You Ever Felt You Ought to Cut Down on Your Drinking or Drug Use?: No Have People Annoyed You By Critizing Your Drinking Or Drug Use?: No Have You Felt Bad Or Guilty About Your Drinking Or Drug Use?: No Have You Ever Had a Drink or Used Drugs First Thing In The Morning to Steady Your Nerves or to Get Rid of a Hangover?: No CAGE-AID Score: 0  Substance Abuse Education Offered: No

## 2024-05-23 NOTE — Plan of Care (Signed)
   Problem: Health Behavior/Discharge Planning: Goal: Ability to manage health-related needs will improve Outcome: Progressing   Problem: Clinical Measurements: Goal: Ability to maintain clinical measurements within normal limits will improve Outcome: Progressing   Problem: Clinical Measurements: Goal: Will remain free from infection Outcome: Progressing
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
## 2024-05-23 NOTE — Progress Notes (Signed)
 Order for Mag citrate 0.5 bottle daily prn accidentally d/c'd by this RN. Medication reordered per original order
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
## 2024-05-23 NOTE — NC FL2 (Signed)
 [place]  MEDICAID FL2 LEVEL OF CARE FORM     IDENTIFICATION  Patient Name: Meredith Dominguez Birthdate: 10-29-1951 Sex: female Admission Date (Current Location): 05/22/2024  [facility] and [place] Number:  Producer, television/film/video and Address:  [facility], [address] [place], [place] [address]      Provider Number: [id number]  Attending Physician Name and Address:  Md, Trauma, MD  Relative Name and Phone Number:  VANIA PRIES Pricilla)  [phone] [facility])    Current Level of Care: Hospital Recommended Level of Care: Skilled Nursing Facility Prior Approval Number:    Date Approved/Denied:   PASRR Number: [id number]  Discharge Plan:      Current Diagnoses: Patient Active Problem List   Diagnosis Date Noted   Abdominal wall hematoma 05/22/2024   VWD (von Willebrand's disease) (HCC) 05/22/2024    Orientation RESPIRATION BLADDER Height & Weight     Self, Time, Situation, Place  Normal External catheter Weight: 134 lb (60.8 kg) Height:  5' 5 (165.1 cm)  BEHAVIORAL SYMPTOMS/MOOD NEUROLOGICAL BOWEL NUTRITION STATUS      Continent Diet (reg)  AMBULATORY STATUS COMMUNICATION OF NEEDS Skin   Limited Assist Verbally Skin abrasions, Other (Comment) (hematoma)                       Personal Care Assistance Level of Assistance  Bathing, Feeding, Dressing Bathing Assistance: Limited assistance Feeding assistance: Limited assistance Dressing Assistance: Limited assistance     Functional Limitations Info             SPECIAL CARE FACTORS FREQUENCY  OT (By licensed OT), PT (By licensed PT)     PT Frequency: 5 times per week OT Frequency: 5 times per week            Contractures      Additional Factors Info  Code Status, Allergies Code Status Info: full Allergies Info: Sulfa Antibiotics           Current Medications (05/23/2024):  This is the current hospital active medication list Current Facility-Administered Medications   Medication Dose Route Frequency Provider Last Rate Last Admin   acetaminophen (TYLENOL) tablet 1,000 mg  1,000 mg Oral Q8H Sebastian Moles, MD   1,000 mg at 05/23/24 0946   calcium carbonate (TUMS - dosed in mg elemental calcium) chewable tablet 200 mg of elemental calcium  1 tablet Oral TID Kinsinger, [name] Righter, MD   200 mg of elemental calcium at 05/23/24 0946   Chlorhexidine Gluconate Cloth 2 % PADS 6 each  6 each Topical Q0600 Sebastian Moles, MD   6 each at 05/23/24 0545   feeding supplement (ENSURE PLUS HIGH PROTEIN) liquid 237 mL  237 mL Oral BID BM Sebastian Moles, MD   237 mL at 05/23/24 1338   hydrALAZINE (APRESOLINE) injection 10 mg  10 mg Intravenous Q2H PRN Sebastian Moles, MD       magnesium citrate solution 0.5 Bottle  0.5 Bottle Oral Daily PRN Vicci Burnard SAUNDERS, PA-C       methocarbamol (ROBAXIN) tablet 500 mg  500 mg Oral Q8H Sebastian Moles, MD   500 mg at 05/23/24 9187   Or   methocarbamol (ROBAXIN) injection 500 mg  500 mg Intravenous Q8H Thompson, Burke, MD   500 mg at 05/22/24 2042   morphine (PF) 4 MG/ML injection 4 mg  4 mg Intravenous Q2H PRN Sebastian Moles, MD       ondansetron (ZOFRAN-ODT) disintegrating tablet 4 mg  4  mg Oral Q6H PRN Sebastian Moles, MD       Or   ondansetron (ZOFRAN) injection 4 mg  4 mg Intravenous Q6H PRN Sebastian Moles, MD       polyethylene glycol (MIRALAX / GLYCOLAX) packet 17 g  17 g Oral Daily PRN Sebastian Moles, MD       traMADol DANNY) tablet 50 mg  50 mg Oral Q6H PRN Sebastian Moles, MD         Discharge Medications: Please see discharge summary for a list of discharge medications.  Relevant Imaging Results:  Relevant Lab Results:   Additional Information SS #: [id number]  Meredith Dziuba E Tariah Transue, LCSW
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
## 2024-05-24 DIAGNOSIS — D6800 Von Willebrand disease, unspecified: Secondary | ICD-10-CM | POA: Diagnosis not present

## 2024-05-24 LAB — CBC WITH DIFFERENTIAL/PLATELET
Abs Immature Granulocytes: 0.03 K/uL (ref 0.00–0.07)
Basophils Absolute: 0.1 K/uL (ref 0.0–0.1)
Basophils Relative: 1 %
Eosinophils Absolute: 0.3 K/uL (ref 0.0–0.5)
Eosinophils Relative: 3 %
HCT: 30.9 % — ABNORMAL LOW (ref 36.0–46.0)
Hemoglobin: 10.4 g/dL — ABNORMAL LOW (ref 12.0–15.0)
Immature Granulocytes: 0 %
Lymphocytes Relative: 18 %
Lymphs Abs: 1.8 K/uL (ref 0.7–4.0)
MCH: 29.1 pg (ref 26.0–34.0)
MCHC: 33.7 g/dL (ref 30.0–36.0)
MCV: 86.3 fL (ref 80.0–100.0)
Monocytes Absolute: 1 K/uL (ref 0.1–1.0)
Monocytes Relative: 10 %
Neutro Abs: 6.9 K/uL (ref 1.7–7.7)
Neutrophils Relative %: 68 %
Platelets: 44 K/uL — ABNORMAL LOW (ref 150–400)
RBC: 3.58 MIL/uL — ABNORMAL LOW (ref 3.87–5.11)
RDW: 15.2 % (ref 11.5–15.5)
WBC: 10.1 K/uL (ref 4.0–10.5)
nRBC: 0 % (ref 0.0–0.2)

## 2024-05-24 LAB — COMPREHENSIVE METABOLIC PANEL WITH GFR
ALT: 16 U/L (ref 0–44)
AST: 18 U/L (ref 15–41)
Albumin: 2.5 g/dL — ABNORMAL LOW (ref 3.5–5.0)
Alkaline Phosphatase: 40 U/L (ref 38–126)
Anion gap: 6 (ref 5–15)
BUN: 29 mg/dL — ABNORMAL HIGH (ref 8–23)
CO2: 23 mmol/L (ref 22–32)
Calcium: 8 mg/dL — ABNORMAL LOW (ref 8.9–10.3)
Chloride: 110 mmol/L (ref 98–111)
Creatinine, Ser: 0.88 mg/dL (ref 0.44–1.00)
GFR, Estimated: >60 mL/min (ref >60)
Glucose, Bld: 98 mg/dL (ref 70–99)
Potassium: 4.3 mmol/L (ref 3.5–5.1)
Sodium: 139 mmol/L (ref 135–145)
Total Bilirubin: 0.8 mg/dL (ref 0.0–1.2)
Total Protein: 4.7 g/dL — ABNORMAL LOW (ref 6.5–8.1)

## 2024-05-24 LAB — APTT: aPTT: 30 s (ref 24–36)

## 2024-05-24 MED ORDER — POLYETHYLENE GLYCOL 3350 17 G PO PACK
17.0000 g | PACK | Freq: Two times a day (BID) | ORAL | Status: DC
  Administered 2024-05-24 – 2024-05-29 (×5): 17 g via ORAL
  Filled 2024-05-24 (×5): qty 1
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
## 2024-05-24 NOTE — Plan of Care (Signed)
   Problem: Education: Goal: Knowledge of General Education information will improve Description Including pain rating scale, medication(s)/side effects and non-pharmacologic comfort measures Outcome: Progressing   Problem: Health Behavior/Discharge Planning: Goal: Ability to manage health-related needs will improve Outcome: Progressing

## 2024-05-24 NOTE — Progress Notes (Signed)
 24H urine start time is 05/23/24 @ 2300
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
## 2024-05-25 DIAGNOSIS — D6800 Von Willebrand disease, unspecified: Secondary | ICD-10-CM | POA: Diagnosis not present

## 2024-05-25 LAB — CBC WITH DIFFERENTIAL/PLATELET
Abs Immature Granulocytes: 0.07 K/uL (ref 0.00–0.07)
Basophils Absolute: 0.1 K/uL (ref 0.0–0.1)
Basophils Relative: 1 %
Eosinophils Absolute: 0.2 K/uL (ref 0.0–0.5)
Eosinophils Relative: 2 %
HCT: 26.5 % — ABNORMAL LOW (ref 36.0–46.0)
Hemoglobin: 9 g/dL — ABNORMAL LOW (ref 12.0–15.0)
Immature Granulocytes: 1 %
Lymphocytes Relative: 12 %
Lymphs Abs: 1.3 K/uL (ref 0.7–4.0)
MCH: 29.8 pg (ref 26.0–34.0)
MCHC: 34 g/dL (ref 30.0–36.0)
MCV: 87.7 fL (ref 80.0–100.0)
Monocytes Absolute: 1 K/uL (ref 0.1–1.0)
Monocytes Relative: 9 %
Neutro Abs: 8.3 K/uL — ABNORMAL HIGH (ref 1.7–7.7)
Neutrophils Relative %: 75 %
Platelets: 53 K/uL — ABNORMAL LOW (ref 150–400)
RBC: 3.02 MIL/uL — ABNORMAL LOW (ref 3.87–5.11)
RDW: 15.3 % (ref 11.5–15.5)
WBC: 10.9 K/uL — ABNORMAL HIGH (ref 4.0–10.5)
nRBC: 0 % (ref 0.0–0.2)

## 2024-05-25 LAB — APTT: aPTT: 29 s (ref 24–36)

## 2024-05-25 LAB — KAPPA/LAMBDA LIGHT CHAINS
Kappa free light chain: 23.8 mg/L — ABNORMAL HIGH (ref 3.3–19.4)
Kappa, lambda light chain ratio: 0.06 — ABNORMAL LOW (ref 0.26–1.65)
Lambda free light chains: 378.1 mg/L — ABNORMAL HIGH (ref 5.7–26.3)

## 2024-05-25 LAB — IGG, IGA, IGM
IgA: 105 mg/dL (ref 64–422)
IgG (Immunoglobin G), Serum: 670 mg/dL (ref 586–1602)
IgM (Immunoglobulin M), Srm: 65 mg/dL (ref 26–217)

## 2024-05-25 LAB — COMPREHENSIVE METABOLIC PANEL WITH GFR
ALT: 18 U/L (ref 0–44)
AST: 20 U/L (ref 15–41)
Albumin: 2.5 g/dL — ABNORMAL LOW (ref 3.5–5.0)
Alkaline Phosphatase: 42 U/L (ref 38–126)
Anion gap: 9 (ref 5–15)
BUN: 23 mg/dL (ref 8–23)
CO2: 23 mmol/L (ref 22–32)
Calcium: 8.1 mg/dL — ABNORMAL LOW (ref 8.9–10.3)
Chloride: 104 mmol/L (ref 98–111)
Creatinine, Ser: 0.81 mg/dL (ref 0.44–1.00)
GFR, Estimated: >60 mL/min (ref >60)
Glucose, Bld: 95 mg/dL (ref 70–99)
Potassium: 4.2 mmol/L (ref 3.5–5.1)
Sodium: 136 mmol/L (ref 135–145)
Total Bilirubin: 1 mg/dL (ref 0.0–1.2)
Total Protein: 4.8 g/dL — ABNORMAL LOW (ref 6.5–8.1)

## 2024-05-25 LAB — IRON AND TIBC
Iron: 39 ug/dL (ref 28–170)
Saturation Ratios: 16 % (ref 10.4–31.8)
TIBC: 242 ug/dL — ABNORMAL LOW (ref 250–450)
UIBC: 203 ug/dL

## 2024-05-25 LAB — VITAMIN B12: Vitamin B-12: 571 pg/mL (ref 180–914)

## 2024-05-25 MED ORDER — GUAIFENESIN 200 MG PO TABS
200.0000 mg | ORAL_TABLET | Freq: Four times a day (QID) | ORAL | Status: AC
  Administered 2024-05-25 – 2024-05-26 (×2): 200 mg via ORAL
  Filled 2024-05-25 (×4): qty 1
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
## 2024-05-25 NOTE — Plan of Care (Signed)
 Patient able to verbalize and teach back plan of care
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
## 2024-05-25 NOTE — TOC Progression Note (Addendum)
 Transition of Care [facility]) - Progression Note    Patient Details  Name: Terin Dierolf MRN: [id number] Date of Birth: [birth year]  [facility]) CM/SW Contact  Tiffiany Beadles E Teja Judice, LCSW Phone Number: 05/25/2024, 2:45 PM  Clinical Narrative:    CSW met with patient at bedside. Patient aware of SNF bed offers ([facility], [facility], [facility], [facility]). Patient states out of current SNF offers she would choose [facility], however she prefers CIR. Patient understands that this depends on PT/OT recs, per rounds can see if she progresses to being able to tolerate CIR. Patient not medically ready for DC yet. She is agreeable to SNF as back up plan.  Patient would like to see if [facility] or any SNFs in [facility] can accept her. [facility] declined. [facility] pending.     Barriers to Discharge: Continued Medical Work up  Expected Discharge Plan and Services       Living arrangements for the past 2 months: Apartment                                       Social Determinants of Health (SDOH) Interventions SDOH Screenings   Tobacco Use: Low Risk  (05/22/2024)    Readmission Risk Interventions     No data to display

## 2024-05-25 NOTE — Progress Notes (Signed)
   05/25/24 1700  Vitals  Temp 98.3 F (36.8 C)  Temp Source Oral  BP 119/72  MAP (mmHg) 86  BP Location Left Arm  BP Method Automatic  Patient Position (if appropriate) Lying  Pulse Rate 86  Pulse Rate Source Monitor  ECG Heart Rate 87  Resp 15  Level of Consciousness  Level of Consciousness Alert  MEWS COLOR  MEWS Score Color Green  Oxygen Therapy  SpO2 97 %  O2 Device Room Air  Pain Assessment  Pain Scale 0-10  Pain Score 0  Complaints & Interventions  Neuro symptoms relieved by Rest  PCA/Epidural/Spinal Assessment  Respiratory Pattern Regular;Unlabored  Glasgow Coma Scale  Eye Opening 4  Best Verbal Response (NON-intubated) 5  Best Motor Response 6  Glasgow Coma Scale Score 15  MEWS Score  MEWS Temp 0  MEWS Systolic 0  MEWS Pulse 0  MEWS RR 0  MEWS LOC 0  MEWS Score 0   Patient transfer from 4N ICU to 4NP-08. A&O X4 with 0/10 pain. Personal belongings with patient. VSS and assessment completed. Patient oriented to unit and staff. Cardiac tele applied, call bell within reach, and bed alarm on.

## 2024-05-26 DIAGNOSIS — D6800 Von Willebrand disease, unspecified: Secondary | ICD-10-CM | POA: Diagnosis not present

## 2024-05-26 LAB — CBC WITH DIFFERENTIAL/PLATELET
Abs Immature Granulocytes: 0.09 K/uL — ABNORMAL HIGH (ref 0.00–0.07)
Basophils Absolute: 0.1 K/uL (ref 0.0–0.1)
Basophils Relative: 1 %
Eosinophils Absolute: 0.3 K/uL (ref 0.0–0.5)
Eosinophils Relative: 3 %
HCT: 23.8 % — ABNORMAL LOW (ref 36.0–46.0)
Hemoglobin: 8 g/dL — ABNORMAL LOW (ref 12.0–15.0)
Immature Granulocytes: 1 %
Lymphocytes Relative: 12 %
Lymphs Abs: 1.2 K/uL (ref 0.7–4.0)
MCH: 29.6 pg (ref 26.0–34.0)
MCHC: 33.6 g/dL (ref 30.0–36.0)
MCV: 88.1 fL (ref 80.0–100.0)
Monocytes Absolute: 1 K/uL (ref 0.1–1.0)
Monocytes Relative: 10 %
Neutro Abs: 7.4 K/uL (ref 1.7–7.7)
Neutrophils Relative %: 73 %
Platelets: 63 K/uL — ABNORMAL LOW (ref 150–400)
RBC: 2.7 MIL/uL — ABNORMAL LOW (ref 3.87–5.11)
RDW: 15.3 % (ref 11.5–15.5)
WBC: 10 K/uL (ref 4.0–10.5)
nRBC: 0 % (ref 0.0–0.2)

## 2024-05-26 LAB — COMPREHENSIVE METABOLIC PANEL WITH GFR
ALT: 25 U/L (ref 0–44)
AST: 29 U/L (ref 15–41)
Albumin: 2.6 g/dL — ABNORMAL LOW (ref 3.5–5.0)
Alkaline Phosphatase: 39 U/L (ref 38–126)
Anion gap: 8 (ref 5–15)
BUN: 25 mg/dL — ABNORMAL HIGH (ref 8–23)
CO2: 24 mmol/L (ref 22–32)
Calcium: 8.2 mg/dL — ABNORMAL LOW (ref 8.9–10.3)
Chloride: 105 mmol/L (ref 98–111)
Creatinine, Ser: 0.87 mg/dL (ref 0.44–1.00)
GFR, Estimated: >60 mL/min (ref >60)
Glucose, Bld: 97 mg/dL (ref 70–99)
Potassium: 4.5 mmol/L (ref 3.5–5.1)
Sodium: 137 mmol/L (ref 135–145)
Total Bilirubin: 1.5 mg/dL — ABNORMAL HIGH (ref 0.0–1.2)
Total Protein: 5 g/dL — ABNORMAL LOW (ref 6.5–8.1)

## 2024-05-26 LAB — APTT: aPTT: 31 s (ref 24–36)

## 2024-05-26 MED ORDER — SODIUM CHLORIDE 0.9% IV SOLUTION: Freq: Once | INTRAVENOUS | Status: AC

## 2024-05-26 MED ORDER — ACETAMINOPHEN 325 MG PO TABS
650.0000 mg | ORAL_TABLET | Freq: Once | ORAL | Status: AC
  Administered 2024-05-26: 650 mg via ORAL
  Filled 2024-05-26: qty 2

## 2024-05-26 MED ORDER — FOLIC ACID 1 MG PO TABS
2.0000 mg | ORAL_TABLET | Freq: Every day | ORAL | Status: DC
  Administered 2024-05-26 – 2024-05-30 (×5): 2 mg via ORAL
  Filled 2024-05-26 (×5): qty 2

## 2024-05-26 MED ORDER — SODIUM CHLORIDE 0.9 % IV SOLN
250.0000 mg | Freq: Every day | INTRAVENOUS | Status: AC
  Administered 2024-05-26 – 2024-05-28 (×3): 250 mg via INTRAVENOUS
  Filled 2024-05-26 (×2): qty 20
  Filled 2024-05-26: qty 5

## 2024-05-26 MED ORDER — DIPHENHYDRAMINE HCL 25 MG PO CAPS
25.0000 mg | ORAL_CAPSULE | Freq: Once | ORAL | Status: AC
  Administered 2024-05-26: 25 mg via ORAL
  Filled 2024-05-26: qty 1

## 2024-05-26 MED ORDER — HYDROXYZINE HCL 25 MG PO TABS
25.0000 mg | ORAL_TABLET | Freq: Three times a day (TID) | ORAL | Status: DC | PRN
  Administered 2024-05-26 – 2024-05-30 (×2): 25 mg via ORAL
  Filled 2024-05-26 (×2): qty 1
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
## 2024-05-26 NOTE — Plan of Care (Signed)
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
## 2024-05-26 NOTE — TOC Progression Note (Signed)
 Transition of Care [facility]) - Progression Note    Patient Details  Name: Meredith Dominguez MRN: [id number] Date of Birth: [birth year]  Transition of Care [facility]) CM/SW Contact  Kali Ambler E Daijon Wenke, LCSW Phone Number: 05/26/2024, 1:13 PM  Clinical Narrative:    Updated patient regarding additional bed offer at [facility].      Barriers to Discharge: Continued Medical Work up  Expected Discharge Plan and Services       Living arrangements for the past 2 months: Apartment                                       Social Determinants of Health (SDOH) Interventions SDOH Screenings   Food Insecurity: No Food Insecurity (05/25/2024)  Housing: Unknown (05/25/2024)  Transportation Needs: No Transportation Needs (05/25/2024)  Utilities: Not At Risk (05/25/2024)  Social Connections: Unknown (05/25/2024)  Tobacco Use: Low Risk  (05/22/2024)    Readmission Risk Interventions     No data to display

## 2024-05-26 NOTE — Care Management Important Message (Signed)
 Important Message  Patient Details  Name: Meredith Dominguez MRN: [id number] Date of Birth: 03/11/1951   Important Message Given:  Yes - Medicare IM     Jon Cruel 05/26/2024, 12:01 PM
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
## 2024-05-27 DIAGNOSIS — D6800 Von Willebrand disease, unspecified: Secondary | ICD-10-CM | POA: Diagnosis not present

## 2024-05-27 LAB — CBC WITH DIFFERENTIAL/PLATELET
Abs Immature Granulocytes: 0.28 K/uL — ABNORMAL HIGH (ref 0.00–0.07)
Basophils Absolute: 0.1 K/uL (ref 0.0–0.1)
Basophils Relative: 1 %
Eosinophils Absolute: 0.1 K/uL (ref 0.0–0.5)
Eosinophils Relative: 1 %
HCT: 23.7 % — ABNORMAL LOW (ref 36.0–46.0)
Hemoglobin: 8.1 g/dL — ABNORMAL LOW (ref 12.0–15.0)
Immature Granulocytes: 2 %
Lymphocytes Relative: 9 %
Lymphs Abs: 1.4 K/uL (ref 0.7–4.0)
MCH: 30.3 pg (ref 26.0–34.0)
MCHC: 34.2 g/dL (ref 30.0–36.0)
MCV: 88.8 fL (ref 80.0–100.0)
Monocytes Absolute: 1.5 K/uL — ABNORMAL HIGH (ref 0.1–1.0)
Monocytes Relative: 10 %
Neutro Abs: 12.2 K/uL — ABNORMAL HIGH (ref 1.7–7.7)
Neutrophils Relative %: 77 %
Platelets: 103 K/uL — ABNORMAL LOW (ref 150–400)
RBC: 2.67 MIL/uL — ABNORMAL LOW (ref 3.87–5.11)
RDW: 15.6 % — ABNORMAL HIGH (ref 11.5–15.5)
WBC: 15.5 K/uL — ABNORMAL HIGH (ref 4.0–10.5)
nRBC: 0.2 % (ref 0.0–0.2)

## 2024-05-27 LAB — COMPREHENSIVE METABOLIC PANEL WITH GFR
ALT: 46 U/L — ABNORMAL HIGH (ref 0–44)
AST: 194 U/L — ABNORMAL HIGH (ref 15–41)
Albumin: 2.7 g/dL — ABNORMAL LOW (ref 3.5–5.0)
Alkaline Phosphatase: 44 U/L (ref 38–126)
Anion gap: 10 (ref 5–15)
BUN: 28 mg/dL — ABNORMAL HIGH (ref 8–23)
CO2: 19 mmol/L — ABNORMAL LOW (ref 22–32)
Calcium: 8.1 mg/dL — ABNORMAL LOW (ref 8.9–10.3)
Chloride: 101 mmol/L (ref 98–111)
Creatinine, Ser: 0.85 mg/dL (ref 0.44–1.00)
GFR, Estimated: >60 mL/min (ref >60)
Glucose, Bld: 106 mg/dL — ABNORMAL HIGH (ref 70–99)
Potassium: 4.9 mmol/L (ref 3.5–5.1)
Sodium: 130 mmol/L — ABNORMAL LOW (ref 135–145)
Total Bilirubin: 2.3 mg/dL — ABNORMAL HIGH (ref 0.0–1.2)
Total Protein: 5.3 g/dL — ABNORMAL LOW (ref 6.5–8.1)

## 2024-05-27 LAB — BPAM PLATELET PHERESIS
Blood Product Expiration Date: 202507042359
ISSUE DATE / TIME: 202507040833
Unit Type and Rh: 7300

## 2024-05-27 LAB — APTT: aPTT: 36 s (ref 24–36)

## 2024-05-27 LAB — PREPARE PLATELET PHERESIS: Unit division: 0

## 2024-05-27 LAB — PREPARE RBC (CROSSMATCH)

## 2024-05-27 MED ORDER — FUROSEMIDE 10 MG/ML IJ SOLN
20.0000 mg | Freq: Once | INTRAMUSCULAR | Status: AC
  Administered 2024-05-27: 20 mg via INTRAVENOUS
  Filled 2024-05-27: qty 2

## 2024-05-27 MED ORDER — FUROSEMIDE 10 MG/ML IJ SOLN
20.0000 mg | Freq: Once | INTRAMUSCULAR | Status: AC
  Administered 2024-05-27: 20 mg via INTRAVENOUS

## 2024-05-27 MED ORDER — SODIUM CHLORIDE 0.9% IV SOLUTION: Freq: Once | INTRAVENOUS | Status: AC

## 2024-05-27 MED ORDER — TRAMADOL HCL 50 MG PO TABS
50.0000 mg | ORAL_TABLET | Freq: Two times a day (BID) | ORAL | Status: DC | PRN
  Administered 2024-05-27 – 2024-05-30 (×2): 50 mg via ORAL
  Filled 2024-05-27 (×2): qty 1
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
## 2024-05-27 NOTE — Plan of Care (Signed)
  Problem: Education: Goal: Knowledge of General Education information will improve Description: Including pain rating scale, medication(s)/side effects and non-pharmacologic comfort measures 05/27/2024 0138 by Marvis Kenneth SAILOR, RN Outcome: Progressing 05/26/2024 1958 by Marvis Kenneth SAILOR, RN Outcome: Progressing   Problem: Health Behavior/Discharge Planning: Goal: Ability to manage health-related needs will improve 05/27/2024 0138 by Marvis Kenneth SAILOR, RN Outcome: Progressing 05/26/2024 1958 by Marvis Kenneth SAILOR, RN Outcome: Progressing   Problem: Clinical Measurements: Goal: Ability to maintain clinical measurements within normal limits will improve 05/27/2024 0138 by Marvis Kenneth SAILOR, RN Outcome: Progressing 05/26/2024 1958 by Marvis Kenneth SAILOR, RN Outcome: Progressing Goal: Will remain free from infection 05/27/2024 0138 by Marvis Kenneth SAILOR, RN Outcome: Progressing 05/26/2024 1958 by Marvis Kenneth SAILOR, RN Outcome: Progressing Goal: Diagnostic test results will improve 05/27/2024 0138 by Marvis Kenneth SAILOR, RN Outcome: Progressing 05/26/2024 1958 by Marvis Kenneth SAILOR, RN Outcome: Progressing Goal: Respiratory complications will improve 05/27/2024 0138 by Marvis Kenneth SAILOR, RN Outcome: Progressing 05/26/2024 1958 by Marvis Kenneth SAILOR, RN Outcome: Progressing Goal: Cardiovascular complication will be avoided 05/27/2024 0138 by Marvis Kenneth SAILOR, RN Outcome: Progressing 05/26/2024 1958 by Marvis Kenneth SAILOR, RN Outcome: Progressing   Problem: Activity: Goal: Risk for activity intolerance will decrease 05/27/2024 0138 by Marvis Kenneth SAILOR, RN Outcome: Progressing 05/26/2024 1958 by Marvis Kenneth SAILOR, RN Outcome: Progressing   Problem: Nutrition: Goal: Adequate nutrition will be maintained 05/27/2024 0138 by Marvis Kenneth SAILOR, RN Outcome: Progressing 05/26/2024 1958 by Marvis Kenneth SAILOR, RN Outcome: Progressing   Problem: Coping: Goal: Level of anxiety will decrease 05/27/2024  0138 by Marvis Kenneth SAILOR, RN Outcome: Progressing 05/26/2024 1958 by Marvis Kenneth SAILOR, RN Outcome: Progressing   Problem: Elimination: Goal: Will not experience complications related to bowel motility 05/27/2024 0138 by Marvis Kenneth SAILOR, RN Outcome: Progressing 05/26/2024 1958 by Marvis Kenneth SAILOR, RN Outcome: Progressing Goal: Will not experience complications related to urinary retention 05/27/2024 0138 by Marvis Kenneth SAILOR, RN Outcome: Progressing 05/26/2024 1958 by Marvis Kenneth SAILOR, RN Outcome: Progressing   Problem: Pain Managment: Goal: General experience of comfort will improve and/or be controlled 05/27/2024 0138 by Marvis Kenneth SAILOR, RN Outcome: Progressing 05/26/2024 1958 by Marvis Kenneth SAILOR, RN Outcome: Progressing   Problem: Safety: Goal: Ability to remain free from injury will improve 05/27/2024 0138 by Marvis Kenneth SAILOR, RN Outcome: Progressing 05/26/2024 1958 by Marvis Kenneth SAILOR, RN Outcome: Progressing   Problem: Skin Integrity: Goal: Risk for impaired skin integrity will decrease 05/27/2024 0138 by Marvis Kenneth SAILOR, RN Outcome: Progressing 05/26/2024 1958 by Marvis Kenneth SAILOR, RN Outcome: Progressing

## 2024-05-28 LAB — TYPE AND SCREEN
ABO/RH(D): O POS
Antibody Screen: NEGATIVE
Unit division: 0
Unit division: 0

## 2024-05-28 LAB — CBC WITH DIFFERENTIAL/PLATELET
Abs Immature Granulocytes: 0.34 K/uL — ABNORMAL HIGH (ref 0.00–0.07)
Basophils Absolute: 0 K/uL (ref 0.0–0.1)
Basophils Relative: 0 %
Eosinophils Absolute: 0 K/uL (ref 0.0–0.5)
Eosinophils Relative: 0 %
HCT: 31.1 % — ABNORMAL LOW (ref 36.0–46.0)
Hemoglobin: 11.1 g/dL — ABNORMAL LOW (ref 12.0–15.0)
Immature Granulocytes: 2 %
Lymphocytes Relative: 5 %
Lymphs Abs: 0.9 K/uL (ref 0.7–4.0)
MCH: 31.4 pg (ref 26.0–34.0)
MCHC: 35.7 g/dL (ref 30.0–36.0)
MCV: 88.1 fL (ref 80.0–100.0)
Monocytes Absolute: 1.9 K/uL — ABNORMAL HIGH (ref 0.1–1.0)
Monocytes Relative: 10 %
Neutro Abs: 16.4 K/uL — ABNORMAL HIGH (ref 1.7–7.7)
Neutrophils Relative %: 83 %
Platelets: 107 K/uL — ABNORMAL LOW (ref 150–400)
RBC: 3.53 MIL/uL — ABNORMAL LOW (ref 3.87–5.11)
RDW: 15.7 % — ABNORMAL HIGH (ref 11.5–15.5)
WBC: 19.6 K/uL — ABNORMAL HIGH (ref 4.0–10.5)
nRBC: 0.3 % — ABNORMAL HIGH (ref 0.0–0.2)

## 2024-05-28 LAB — BPAM RBC
Blood Product Expiration Date: 202508052359
Blood Product Expiration Date: 202508052359
ISSUE DATE / TIME: 202507050705
ISSUE DATE / TIME: 202507051002
Unit Type and Rh: 5100
Unit Type and Rh: 5100

## 2024-05-28 LAB — COMPREHENSIVE METABOLIC PANEL WITH GFR
ALT: 68 U/L — ABNORMAL HIGH (ref 0–44)
AST: 182 U/L — ABNORMAL HIGH (ref 15–41)
Albumin: 2.9 g/dL — ABNORMAL LOW (ref 3.5–5.0)
Alkaline Phosphatase: 65 U/L (ref 38–126)
Anion gap: 11 (ref 5–15)
BUN: 33 mg/dL — ABNORMAL HIGH (ref 8–23)
CO2: 20 mmol/L — ABNORMAL LOW (ref 22–32)
Calcium: 8.3 mg/dL — ABNORMAL LOW (ref 8.9–10.3)
Chloride: 100 mmol/L (ref 98–111)
Creatinine, Ser: 0.9 mg/dL (ref 0.44–1.00)
GFR, Estimated: >60 mL/min (ref >60)
Glucose, Bld: 115 mg/dL — ABNORMAL HIGH (ref 70–99)
Potassium: 4.2 mmol/L (ref 3.5–5.1)
Sodium: 131 mmol/L — ABNORMAL LOW (ref 135–145)
Total Bilirubin: 3.5 mg/dL — ABNORMAL HIGH (ref 0.0–1.2)
Total Protein: 5.9 g/dL — ABNORMAL LOW (ref 6.5–8.1)

## 2024-05-28 LAB — APTT: aPTT: 37 s — ABNORMAL HIGH (ref 24–36)

## 2024-05-28 NOTE — Progress Notes (Signed)
 Pt  took 3 bites of her dinner and vomited. MD notified. MD advised to continue monitoring.  Meanwhile, was notified by the day shift RN that pt had several episodes of bowel movement. MD also made aware of that.
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
## 2024-05-29 LAB — COMPREHENSIVE METABOLIC PANEL WITH GFR
ALT: 122 U/L — ABNORMAL HIGH (ref 0–44)
AST: 173 U/L — ABNORMAL HIGH (ref 15–41)
Albumin: 2.7 g/dL — ABNORMAL LOW (ref 3.5–5.0)
Alkaline Phosphatase: 79 U/L (ref 38–126)
Anion gap: 11 (ref 5–15)
BUN: 45 mg/dL — ABNORMAL HIGH (ref 8–23)
CO2: 21 mmol/L — ABNORMAL LOW (ref 22–32)
Calcium: 8.1 mg/dL — ABNORMAL LOW (ref 8.9–10.3)
Chloride: 96 mmol/L — ABNORMAL LOW (ref 98–111)
Creatinine, Ser: 1.03 mg/dL — ABNORMAL HIGH (ref 0.44–1.00)
GFR, Estimated: 57 mL/min — ABNORMAL LOW (ref >60)
Glucose, Bld: 108 mg/dL — ABNORMAL HIGH (ref 70–99)
Potassium: 4.1 mmol/L (ref 3.5–5.1)
Sodium: 128 mmol/L — ABNORMAL LOW (ref 135–145)
Total Bilirubin: 3.3 mg/dL — ABNORMAL HIGH (ref 0.0–1.2)
Total Protein: 5.7 g/dL — ABNORMAL LOW (ref 6.5–8.1)

## 2024-05-29 LAB — CBC WITH DIFFERENTIAL/PLATELET
Abs Immature Granulocytes: 0.4 K/uL — ABNORMAL HIGH (ref 0.00–0.07)
Basophils Absolute: 0.1 K/uL (ref 0.0–0.1)
Basophils Relative: 0 %
Eosinophils Absolute: 0 K/uL (ref 0.0–0.5)
Eosinophils Relative: 0 %
HCT: 30.1 % — ABNORMAL LOW (ref 36.0–46.0)
Hemoglobin: 10 g/dL — ABNORMAL LOW (ref 12.0–15.0)
Immature Granulocytes: 3 %
Lymphocytes Relative: 9 %
Lymphs Abs: 1.5 K/uL (ref 0.7–4.0)
MCH: 30.2 pg (ref 26.0–34.0)
MCHC: 33.2 g/dL (ref 30.0–36.0)
MCV: 90.9 fL (ref 80.0–100.0)
Monocytes Absolute: 1.7 K/uL — ABNORMAL HIGH (ref 0.1–1.0)
Monocytes Relative: 11 %
Neutro Abs: 12.5 K/uL — ABNORMAL HIGH (ref 1.7–7.7)
Neutrophils Relative %: 77 %
Platelets: 90 K/uL — ABNORMAL LOW (ref 150–400)
RBC: 3.31 MIL/uL — ABNORMAL LOW (ref 3.87–5.11)
RDW: 16.2 % — ABNORMAL HIGH (ref 11.5–15.5)
WBC: 16.2 K/uL — ABNORMAL HIGH (ref 4.0–10.5)
nRBC: 0.4 % — ABNORMAL HIGH (ref 0.0–0.2)

## 2024-05-29 LAB — APTT: aPTT: 43 s — ABNORMAL HIGH (ref 24–36)

## 2024-05-29 NOTE — Plan of Care (Signed)
  Problem: Education: Goal: Knowledge of General Education information will improve Description: Including pain rating scale, medication(s)/side effects and non-pharmacologic comfort measures 05/29/2024 0008 by Marvis Kenneth SAILOR, RN Outcome: Progressing 05/28/2024 2034 by Marvis Kenneth SAILOR, RN Outcome: Progressing   Problem: Health Behavior/Discharge Planning: Goal: Ability to manage health-related needs will improve 05/29/2024 0008 by Marvis Kenneth SAILOR, RN Outcome: Progressing 05/28/2024 2034 by Marvis Kenneth SAILOR, RN Outcome: Progressing   Problem: Clinical Measurements: Goal: Ability to maintain clinical measurements within normal limits will improve 05/29/2024 0008 by Marvis Kenneth SAILOR, RN Outcome: Progressing 05/28/2024 2034 by Marvis Kenneth SAILOR, RN Outcome: Progressing Goal: Will remain free from infection 05/29/2024 0008 by Marvis Kenneth SAILOR, RN Outcome: Progressing 05/28/2024 2034 by Marvis Kenneth SAILOR, RN Outcome: Progressing Goal: Diagnostic test results will improve 05/29/2024 0008 by Marvis Kenneth SAILOR, RN Outcome: Progressing 05/28/2024 2034 by Marvis Kenneth SAILOR, RN Outcome: Progressing Goal: Respiratory complications will improve 05/29/2024 0008 by Marvis Kenneth SAILOR, RN Outcome: Progressing 05/28/2024 2034 by Marvis Kenneth SAILOR, RN Outcome: Progressing Goal: Cardiovascular complication will be avoided 05/29/2024 0008 by Marvis Kenneth SAILOR, RN Outcome: Progressing 05/28/2024 2034 by Marvis Kenneth SAILOR, RN Outcome: Progressing   Problem: Activity: Goal: Risk for activity intolerance will decrease 05/29/2024 0008 by Marvis Kenneth SAILOR, RN Outcome: Progressing 05/28/2024 2034 by Marvis Kenneth SAILOR, RN Outcome: Progressing   Problem: Nutrition: Goal: Adequate nutrition will be maintained 05/29/2024 0008 by Marvis Kenneth SAILOR, RN Outcome: Progressing 05/28/2024 2034 by Marvis Kenneth SAILOR, RN Outcome: Progressing   Problem: Coping: Goal: Level of anxiety will decrease 05/29/2024  0008 by Marvis Kenneth SAILOR, RN Outcome: Progressing 05/28/2024 2034 by Marvis Kenneth SAILOR, RN Outcome: Progressing   Problem: Elimination: Goal: Will not experience complications related to bowel motility 05/29/2024 0008 by Marvis Kenneth SAILOR, RN Outcome: Progressing 05/28/2024 2034 by Marvis Kenneth SAILOR, RN Outcome: Progressing Goal: Will not experience complications related to urinary retention 05/29/2024 0008 by Marvis Kenneth SAILOR, RN Outcome: Progressing 05/28/2024 2034 by Marvis Kenneth SAILOR, RN Outcome: Progressing   Problem: Pain Managment: Goal: General experience of comfort will improve and/or be controlled 05/29/2024 0008 by Marvis Kenneth SAILOR, RN Outcome: Progressing 05/28/2024 2034 by Marvis Kenneth SAILOR, RN Outcome: Progressing   Problem: Safety: Goal: Ability to remain free from injury will improve 05/29/2024 0008 by Marvis Kenneth SAILOR, RN Outcome: Progressing 05/28/2024 2034 by Marvis Kenneth SAILOR, RN Outcome: Progressing   Problem: Skin Integrity: Goal: Risk for impaired skin integrity will decrease 05/29/2024 0008 by Marvis Kenneth SAILOR, RN Outcome: Progressing 05/28/2024 2034 by Marvis Kenneth SAILOR, RN Outcome: Progressing
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
## 2024-05-29 NOTE — Progress Notes (Signed)
 Inpatient Rehab Admissions Coordinator:   Per [facility] request, patient was screened for CIR candidacy by Leita Kleine, MS, CCC-SLP. At this time, I do not think that Pt. Demonstrates a diagnosis that payor will approve for CIR; however, I will not pursue a rehab consult for this Pt.   Recommend other rehab venues to be pursued.  Please contact me with any questions.  Leita Kleine, MS, CCC-SLP Rehab Admissions Coordinator  [phone] (celll) [phone] (office)

## 2024-05-29 NOTE — TOC Progression Note (Signed)
 Transition of Care [facility]) - Progression Note    Patient Details  Name: Meredith Dominguez MRN: [id number] Date of Birth: Aug 01, 1951  [facility] of [facility]) CM/SW Contact  Lauraine FORBES Saa, LCSW Phone Number: 05/29/2024, 2:58 PM  Clinical Narrative:     2:58 PM Per chart review, patient is agreeable to discharging to [facility] if not eligible for CIR. Patient is not eligible for CIR. CSW informed SNF of bed acceptance. CSW submitted insurance authorization request (ID [id number]) which was automatically approved 07/08-07/10 (grace period of 11:59PM 07/11). CSW relayed information to SNF who stated that they do not have a bed available tomorrow.    Barriers to Discharge: English as a second language teacher, Continued Medical Work up  [facility] In-house Referral: Clinical Social Work   Post Acute Care Choice: Skilled Nursing Facility Living arrangements for the past 2 months: Apartment                                       Social Determinants of Health (SDOH) Interventions SDOH Screenings   Food Insecurity: No Food Insecurity (05/25/2024)  Housing: Unknown (05/25/2024)  Transportation Needs: No Transportation Needs (05/25/2024)  Utilities: Not At Risk (05/25/2024)  Social Connections: Unknown (05/25/2024)  Tobacco Use: Low Risk  (05/22/2024)    Readmission Risk Interventions     No data to display
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
## 2024-05-30 ENCOUNTER — Other Ambulatory Visit (HOSPITAL_COMMUNITY): Payer: Self-pay

## 2024-05-30 DIAGNOSIS — D696 Thrombocytopenia, unspecified: Secondary | ICD-10-CM | POA: Diagnosis not present

## 2024-05-30 DIAGNOSIS — S301XXD Contusion of abdominal wall, subsequent encounter: Secondary | ICD-10-CM

## 2024-05-30 DIAGNOSIS — D6800 Von Willebrand disease, unspecified: Secondary | ICD-10-CM | POA: Diagnosis not present

## 2024-05-30 DIAGNOSIS — D649 Anemia, unspecified: Secondary | ICD-10-CM | POA: Diagnosis not present

## 2024-05-30 LAB — COMPREHENSIVE METABOLIC PANEL WITH GFR
ALT: 95 U/L — ABNORMAL HIGH (ref 0–44)
AST: 82 U/L — ABNORMAL HIGH (ref 15–41)
Albumin: 2.7 g/dL — ABNORMAL LOW (ref 3.5–5.0)
Alkaline Phosphatase: 86 U/L (ref 38–126)
Anion gap: 10 (ref 5–15)
BUN: 42 mg/dL — ABNORMAL HIGH (ref 8–23)
CO2: 21 mmol/L — ABNORMAL LOW (ref 22–32)
Calcium: 8.2 mg/dL — ABNORMAL LOW (ref 8.9–10.3)
Chloride: 99 mmol/L (ref 98–111)
Creatinine, Ser: 1.02 mg/dL — ABNORMAL HIGH (ref 0.44–1.00)
GFR, Estimated: 57 mL/min — ABNORMAL LOW (ref >60)
Glucose, Bld: 98 mg/dL (ref 70–99)
Potassium: 3.9 mmol/L (ref 3.5–5.1)
Sodium: 130 mmol/L — ABNORMAL LOW (ref 135–145)
Total Bilirubin: 4 mg/dL — ABNORMAL HIGH (ref 0.0–1.2)
Total Protein: 5.7 g/dL — ABNORMAL LOW (ref 6.5–8.1)

## 2024-05-30 LAB — APTT: aPTT: 36 s (ref 24–36)

## 2024-05-30 LAB — VON WILLEBRAND PANEL
Coagulation Factor VIII: 131 % (ref 56–140)
Ristocetin Co-factor, Plasma: 244 % — ABNORMAL HIGH (ref 50–200)
Von Willebrand Antigen, Plasma: 115 % (ref 50–200)

## 2024-05-30 LAB — COAG STUDIES INTERP REPORT

## 2024-05-30 LAB — CBC WITH DIFFERENTIAL/PLATELET
Abs Immature Granulocytes: 0.28 K/uL — ABNORMAL HIGH (ref 0.00–0.07)
Basophils Absolute: 0 K/uL (ref 0.0–0.1)
Basophils Relative: 0 %
Eosinophils Absolute: 0.1 K/uL (ref 0.0–0.5)
Eosinophils Relative: 1 %
HCT: 30.6 % — ABNORMAL LOW (ref 36.0–46.0)
Hemoglobin: 10.3 g/dL — ABNORMAL LOW (ref 12.0–15.0)
Immature Granulocytes: 2 %
Lymphocytes Relative: 7 %
Lymphs Abs: 1 K/uL (ref 0.7–4.0)
MCH: 31.3 pg (ref 26.0–34.0)
MCHC: 33.7 g/dL (ref 30.0–36.0)
MCV: 93 fL (ref 80.0–100.0)
Monocytes Absolute: 1.3 K/uL — ABNORMAL HIGH (ref 0.1–1.0)
Monocytes Relative: 9 %
Neutro Abs: 11.8 K/uL — ABNORMAL HIGH (ref 1.7–7.7)
Neutrophils Relative %: 81 %
Platelets: 102 K/uL — ABNORMAL LOW (ref 150–400)
RBC: 3.29 MIL/uL — ABNORMAL LOW (ref 3.87–5.11)
RDW: 17.8 % — ABNORMAL HIGH (ref 11.5–15.5)
WBC: 14.4 K/uL — ABNORMAL HIGH (ref 4.0–10.5)
nRBC: 0 % (ref 0.0–0.2)

## 2024-05-30 MED ORDER — ACETAMINOPHEN 500 MG PO TABS: 1000.0000 mg | ORAL_TABLET | Freq: Four times a day (QID) | ORAL | Status: DC | PRN

## 2024-05-30 MED ORDER — FOLIC ACID 1 MG PO TABS: 2.0000 mg | ORAL_TABLET | Freq: Every day | ORAL | Status: DC

## 2024-05-30 MED ORDER — ENSURE PLUS HIGH PROTEIN PO LIQD: 237.0000 mL | Freq: Two times a day (BID) | ORAL | Status: DC

## 2024-05-30 MED ORDER — TRAMADOL HCL 50 MG PO TABS
50.0000 mg | ORAL_TABLET | Freq: Four times a day (QID) | ORAL | Status: DC | PRN
  Administered 2024-05-30: 50 mg via ORAL
  Filled 2024-05-30: qty 1

## 2024-05-30 MED ORDER — POLYETHYLENE GLYCOL 3350 17 GM/SCOOP PO POWD
17.0000 g | Freq: Two times a day (BID) | ORAL | 0 refills | Status: DC
  Filled 2024-05-30: qty 238, 7d supply, fill #0

## 2024-05-30 MED ORDER — MORPHINE SULFATE (PF) 2 MG/ML IV SOLN: 2.0000 mg | INTRAVENOUS | Status: DC | PRN

## 2024-05-30 MED ORDER — CALCIUM CARBONATE ANTACID 500 MG PO CHEW: 1.0000 | CHEWABLE_TABLET | Freq: Three times a day (TID) | ORAL | Status: DC

## 2024-05-30 MED ORDER — HYDROXYZINE HCL 25 MG PO TABS
25.0000 mg | ORAL_TABLET | Freq: Three times a day (TID) | ORAL | Status: DC | PRN
Start: 2024-05-30 — End: 2024-07-19

## 2024-05-30 MED ORDER — TRAMADOL HCL 50 MG PO TABS: 50.0000 mg | ORAL_TABLET | Freq: Four times a day (QID) | ORAL | 0 refills | Status: DC | PRN
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
## 2024-05-30 NOTE — TOC Transition Note (Signed)
 Transition of Care [facility]) - Discharge Note   Patient Details  Name: Meredith Dominguez MRN: [id number] Date of Birth: Oct 14, 1951  Transition of Care [facility]) CM/SW Contact:  Montie LOISE Louder, LCSW Phone Number: 05/30/2024, 2:35 PM   Clinical Narrative:      Patient will Discharge to: [facility] Care Discharge Date: 05/30/24 Family Notified: friend Transport By: ROME  Per MD patient is ready for discharge. RN, patient, and facility notified of discharge. Discharge Summary sent to facility. RN given number for [phone]. Ambulance transport requested for patient.   Clinical Social Worker signing off.  Montie Louder, MSW, LCSW Clinical Social Worker      Barriers to Discharge: Barriers Resolved   Patient Goals and CMS Choice Patient states their goals for this hospitalization and ongoing recovery are:: SNF CMS Medicare.gov Compare Post Acute Care list provided to:: Patient Choice offered to / list presented to : Patient      Discharge Placement                       Discharge Plan and Services Additional resources added to the After Visit Summary for   In-house Referral: Clinical Social Work   Post Acute Care Choice: Skilled Nursing Facility                               Social Drivers of Health (SDOH) Interventions SDOH Screenings   Food Insecurity: No Food Insecurity (05/25/2024)  Housing: Unknown (05/25/2024)  Transportation Needs: No Transportation Needs (05/25/2024)  Utilities: Not At Risk (05/25/2024)  Social Connections: Unknown (05/25/2024)  Tobacco Use: Low Risk  (05/22/2024)     Readmission Risk Interventions     No data to display
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
## 2024-05-30 NOTE — Progress Notes (Signed)
 Report given to [employer] at [facility]. Pt waiting for [facility] for transportation. Canda Podgorski Flores-Velasquez RN

## 2024-05-30 NOTE — Plan of Care (Signed)
  Problem: Education: Goal: Knowledge of General Education information will improve Description: Including pain rating scale, medication(s)/side effects and non-pharmacologic comfort measures 05/30/2024 0202 by Marvis Kenneth SAILOR, RN Outcome: Progressing 05/29/2024 2007 by Marvis Kenneth SAILOR, RN Outcome: Progressing   Problem: Health Behavior/Discharge Planning: Goal: Ability to manage health-related needs will improve 05/30/2024 0202 by Marvis Kenneth SAILOR, RN Outcome: Progressing 05/29/2024 2007 by Marvis Kenneth SAILOR, RN Outcome: Progressing   Problem: Clinical Measurements: Goal: Ability to maintain clinical measurements within normal limits will improve 05/30/2024 0202 by Marvis Kenneth SAILOR, RN Outcome: Progressing 05/29/2024 2007 by Marvis Kenneth SAILOR, RN Outcome: Progressing Goal: Will remain free from infection 05/30/2024 0202 by Marvis Kenneth SAILOR, RN Outcome: Progressing 05/29/2024 2007 by Marvis Kenneth SAILOR, RN Outcome: Progressing Goal: Diagnostic test results will improve 05/30/2024 0202 by Marvis Kenneth SAILOR, RN Outcome: Progressing 05/29/2024 2007 by Marvis Kenneth SAILOR, RN Outcome: Progressing Goal: Respiratory complications will improve 05/30/2024 0202 by Marvis Kenneth SAILOR, RN Outcome: Progressing 05/29/2024 2007 by Marvis Kenneth SAILOR, RN Outcome: Progressing Goal: Cardiovascular complication will be avoided 05/30/2024 0202 by Marvis Kenneth SAILOR, RN Outcome: Progressing 05/29/2024 2007 by Marvis Kenneth SAILOR, RN Outcome: Progressing   Problem: Activity: Goal: Risk for activity intolerance will decrease 05/30/2024 0202 by Marvis Kenneth SAILOR, RN Outcome: Progressing 05/29/2024 2007 by Marvis Kenneth SAILOR, RN Outcome: Progressing   Problem: Nutrition: Goal: Adequate nutrition will be maintained 05/30/2024 0202 by Marvis Kenneth SAILOR, RN Outcome: Progressing 05/29/2024 2007 by Marvis Kenneth SAILOR, RN Outcome: Progressing   Problem: Coping: Goal: Level of anxiety will decrease 05/30/2024  0202 by Marvis Kenneth SAILOR, RN Outcome: Progressing 05/29/2024 2007 by Marvis Kenneth SAILOR, RN Outcome: Progressing   Problem: Elimination: Goal: Will not experience complications related to bowel motility 05/30/2024 0202 by Marvis Kenneth SAILOR, RN Outcome: Progressing 05/29/2024 2007 by Marvis Kenneth SAILOR, RN Outcome: Progressing Goal: Will not experience complications related to urinary retention 05/30/2024 0202 by Marvis Kenneth SAILOR, RN Outcome: Progressing 05/29/2024 2007 by Marvis Kenneth SAILOR, RN Outcome: Progressing   Problem: Pain Managment: Goal: General experience of comfort will improve and/or be controlled 05/30/2024 0202 by Marvis Kenneth SAILOR, RN Outcome: Progressing 05/29/2024 2007 by Marvis Kenneth SAILOR, RN Outcome: Progressing   Problem: Safety: Goal: Ability to remain free from injury will improve 05/30/2024 0202 by Marvis Kenneth SAILOR, RN Outcome: Progressing 05/29/2024 2007 by Marvis Kenneth SAILOR, RN Outcome: Progressing   Problem: Skin Integrity: Goal: Risk for impaired skin integrity will decrease 05/30/2024 0202 by Marvis Kenneth SAILOR, RN Outcome: Progressing 05/29/2024 2007 by Marvis Kenneth SAILOR, RN Outcome: Progressing

## 2024-05-30 NOTE — Progress Notes (Signed)
 Pt discharged to SNF([facility]) . Pt alert and oriented. Vital signs stable, SBP=103/68, MAP=79, TP=98.5 Report already called by the previous nurse. All belongings sent with pt.

## 2024-05-30 NOTE — TOC Progression Note (Signed)
 Transition of Care [facility]) - Progression Note    Patient Details  Name: Meredith Dominguez MRN: [id number] Date of Birth: 04/04/1951  Transition [facility]) CM/SW Contact  Montie LOISE Louder, [place] Phone Number: 05/30/2024, 11:13 AM  Clinical Narrative:     Talked with admission [facility] SNF/[facility]- she will informed CSW once they have availability and can admit patient. CSW informed authorization has been approved 7/8-7/10.  Montie Louder, MSW, LCSW Clinical Social Worker      Barriers to Discharge: English as a second language teacher, Continued Medical Work up  [facility] In-house Referral: Clinical Social Work   Post Acute Care Choice: Skilled Nursing Facility Living arrangements for the past 2 months: Apartment                                       Social Determinants of Health (SDOH) Interventions SDOH Screenings   Food Insecurity: No Food Insecurity (05/25/2024)  Housing: Unknown (05/25/2024)  Transportation Needs: No Transportation Needs (05/25/2024)  Utilities: Not At Risk (05/25/2024)  Social Connections: Unknown (05/25/2024)  Tobacco Use: Low Risk  (05/22/2024)    Readmission Risk Interventions     No data to display

## 2024-05-31 ENCOUNTER — Encounter: Payer: Self-pay | Admitting: Family

## 2024-05-31 ENCOUNTER — Other Ambulatory Visit (HOSPITAL_COMMUNITY): Payer: Self-pay

## 2024-05-31 ENCOUNTER — Encounter: Payer: Self-pay | Admitting: Hematology & Oncology

## 2024-05-31 LAB — UPEP/UIFE/LIGHT CHAINS/TP, 24-HR UR
% BETA, Urine: 22 %
ALPHA 1 URINE: 5.5 %
Albumin, U: 44.5 %
Alpha 2, Urine: 15.8 %
Free Kappa Lt Chains,Ur: 87.81 mg/L — ABNORMAL HIGH (ref 1.17–86.46)
Free Kappa/Lambda Ratio: 6.58 (ref 1.83–14.26)
Free Lambda Lt Chains,Ur: 13.34 mg/L (ref 0.27–15.21)
GAMMA GLOBULIN URINE: 12.2 %
Total Protein, Urine-Ur/day: 450 mg/(24.h) — ABNORMAL HIGH (ref 30–150)
Total Protein, Urine: 31 mg/dL
Total Volume: 1450

## 2024-06-01 ENCOUNTER — Telehealth: Payer: Self-pay | Admitting: Medical Oncology

## 2024-06-01 NOTE — Telephone Encounter (Signed)
 Pt returned call for scheduling hospital f/u. Pt did not wish to schedule at this time as she is still in rehab and feels weak. She noted d/c in 2 weeks and that her living situation may change on d/c. Pt asked for callback after d/c from rehab.

## 2024-06-16 ENCOUNTER — Telehealth: Payer: Self-pay | Admitting: Family

## 2024-06-16 NOTE — Telephone Encounter (Signed)
 called pt to schedule and pt needs to coordinate with home care for transportation will call back by end of day or early next week to schedule.

## 2024-06-20 ENCOUNTER — Encounter: Payer: Self-pay | Admitting: Medical Oncology

## 2024-06-20 ENCOUNTER — Inpatient Hospital Stay: Attending: Medical Oncology

## 2024-06-20 ENCOUNTER — Inpatient Hospital Stay (HOSPITAL_BASED_OUTPATIENT_CLINIC_OR_DEPARTMENT_OTHER): Admitting: Medical Oncology

## 2024-06-20 VITALS — BP 102/55 | HR 90 | Temp 97.7°F | Resp 19 | Ht 66.0 in | Wt 143.8 lb

## 2024-06-20 DIAGNOSIS — D696 Thrombocytopenia, unspecified: Secondary | ICD-10-CM

## 2024-06-20 DIAGNOSIS — Z882 Allergy status to sulfonamides status: Secondary | ICD-10-CM | POA: Diagnosis not present

## 2024-06-20 DIAGNOSIS — D68020 Von Willebrand disease, type 2a: Secondary | ICD-10-CM | POA: Diagnosis not present

## 2024-06-20 DIAGNOSIS — D62 Acute posthemorrhagic anemia: Secondary | ICD-10-CM

## 2024-06-20 DIAGNOSIS — Z79899 Other long term (current) drug therapy: Secondary | ICD-10-CM | POA: Diagnosis not present

## 2024-06-20 DIAGNOSIS — R600 Localized edema: Secondary | ICD-10-CM | POA: Diagnosis not present

## 2024-06-20 DIAGNOSIS — D509 Iron deficiency anemia, unspecified: Secondary | ICD-10-CM

## 2024-06-20 DIAGNOSIS — D472 Monoclonal gammopathy: Secondary | ICD-10-CM | POA: Insufficient documentation

## 2024-06-20 LAB — CBC WITH DIFFERENTIAL (CANCER CENTER ONLY)
Abs Immature Granulocytes: 0.06 K/uL (ref 0.00–0.07)
Basophils Absolute: 0 K/uL (ref 0.0–0.1)
Basophils Relative: 1 %
Eosinophils Absolute: 0.1 K/uL (ref 0.0–0.5)
Eosinophils Relative: 2 %
HCT: 39.7 % (ref 36.0–46.0)
Hemoglobin: 13 g/dL (ref 12.0–15.0)
Immature Granulocytes: 1 %
Lymphocytes Relative: 12 %
Lymphs Abs: 0.7 K/uL (ref 0.7–4.0)
MCH: 31.4 pg (ref 26.0–34.0)
MCHC: 32.7 g/dL (ref 30.0–36.0)
MCV: 95.9 fL (ref 80.0–100.0)
Monocytes Absolute: 0.7 K/uL (ref 0.1–1.0)
Monocytes Relative: 11 %
Neutro Abs: 4.4 K/uL (ref 1.7–7.7)
Neutrophils Relative %: 73 %
Platelet Count: 73 K/uL — ABNORMAL LOW (ref 150–400)
RBC: 4.14 MIL/uL (ref 3.87–5.11)
RDW: 17.9 % — ABNORMAL HIGH (ref 11.5–15.5)
WBC Count: 6 K/uL (ref 4.0–10.5)
nRBC: 0 % (ref 0.0–0.2)

## 2024-06-20 LAB — CMP (CANCER CENTER ONLY)
ALT: 9 U/L (ref 0–44)
AST: 18 U/L (ref 15–41)
Albumin: 4 g/dL (ref 3.5–5.0)
Alkaline Phosphatase: 112 U/L (ref 38–126)
Anion gap: 12 (ref 5–15)
BUN: 21 mg/dL (ref 8–23)
CO2: 21 mmol/L — ABNORMAL LOW (ref 22–32)
Calcium: 9 mg/dL (ref 8.9–10.3)
Chloride: 103 mmol/L (ref 98–111)
Creatinine: 0.87 mg/dL (ref 0.44–1.00)
GFR, Estimated: >60 mL/min (ref >60)
Glucose, Bld: 101 mg/dL — ABNORMAL HIGH (ref 70–99)
Potassium: 4.2 mmol/L (ref 3.5–5.1)
Sodium: 136 mmol/L (ref 135–145)
Total Bilirubin: 1.3 mg/dL — ABNORMAL HIGH (ref 0.0–1.2)
Total Protein: 6.4 g/dL — ABNORMAL LOW (ref 6.5–8.1)

## 2024-06-20 LAB — APTT: aPTT: 31 s (ref 24–36)

## 2024-06-20 LAB — SAVE SMEAR(SSMR), FOR PROVIDER SLIDE REVIEW
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
## 2024-06-21 LAB — KAPPA/LAMBDA LIGHT CHAINS
Kappa free light chain: 27.2 mg/L — ABNORMAL HIGH (ref 3.3–19.4)
Kappa, lambda light chain ratio: 0.07 — ABNORMAL LOW (ref 0.26–1.65)
Lambda free light chains: 391.1 mg/L — ABNORMAL HIGH (ref 5.7–26.3)

## 2024-06-21 LAB — IGG, IGA, IGM
IgA: 139 mg/dL (ref 64–422)
IgG (Immunoglobin G), Serum: 1019 mg/dL (ref 586–1602)
IgM (Immunoglobulin M), Srm: 119 mg/dL (ref 26–217)

## 2024-06-22 ENCOUNTER — Encounter: Payer: Self-pay | Admitting: Hematology & Oncology

## 2024-06-22 LAB — VON WILLEBRAND PANEL
Coagulation Factor VIII: 83 % (ref 56–140)
Ristocetin Co-factor, Plasma: 40 % — ABNORMAL LOW (ref 50–200)
Von Willebrand Antigen, Plasma: 115 % (ref 50–200)

## 2024-06-22 LAB — COAG STUDIES INTERP REPORT

## 2024-06-23 LAB — PROTEIN ELECTROPHORESIS, SERUM
A/G Ratio: 1.7 (ref 0.7–1.7)
Albumin ELP: 3.8 g/dL (ref 2.9–4.4)
Alpha-1-Globulin: 0.2 g/dL (ref 0.0–0.4)
Alpha-2-Globulin: 0.5 g/dL (ref 0.4–1.0)
Beta Globulin: 0.6 g/dL — ABNORMAL LOW (ref 0.7–1.3)
Gamma Globulin: 0.9 g/dL (ref 0.4–1.8)
Globulin, Total: 2.3 g/dL (ref 2.2–3.9)
Total Protein ELP: 6.1 g/dL (ref 6.0–8.5)

## 2024-06-26 ENCOUNTER — Ambulatory Visit: Payer: Self-pay | Admitting: Medical Oncology

## 2024-07-19 ENCOUNTER — Inpatient Hospital Stay (HOSPITAL_BASED_OUTPATIENT_CLINIC_OR_DEPARTMENT_OTHER): Admitting: Medical Oncology

## 2024-07-19 ENCOUNTER — Inpatient Hospital Stay: Attending: Medical Oncology

## 2024-07-19 ENCOUNTER — Encounter: Payer: Self-pay | Admitting: Medical Oncology

## 2024-07-19 VITALS — BP 128/82 | HR 84 | Temp 97.7°F | Resp 20 | Wt 156.0 lb

## 2024-07-19 DIAGNOSIS — D696 Thrombocytopenia, unspecified: Secondary | ICD-10-CM | POA: Insufficient documentation

## 2024-07-19 DIAGNOSIS — D509 Iron deficiency anemia, unspecified: Secondary | ICD-10-CM

## 2024-07-19 DIAGNOSIS — D472 Monoclonal gammopathy: Secondary | ICD-10-CM | POA: Diagnosis not present

## 2024-07-19 DIAGNOSIS — D68020 Von Willebrand disease, type 2a: Secondary | ICD-10-CM

## 2024-07-19 DIAGNOSIS — D62 Acute posthemorrhagic anemia: Secondary | ICD-10-CM

## 2024-07-19 DIAGNOSIS — Z882 Allergy status to sulfonamides status: Secondary | ICD-10-CM | POA: Insufficient documentation

## 2024-07-19 DIAGNOSIS — Z79899 Other long term (current) drug therapy: Secondary | ICD-10-CM | POA: Insufficient documentation

## 2024-07-19 DIAGNOSIS — R600 Localized edema: Secondary | ICD-10-CM | POA: Insufficient documentation

## 2024-07-19 LAB — CBC WITH DIFFERENTIAL (CANCER CENTER ONLY)
Abs Granulocyte: 5.8 K/uL (ref 1.5–6.5)
Abs Immature Granulocytes: 0.04 K/uL (ref 0.00–0.07)
Basophils Absolute: 0 K/uL (ref 0.0–0.1)
Basophils Relative: 1 %
Eosinophils Absolute: 0.1 K/uL (ref 0.0–0.5)
Eosinophils Relative: 1 %
HCT: 42.8 % (ref 36.0–46.0)
Hemoglobin: 14.1 g/dL (ref 12.0–15.0)
Immature Granulocytes: 1 %
Lymphocytes Relative: 11 %
Lymphs Abs: 0.8 K/uL (ref 0.7–4.0)
MCH: 31.5 pg (ref 26.0–34.0)
MCHC: 32.9 g/dL (ref 30.0–36.0)
MCV: 95.5 fL (ref 80.0–100.0)
Monocytes Absolute: 0.6 K/uL (ref 0.1–1.0)
Monocytes Relative: 8 %
Neutro Abs: 5.8 K/uL (ref 1.7–7.7)
Neutrophils Relative %: 78 %
Platelet Count: 68 K/uL — ABNORMAL LOW (ref 150–400)
RBC: 4.48 MIL/uL (ref 3.87–5.11)
RDW: 15.6 % — ABNORMAL HIGH (ref 11.5–15.5)
WBC Count: 7.4 K/uL (ref 4.0–10.5)
nRBC: 0 % (ref 0.0–0.2)

## 2024-07-19 LAB — CMP (CANCER CENTER ONLY)
ALT: 78 U/L — ABNORMAL HIGH (ref 0–44)
AST: 45 U/L — ABNORMAL HIGH (ref 15–41)
Albumin: 4.2 g/dL (ref 3.5–5.0)
Alkaline Phosphatase: 111 U/L (ref 38–126)
Anion gap: 15 (ref 5–15)
BUN: 22 mg/dL (ref 8–23)
CO2: 21 mmol/L — ABNORMAL LOW (ref 22–32)
Calcium: 9.3 mg/dL (ref 8.9–10.3)
Chloride: 103 mmol/L (ref 98–111)
Creatinine: 0.99 mg/dL (ref 0.44–1.00)
GFR, Estimated: 59 mL/min — ABNORMAL LOW (ref >60)
Glucose, Bld: 90 mg/dL (ref 70–99)
Potassium: 4.3 mmol/L (ref 3.5–5.1)
Sodium: 138 mmol/L (ref 135–145)
Total Bilirubin: 0.9 mg/dL (ref 0.0–1.2)
Total Protein: 6.5 g/dL (ref 6.5–8.1)

## 2024-07-19 LAB — APTT: aPTT: 31 s (ref 24–36)

## 2024-07-19 LAB — LACTATE DEHYDROGENASE: LDH: 226 U/L — ABNORMAL HIGH (ref 98–192)
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
## 2024-07-20 ENCOUNTER — Ambulatory Visit: Payer: Self-pay | Admitting: Medical Oncology

## 2024-07-20 LAB — KAPPA/LAMBDA LIGHT CHAINS
Kappa free light chain: 21.2 mg/L — ABNORMAL HIGH (ref 3.3–19.4)
Kappa, lambda light chain ratio: 0.06 — ABNORMAL LOW (ref 0.26–1.65)
Lambda free light chains: 353.7 mg/L — ABNORMAL HIGH (ref 5.7–26.3)

## 2024-07-24 LAB — MULTIPLE MYELOMA PANEL, SERUM
Albumin SerPl Elph-Mcnc: 3.4 g/dL (ref 2.9–4.4)
Albumin/Glob SerPl: 1.4 (ref 0.7–1.7)
Alpha 1: 0.3 g/dL (ref 0.0–0.4)
Alpha2 Glob SerPl Elph-Mcnc: 0.7 g/dL (ref 0.4–1.0)
B-Globulin SerPl Elph-Mcnc: 0.7 g/dL (ref 0.7–1.3)
Gamma Glob SerPl Elph-Mcnc: 0.9 g/dL (ref 0.4–1.8)
Globulin, Total: 2.6 g/dL (ref 2.2–3.9)
IgA: 123 mg/dL (ref 64–422)
IgG (Immunoglobin G), Serum: 939 mg/dL (ref 586–1602)
IgM (Immunoglobulin M), Srm: 110 mg/dL (ref 26–217)
Total Protein ELP: 6 g/dL (ref 6.0–8.5)

## 2024-07-25 LAB — COAG STUDIES INTERP REPORT

## 2024-07-25 LAB — VON WILLEBRAND PANEL
Coagulation Factor VIII: 119 % (ref 56–140)
Ristocetin Co-factor, Plasma: 228 % — ABNORMAL HIGH (ref 50–200)
Von Willebrand Antigen, Plasma: 166 % (ref 50–200)

## 2024-07-30 ENCOUNTER — Other Ambulatory Visit: Payer: Self-pay

## 2024-07-30 ENCOUNTER — Inpatient Hospital Stay (HOSPITAL_BASED_OUTPATIENT_CLINIC_OR_DEPARTMENT_OTHER)
Admission: EM | Admit: 2024-07-30 | Discharge: 2024-08-06 | DRG: 286 | Disposition: A | Discharge status: Home Health | Attending: Internal Medicine | Admitting: Internal Medicine

## 2024-07-30 ENCOUNTER — Encounter (HOSPITAL_BASED_OUTPATIENT_CLINIC_OR_DEPARTMENT_OTHER): Payer: Self-pay

## 2024-07-30 ENCOUNTER — Emergency Department (HOSPITAL_BASED_OUTPATIENT_CLINIC_OR_DEPARTMENT_OTHER)

## 2024-07-30 DIAGNOSIS — I5041 Acute combined systolic (congestive) and diastolic (congestive) heart failure: Secondary | ICD-10-CM

## 2024-07-30 DIAGNOSIS — I340 Nonrheumatic mitral (valve) insufficiency: Secondary | ICD-10-CM | POA: Diagnosis not present

## 2024-07-30 DIAGNOSIS — D649 Anemia, unspecified: Secondary | ICD-10-CM | POA: Diagnosis present

## 2024-07-30 DIAGNOSIS — I509 Heart failure, unspecified: Principal | ICD-10-CM

## 2024-07-30 DIAGNOSIS — Z91018 Allergy to other foods: Secondary | ICD-10-CM

## 2024-07-30 DIAGNOSIS — Z7189 Other specified counseling: Secondary | ICD-10-CM

## 2024-07-30 DIAGNOSIS — I5082 Biventricular heart failure: Secondary | ICD-10-CM | POA: Diagnosis present

## 2024-07-30 DIAGNOSIS — I5023 Acute on chronic systolic (congestive) heart failure: Secondary | ICD-10-CM | POA: Diagnosis not present

## 2024-07-30 DIAGNOSIS — D696 Thrombocytopenia, unspecified: Secondary | ICD-10-CM | POA: Diagnosis present

## 2024-07-30 DIAGNOSIS — D68020 Von Willebrand disease, type 2a: Secondary | ICD-10-CM | POA: Diagnosis present

## 2024-07-30 DIAGNOSIS — E785 Hyperlipidemia, unspecified: Secondary | ICD-10-CM | POA: Diagnosis present

## 2024-07-30 DIAGNOSIS — Z8249 Family history of ischemic heart disease and other diseases of the circulatory system: Secondary | ICD-10-CM | POA: Diagnosis not present

## 2024-07-30 DIAGNOSIS — M810 Age-related osteoporosis without current pathological fracture: Secondary | ICD-10-CM | POA: Diagnosis present

## 2024-07-30 DIAGNOSIS — D6800 Von Willebrand disease, unspecified: Secondary | ICD-10-CM | POA: Diagnosis present

## 2024-07-30 DIAGNOSIS — Z882 Allergy status to sulfonamides status: Secondary | ICD-10-CM | POA: Diagnosis not present

## 2024-07-30 DIAGNOSIS — Z79899 Other long term (current) drug therapy: Secondary | ICD-10-CM | POA: Diagnosis not present

## 2024-07-30 DIAGNOSIS — I2510 Atherosclerotic heart disease of native coronary artery without angina pectoris: Secondary | ICD-10-CM | POA: Diagnosis present

## 2024-07-30 DIAGNOSIS — I5021 Acute systolic (congestive) heart failure: Secondary | ICD-10-CM | POA: Diagnosis not present

## 2024-07-30 DIAGNOSIS — I255 Ischemic cardiomyopathy: Secondary | ICD-10-CM | POA: Diagnosis present

## 2024-07-30 DIAGNOSIS — I5020 Unspecified systolic (congestive) heart failure: Secondary | ICD-10-CM | POA: Diagnosis not present

## 2024-07-30 DIAGNOSIS — I2582 Chronic total occlusion of coronary artery: Secondary | ICD-10-CM | POA: Diagnosis present

## 2024-07-30 DIAGNOSIS — Z951 Presence of aortocoronary bypass graft: Secondary | ICD-10-CM | POA: Diagnosis not present

## 2024-07-30 DIAGNOSIS — Z7984 Long term (current) use of oral hypoglycemic drugs: Secondary | ICD-10-CM | POA: Diagnosis not present

## 2024-07-30 DIAGNOSIS — I5043 Acute on chronic combined systolic (congestive) and diastolic (congestive) heart failure: Secondary | ICD-10-CM | POA: Diagnosis present

## 2024-07-30 DIAGNOSIS — I110 Hypertensive heart disease with heart failure: Secondary | ICD-10-CM | POA: Diagnosis present

## 2024-07-30 DIAGNOSIS — I480 Paroxysmal atrial fibrillation: Secondary | ICD-10-CM

## 2024-07-30 DIAGNOSIS — I272 Pulmonary hypertension, unspecified: Secondary | ICD-10-CM | POA: Diagnosis present

## 2024-07-30 DIAGNOSIS — Z833 Family history of diabetes mellitus: Secondary | ICD-10-CM | POA: Diagnosis not present

## 2024-07-30 DIAGNOSIS — N179 Acute kidney failure, unspecified: Secondary | ICD-10-CM | POA: Diagnosis not present

## 2024-07-30 DIAGNOSIS — Z87442 Personal history of urinary calculi: Secondary | ICD-10-CM

## 2024-07-30 DIAGNOSIS — Z806 Family history of leukemia: Secondary | ICD-10-CM

## 2024-07-30 DIAGNOSIS — Z91041 Radiographic dye allergy status: Secondary | ICD-10-CM

## 2024-07-30 DIAGNOSIS — I50814 Right heart failure due to left heart failure: Secondary | ICD-10-CM | POA: Diagnosis not present

## 2024-07-30 HISTORY — DX: Calculus of kidney: N20.0

## 2024-07-30 LAB — COMPREHENSIVE METABOLIC PANEL WITH GFR
ALT: 86 U/L — ABNORMAL HIGH (ref 0–44)
AST: 65 U/L — ABNORMAL HIGH (ref 15–41)
Albumin: 4.1 g/dL (ref 3.5–5.0)
Alkaline Phosphatase: 121 U/L (ref 38–126)
Anion gap: 15 (ref 5–15)
BUN: 23 mg/dL (ref 8–23)
CO2: 20 mmol/L — ABNORMAL LOW (ref 22–32)
Calcium: 9.1 mg/dL (ref 8.9–10.3)
Chloride: 102 mmol/L (ref 98–111)
Creatinine, Ser: 0.92 mg/dL (ref 0.44–1.00)
GFR, Estimated: >60 mL/min (ref >60)
Glucose, Bld: 95 mg/dL (ref 70–99)
Potassium: 4.7 mmol/L (ref 3.5–5.1)
Sodium: 137 mmol/L (ref 135–145)
Total Bilirubin: 0.8 mg/dL (ref 0.0–1.2)
Total Protein: 6.4 g/dL — ABNORMAL LOW (ref 6.5–8.1)

## 2024-07-30 LAB — CBC
HCT: 43.9 % (ref 36.0–46.0)
Hemoglobin: 14.8 g/dL (ref 12.0–15.0)
MCH: 31.7 pg (ref 26.0–34.0)
MCHC: 33.7 g/dL (ref 30.0–36.0)
MCV: 94 fL (ref 80.0–100.0)
Platelets: 59 K/uL — ABNORMAL LOW (ref 150–400)
RBC: 4.67 MIL/uL (ref 3.87–5.11)
RDW: 15.2 % (ref 11.5–15.5)
WBC: 8.2 K/uL (ref 4.0–10.5)
nRBC: 0 % (ref 0.0–0.2)

## 2024-07-30 LAB — TROPONIN T, HIGH SENSITIVITY: Troponin T High Sensitivity: 44 ng/L — ABNORMAL HIGH (ref 0–19)

## 2024-07-30 LAB — PRO BRAIN NATRIURETIC PEPTIDE: Pro Brain Natriuretic Peptide: 15054 pg/mL — ABNORMAL HIGH (ref <300.0)

## 2024-07-30 MED ORDER — ACETAMINOPHEN 650 MG RE SUPP: 650.0000 mg | Freq: Four times a day (QID) | RECTAL | Status: DC | PRN

## 2024-07-30 MED ORDER — ACETAMINOPHEN 325 MG PO TABS: 650.0000 mg | ORAL_TABLET | ORAL | Status: DC | PRN

## 2024-07-30 MED ORDER — FUROSEMIDE 10 MG/ML IJ SOLN
40.0000 mg | Freq: Once | INTRAMUSCULAR | Status: AC
  Administered 2024-07-30: 40 mg via INTRAVENOUS
  Filled 2024-07-30: qty 4

## 2024-07-30 MED ORDER — ONDANSETRON HCL 4 MG PO TABS: 4.0000 mg | ORAL_TABLET | Freq: Four times a day (QID) | ORAL | Status: DC | PRN

## 2024-07-30 MED ORDER — SODIUM CHLORIDE 0.9 % IV SOLN: 250.0000 mL | INTRAVENOUS | Status: AC | PRN

## 2024-07-30 MED ORDER — SODIUM CHLORIDE 0.9% FLUSH
3.0000 mL | Freq: Two times a day (BID) | INTRAVENOUS | Status: DC
  Administered 2024-07-30 – 2024-08-05 (×11): 3 mL via INTRAVENOUS

## 2024-07-30 MED ORDER — SODIUM CHLORIDE 0.9% FLUSH
3.0000 mL | INTRAVENOUS | Status: DC | PRN
  Administered 2024-07-30: 3 mL via INTRAVENOUS

## 2024-07-30 MED ORDER — ACETAMINOPHEN 325 MG PO TABS: 650.0000 mg | ORAL_TABLET | Freq: Four times a day (QID) | ORAL | Status: DC | PRN

## 2024-07-30 MED ORDER — SORBITOL 70 % SOLN: 30.0000 mL | Freq: Every day | Status: DC | PRN

## 2024-07-30 MED ORDER — MELATONIN 3 MG PO TABS: 3.0000 mg | ORAL_TABLET | Freq: Every evening | ORAL | Status: DC | PRN

## 2024-07-30 MED ORDER — ONDANSETRON HCL 4 MG/2ML IJ SOLN
4.0000 mg | Freq: Four times a day (QID) | INTRAMUSCULAR | Status: DC | PRN
Start: 2024-07-30 — End: 2024-08-06

## 2024-07-30 MED ORDER — ONDANSETRON HCL 4 MG/2ML IJ SOLN: 4.0000 mg | Freq: Four times a day (QID) | INTRAMUSCULAR | Status: DC | PRN

## 2024-07-30 NOTE — ED Notes (Addendum)
 1830- This RN notified pt that she has a bed available at [facility]. Pt very upset, tearful. Pt had requested to go to [facility] due to her cardiologist/ outpatient medical care being through them. RN explained to pt that the bed came available faster at [facility] bc it is in our health system, and that she will have a significantly longer wait time if she waits to be placed at [facility]. Pt inquired about bed refusal. RN informed pt that she could be in ED for over 24 hrs if she refuses the bed, and that we do not have a cardiology team to see her here in the ED.  1840- Pt stated that she is willing to go to [facility]

## 2024-07-30 NOTE — ED Notes (Signed)
 ED Provider at bedside.

## 2024-07-30 NOTE — ED Provider Notes (Signed)
 [place] [facility] AT [facility] Provider Note   CSN: [id number] Arrival date & time: 07/30/24  1516     Patient presents with: Shortness of Breath   Meredith Dominguez is a 73 y.o. female.   This is a 73 year old female presenting emergency department with shortness of breath, chest tightness and lower extremity edema.  Reports over the past month she has noted increasing lower extremity edema to her bilateral legs.  Has gained over 20 pounds over the same duration.  Has had intermittent shortness of breath that has progressively worsened and more steady.  Symptoms seemingly worsened today.  Could not ambulate half her normal distance.  Does have some orthopnea.  Denies any cardiac history but notes that her primary doctor was working up her lower extremity edema   Shortness of Breath      Prior to Admission medications   Medication Sig Start Date End Date Taking? Authorizing Provider  calcium  carbonate (TUMS - DOSED IN MG ELEMENTAL CALCIUM ) 500 MG chewable tablet Chew 1 tablet (200 mg of elemental calcium  total) by mouth 3 (three) times daily. 05/30/24   Augustus Almarie RAMAN, PA-C  CALCIUM  LACTATE PO Take 3 tablets by mouth daily as needed (for bleeds).    [provider]  feeding supplement (ENSURE PLUS HIGH PROTEIN) LIQD Take 237 mLs by mouth 2 (two) times daily between meals. 05/30/24   Augustus Almarie RAMAN, PA-C  folic acid  (FOLVITE ) 1 MG tablet Take 2 tablets (2 mg total) by mouth daily. 05/31/24   Augustus Almarie RAMAN, PA-C  MAGNESIUM  CITRATE PO Take 150 mg by mouth daily.    [provider]  Misc Natural Products (ADRENAL PO) Take 1 tablet by mouth daily.    [provider]  Multiple Minerals-Vitamins (CALCIUM  & VIT D3 BONE HEALTH PO) Take 1 tablet by mouth in the morning, at noon, and at bedtime.    [provider]  NON FORMULARY Take 9 drops by mouth See admin instructions. Coxsackie 200C Bioactive Nutritional- Place 9  drops in the mouth a day as needed for heart health    [provider]  NON FORMULARY Take 9 drops by mouth See admin instructions. Stomachforce A  Bioactive Nutritional - Place 9 drops in the mouth once a day as needed for amoebas or parasites    [provider]  NON FORMULARY Take 2,775 mg by mouth See admin instructions. Calcium  bone support 925 mg - Take 2,775 mg by mouth once a day    [provider]  NON FORMULARY Take 1 capsule by mouth See admin instructions. For-Til B12 - Take 1 capsule by mouth once a day    [provider]  NONFORMULARY OR COMPOUNDED ITEM Take 1 capsule by mouth See admin instructions. Peak Thyroid  support - Take 1 capsule by mouth once a day    [provider]  NONFORMULARY OR COMPOUNDED ITEM See admin instructions. Digestin capsule - Take 1 capsule by mouth 2 times a day after a heavy meal    [provider]  NONFORMULARY OR COMPOUNDED ITEM Take 1-2 capsules by mouth See admin instructions. Gluten dairy digest capsules - Take 1-2 capsules by mouth once a day as needed to tolerate dairy    [provider]  NONFORMULARY OR COMPOUNDED ITEM Take 3 tablets by mouth See admin instructions. Cyruta Plus tablets - Take 3 tablets by mouth three times a day    [provider]  OVER THE COUNTER MEDICATION Take 1  tablet by mouth. HYDROXO B12 WITH FOLINIC ACID - Chew 1 tablet by mouth once a day as needed to combat Von Willebrand disease    [provider]  OVER THE COUNTER MEDICATION Take by mouth See admin instructions. Spleen thymus liquescence-  Take 0.5 dropperful by mouthl twice a day    [provider]  OVER THE COUNTER MEDICATION Take 5-10 drops by mouth See admin instructions. Infection EGA II -  Place 5-10 drops in the mouth three times a day as needed to improve the immune system and combat giardia    [provider]  OVER THE COUNTER MEDICATION daily. Ferro food.    [provider]  OVER THE COUNTER MEDICATION Take 1 tablet by mouth in the morning and at bedtime. For-Til B12 Supplement    [provider]  OVER THE COUNTER MEDICATION Take 1 tablet by mouth daily. PurDentix Oral Health    [provider]  UNABLE TO FIND Take 1 tablet by mouth daily. Med Name: Cyruta Plus    [provider]    Allergies: Monosodium glutamate, Red dye #40 (allura red), Gluten meal, Other, Sesame oil, Tomato, Wheat, Sulfa antibiotics, and Sulfa antibiotics    Review of Systems  Respiratory:  Positive for shortness of breath.     Updated Vital Signs BP 120/84   Pulse 86   Temp 98 F (36.7 C) (Oral)   Resp 16   Ht 5' 5 (1.651 m)   Wt 74.9 kg   SpO2 91%   BMI 27.48 kg/m   Physical Exam Vitals and nursing note reviewed.  Constitutional:      General: She is not in acute distress. Cardiovascular:     Rate and Rhythm: Normal rate.  Pulmonary:     Effort: Pulmonary effort is normal.     Comments: Crackles at bases Musculoskeletal:     Right lower leg: Edema present.     Left lower leg: Edema present.  Skin:    General: Skin is warm and dry.     Capillary Refill: Capillary refill takes less than 2 seconds.  Neurological:     General: No focal deficit present.  Psychiatric:        Mood and Affect: Mood normal.        Behavior: Behavior normal.     (all labs ordered are listed, but only abnormal results are displayed) Labs Reviewed  CBC - Abnormal; Notable for the following components:      Result Value   Platelets 59 (*)    All other components within normal limits  COMPREHENSIVE METABOLIC PANEL WITH GFR - Abnormal; Notable for the following components:   CO2 20 (*)    Total Protein 6.4 (*)    AST 65 (*)    ALT 86 (*)    All other components within normal limits  PRO BRAIN NATRIURETIC PEPTIDE - Abnormal; Notable for the following components:   Pro Brain Natriuretic Peptide 15,054.0 (*)    All other components within  normal limits  TROPONIN T, HIGH SENSITIVITY - Abnormal; Notable for the following components:   Troponin T High Sensitivity 44 (*)    All other components within normal limits    EKG: EKG Interpretation Date/Time:  Sunday July 30 2024 15:33:06 EDT Ventricular Rate:  92 PR Interval:  153 QRS Duration:  101 QT Interval:  381 QTC Calculation: 472 R Axis:   0  Text Interpretation: Sinus rhythm Probable left atrial enlargement Anterior infarct, old Confirmed by  Neysa Clap [phone]) on 07/30/2024 4:39:07 PM  Radiology: DG Chest 2 View Result Date: 07/30/2024 CLINICAL DATA:  Shortness of breath. EXAM: CHEST - 2 VIEW COMPARISON:  May 22, 2024. FINDINGS: Mild cardiomegaly is noted. Diffuse bilateral interstitial densities are noted consistent with pulmonary edema and bilateral pleural effusions. Bony thorax is unremarkable. IMPRESSION: Diffuse bilateral pulmonary edema and bilateral pleural effusions. Electronically Signed   By: Lynwood Landy Raddle M.D.   On: 07/30/2024 16:18     Procedures   Medications Ordered in the ED  furosemide  (LASIX ) injection 40 mg (40 mg Intravenous Given 07/30/24 1816)    Clinical Course as of 07/30/24 1820  Sun Jul 30, 2024  1524 [facility] review: LV ejection fraction = 25-30%. [TY]  1525 Saw PCP 8/26, per their note 1. Pedal edema 1+ pitting edema up to the bilateral distal thighs. Suggest she try getting thigh high compression socks with a compression level of 10 if she can find it. Discussed possible causes of her swelling. Encouraged elevation when sitting. If she doesn't see improvement we could do a trial of a diuretic. Due to her fatigue and dyspnea will order TTE for reassurance. Will get urinalysis to check for protein.  [TY]  1645 DG Chest 2 View IMPRESSION: Diffuse bilateral pulmonary edema and bilateral pleural effusions.   Electronically Signed   By: Lynwood Landy Raddle M.D.   On: 07/30/2024 16:18   [TY]  1811 Spoke  with Dr. Arthea who agrees to meet patient to hospital service. [TY]    Clinical Course User Index [TY] Neysa Clap PARAS, DO                                 Medical Decision Making This is a 73 year old female with complicated past medical history to include von Willebrand's, anemia, kidney stones who is presenting emergency department with symptoms concerning for CHF.  She is afebrile nontachycardic, normotensive.  Maintaining oxygen saturation on room air.  Does not appear to be in overt respiratory distress.  Chest x-ray with bilateral pleural effusions does appear to have some cardiomegaly on my independent review.  Her EKG without STEMI or ST segment changes to indicate ischemia.  She has a significantly elevated BNP at 15,000 and an elevated troponin of 44.  Comprehensive panel with normal kidney function, normal electrolytes.  Minor transaminitis noted.  No right upper quadrant abdominal pain on exam.  Per chart review had echo done recently which showed depressed EF to 25 to 30%.  She does not carry a diagnosis of CHF.  Will plan to admit for IV diuresis and further management.  Care discussed with hospitalist for admission  Amount and/or Complexity of Data Reviewed External Data Reviewed:     Details: See ED course Labs: ordered. Radiology: ordered and independent interpretation performed. Decision-making details documented in ED Course. ECG/medicine tests: ordered and independent interpretation performed. Decision-making details documented in ED Course.  Risk Prescription drug management. Decision regarding hospitalization. Diagnosis or treatment significantly limited by social determinants of health.       Final diagnoses:  Acute congestive heart failure, unspecified heart failure type [facility])    ED Discharge Orders     None          Neysa Clap PARAS, DO 07/30/24 1820
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
## 2024-07-30 NOTE — ED Notes (Signed)
 Called [facility] for transport to [facility] @18 :24.  Spoke with [employer]

## 2024-07-31 DIAGNOSIS — D68020 Von Willebrand disease, type 2a: Secondary | ICD-10-CM | POA: Diagnosis not present

## 2024-07-31 DIAGNOSIS — I5023 Acute on chronic systolic (congestive) heart failure: Secondary | ICD-10-CM | POA: Diagnosis not present

## 2024-07-31 DIAGNOSIS — D649 Anemia, unspecified: Secondary | ICD-10-CM | POA: Diagnosis not present

## 2024-07-31 LAB — CBC
HCT: 39.2 % (ref 36.0–46.0)
Hemoglobin: 12.8 g/dL (ref 12.0–15.0)
MCH: 30.4 pg (ref 26.0–34.0)
MCHC: 32.7 g/dL (ref 30.0–36.0)
MCV: 93.1 fL (ref 80.0–100.0)
Platelets: 56 K/uL — ABNORMAL LOW (ref 150–400)
RBC: 4.21 MIL/uL (ref 3.87–5.11)
RDW: 15.1 % (ref 11.5–15.5)
WBC: 5.5 K/uL (ref 4.0–10.5)
nRBC: 0 % (ref 0.0–0.2)

## 2024-07-31 LAB — BASIC METABOLIC PANEL WITH GFR
Anion gap: 9 (ref 5–15)
BUN: 18 mg/dL (ref 8–23)
CO2: 24 mmol/L (ref 22–32)
Calcium: 8.8 mg/dL — ABNORMAL LOW (ref 8.9–10.3)
Chloride: 106 mmol/L (ref 98–111)
Creatinine, Ser: 0.91 mg/dL (ref 0.44–1.00)
GFR, Estimated: >60 mL/min (ref >60)
Glucose, Bld: 80 mg/dL (ref 70–99)
Potassium: 4.5 mmol/L (ref 3.5–5.1)
Sodium: 139 mmol/L (ref 135–145)

## 2024-07-31 LAB — TSH: TSH: 5.777 u[IU]/mL — ABNORMAL HIGH (ref 0.350–4.500)

## 2024-07-31 LAB — MAGNESIUM: Magnesium: 1.8 mg/dL (ref 1.7–2.4)

## 2024-07-31 MED ORDER — SODIUM CHLORIDE 0.9 % IV SOLN
1.0000 g | INTRAVENOUS | Status: DC
  Filled 2024-07-31: qty 10

## 2024-07-31 MED ORDER — SPIRONOLACTONE 12.5 MG HALF TABLET
12.5000 mg | ORAL_TABLET | Freq: Every day | ORAL | Status: DC
  Administered 2024-08-01 – 2024-08-03 (×3): 12.5 mg via ORAL
  Filled 2024-07-31 (×4): qty 1

## 2024-07-31 MED ORDER — EMPAGLIFLOZIN 10 MG PO TABS
10.0000 mg | ORAL_TABLET | Freq: Every day | ORAL | Status: DC
  Administered 2024-08-01 – 2024-08-06 (×6): 10 mg via ORAL
  Filled 2024-07-31 (×7): qty 1

## 2024-07-31 MED ORDER — FUROSEMIDE 10 MG/ML IJ SOLN
60.0000 mg | Freq: Two times a day (BID) | INTRAMUSCULAR | Status: DC
  Administered 2024-07-31 – 2024-08-02 (×5): 60 mg via INTRAVENOUS
  Filled 2024-07-31 (×5): qty 6

## 2024-07-31 MED ORDER — SODIUM CHLORIDE 0.9 % IV SOLN
500.0000 mg | INTRAVENOUS | Status: DC
  Filled 2024-07-31: qty 5

## 2024-07-31 MED ORDER — LOSARTAN POTASSIUM 25 MG PO TABS
25.0000 mg | ORAL_TABLET | Freq: Every day | ORAL | Status: DC
  Filled 2024-07-31 (×2): qty 1

## 2024-07-31 NOTE — Assessment & Plan Note (Signed)
 09/04 echocardiogram ([facility]) with reduced LV systolic function 25 to 30%, mild to moderate global hypokinesis, with inferior lateral akinesis, RV with mild to moderate dilatation. RV systolic function with moderate to severe reduction, LA with mild dilatation, small anterior pericardial effusion.   Urine output 5,100  ml Systolic blood pressure 100 mmHg.   Plan to continue diuresis with IV furosemide  60 mg IV bid Medical therapy with losartan , spironolactone  and SGLT 2 inh.   Patient in agreement for further cardiac work up including cardiac catheterization.   Add 40 meq Kcl po and 2 g IV mag to avoid hypokalemia and hypomagnesemia.

## 2024-07-31 NOTE — Assessment & Plan Note (Signed)
Monitor for signs of bleeding
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
## 2024-07-31 NOTE — Evaluation (Signed)
 Physical Therapy Evaluation/ discharge Patient Details Name: Meredith Dominguez MRN: [id number] DOB: Jan 17, 1951 Today's Date: 07/31/2024  History of Present Illness  73 yo female adm 07/30/24 with SOB, chest tightness, acute CHF. PMhx: anemia, Von Willebrand's disease  Clinical Impression  Pt pleasant, eager to move and educated for daily weight, low sodium diet and walking program. Pt lives in independent living, walks to dining hall and uses rollator. Pt provided with HEP as noted and demonstrated 10 repeated sit to stands with progression to without UB support. All education complete with continued ambulation encouraged. No further acute needs and will defer to mobility specialists.  Access Code: [email or web address] URL: [email or web address] Date: 07/31/2024 Prepared by: Lenoard  Exercises - Sit to Stand Without Arm Support  - 3 x daily - 7 x weekly - 1 sets - 10 reps - Standing Hip Abduction  - 3 x daily - 7 x weekly - 1 sets - 10 reps - Standing Partial Squat  - 3 x daily - 7 x weekly - 1 sets - 10 reps - Standing Hip Flexion March  - 3 x daily - 7 x weekly - 1 sets - 10 reps        If plan is discharge home, recommend the following: Assistance with cooking/housework;Assist for transportation   Can travel by private vehicle        Equipment Recommendations None recommended by PT  Recommendations for Other Services       Functional Status Assessment Patient has not had a recent decline in their functional status     Precautions / Restrictions Precautions Precautions: Fall      Mobility  Bed Mobility               General bed mobility comments: in chair on arrival and end of session    Transfers Overall transfer level: Modified independent                 General transfer comment: reliant on UB support to power up and control descent, progression to without UB support over 10 trials    Ambulation/Gait Ambulation/Gait assistance: Modified  independent (Device/Increase time) Gait Distance (Feet): 500 Feet Assistive device: Rollator (4 wheels) Gait Pattern/deviations: Step-through pattern, Decreased stride length   Gait velocity interpretation: >2.62 ft/sec, indicative of community ambulatory      Stairs            Wheelchair Mobility     Tilt Bed    Modified Rankin (Stroke Patients Only)       Balance Overall balance assessment: Mild deficits observed, not formally tested                                           Pertinent Vitals/Pain Pain Assessment Pain Assessment: No/denies pain    Home Living Family/patient expects to be discharged to:: Private residence Living Arrangements: Alone Available Help at Discharge: Family;Friend(s);Available PRN/intermittently Type of Home: Independent living facility Home Access: Level entry       Home Layout: One level Home Equipment: Grab bars - tub/shower;Other (comment);Rollator (4 wheels);Transport chair (walking stick)      Prior Function Prior Level of Function : Independent/Modified Independent             Mobility Comments: rollator to dining hall ADLs Comments: PCA 2 days a week, does her own laundry     Extremity/Trunk Assessment   Upper  Extremity Assessment Upper Extremity Assessment: Generalized weakness    Lower Extremity Assessment Lower Extremity Assessment: Generalized weakness    Cervical / Trunk Assessment Cervical / Trunk Assessment: Normal  Communication   Communication Communication: No apparent difficulties    Cognition Arousal: Alert Behavior During Therapy: WFL for tasks assessed/performed   PT - Cognitive impairments: No apparent impairments                                 Cueing       General Comments      Exercises     Assessment/Plan    PT Assessment Patient does not need any further PT services  PT Problem List         PT Treatment Interventions      PT Goals  (Current goals can be found in the Care Plan section)  Acute Rehab PT Goals PT Goal Formulation: All assessment and education complete, DC therapy    Frequency       Co-evaluation               AM-PAC PT 6 Clicks Mobility  Outcome Measure Help needed turning from your back to your side while in a flat bed without using bedrails?: None Help needed moving from lying on your back to sitting on the side of a flat bed without using bedrails?: None Help needed moving to and from a bed to a chair (including a wheelchair)?: None Help needed standing up from a chair using your arms (e.g., wheelchair or bedside chair)?: None Help needed to walk in hospital room?: None Help needed climbing 3-5 steps with a railing? : A Little 6 Click Score: 23    End of Session   Activity Tolerance: Patient tolerated treatment well Patient left: in chair;with call bell/phone within reach Nurse Communication: Mobility status PT Visit Diagnosis: Other abnormalities of gait and mobility (R26.89)    Time: 9079-9052 PT Time Calculation (min) (ACUTE ONLY): 27 min   Charges:   PT Evaluation $PT Eval Low Complexity: 1 Low PT Treatments $Therapeutic Activity: 8-22 mins PT General Charges $$ ACUTE PT VISIT: 1 Visit         Lenoard SQUIBB, PT Acute Rehabilitation Services Office: [phone]   Belen Pesch B Alida Greiner 07/31/2024, 10:14 AM

## 2024-07-31 NOTE — Progress Notes (Signed)
 Meredith Dominguez   DOB:04-22-1951   FM#:[id number]      ASSESSMENT & PLAN:  Meredith Dominguez is a 73 year old female patient with medical history significant for vWD and elevated lambda light chain. She was admitted on 07/30/24 with complaints of shortness of breath and edema to lower extremities.  Hematology/Dr. Timmy following.  Shortness of breath Edema bil LE Acute CHF, newly diagnosed --Admitted 9/7 with c/o SOB and bil LE edema.  --Status post IV lasix  given 9/7 --Echo done 9/4, EF 25-30% --Recommend Cardio/HF eval --Monitor respiratory status closely  vonWillebrand's disease --Diagnosed with Type IIa vWD.   --Has received Humate-P  factor replacement in the past. May need prior to invasive procedures.  -- Ordered fobt as patient states she thinks she had small amount of blood in stool.  --Monitor for bleeding --Hematology/Dr. Timmy following. Will continue to follow as outpatient.  Elevated light chains/Abnormal proteins --Labs show elevated kappa and lambda FLC. No M-spike observed.  --Dr. Ennever/hematology following   Thrombocytopenia --Platelets low 56K today  --Platelet transfusion for counts <20K or <50K with active bleeding.  --No transfusional requirements at this time. --Monitor CBC with differential  History of iron deficiency anemia -- Received IV iron/ferrlicit in the past -- Hemoglobin stable 12.8 today -- Continue to monitor CBC with differential      Code Status Full   Subjective:  Patient seen awake and alert sitting up in chair at bedside.  Reports that she feels okay.  Denies diarrhea abdominal pain.  Admits to some abdominal discomfort, states she has tapeworms from time to time.  States that she had small amount of blood in stool but no blood in her urine.  No other acute complaints offered at this time.  Objective:   Intake/Output Summary (Last 24 hours) at 07/31/2024 1245 Last data filed at 07/31/2024 0840 Gross per 24 hour  Intake  209 ml  Output 1450 ml  Net -1241 ml     PHYSICAL EXAMINATION: ECOG PERFORMANCE STATUS: 2 - Symptomatic, <50% confined to bed  Vitals:   07/31/24 0738 07/31/24 1123  BP: 118/75 125/81  Pulse:  83  Resp: 16   Temp: 97.9 F (36.6 C) (!) 97.4 F (36.3 C)  SpO2:     Filed Weights   07/30/24 1536 07/30/24 2029 07/31/24 0500  Weight: 165 lb 2 oz (74.9 kg) 166 lb 7.2 oz (75.5 kg) 150 lb 9.2 oz (68.3 kg)    GENERAL: alert, no distress and comfortable SKIN: +pale skin color, texture, turgor are normal, no rashes or significant lesions EYES: normal, conjunctiva are pink and non-injected, sclera clear OROPHARYNX: no exudate, no erythema and lips, buccal mucosa, and tongue normal  NECK: supple, thyroid  normal size, non-tender, without nodularity LYMPH: no palpable lymphadenopathy in the cervical, axillary or inguinal LUNGS: clear to auscultation and percussion with normal breathing effort HEART: regular rate & rhythm and no murmurs and no lower extremity edema ABDOMEN: abdomen soft, non-tender and normal bowel sounds MUSCULOSKELETAL: no cyanosis of digits and no clubbing  PSYCH: alert & oriented x 3 with fluent speech NEURO: no focal motor/sensory deficits   All questions were answered. The patient knows to call the clinic with any problems, questions or concerns.   The total time spent in the appointment was 40 minutes encounter with patient including review of chart and various tests results, discussions about plan of care and coordination of care plan  Meredith JINNY Brunner, NP 07/31/2024 12:45 PM    Labs Reviewed:  Lab  Results  Component Value Date   WBC 5.5 07/31/2024   HGB 12.8 07/31/2024   HCT 39.2 07/31/2024   MCV 93.1 07/31/2024   PLT 56 (L) 07/31/2024   Recent Labs    06/20/24 0951 07/19/24 1101 07/30/24 1649 07/31/24 0404  NA 136 138 137 139  K 4.2 4.3 4.7 4.5  CL 103 103 102 106  CO2 21* 21* 20* 24  GLUCOSE 101* 90 95 80  BUN 21 22 23 18   CREATININE 0.87 0.99  0.92 0.91  CALCIUM  9.0 9.3 9.1 8.8*  GFRNONAA >60 59* >60 >60  PROT 6.4* 6.5 6.4*  --   ALBUMIN 4.0 4.2 4.1  --   AST 18 45* 65*  --   ALT 9 78* 86*  --   ALKPHOS 112 111 121  --   BILITOT 1.3* 0.9 0.8  --     Studies Reviewed:  DG Chest 2 View Result Date: 07/30/2024 CLINICAL DATA:  Shortness of breath. EXAM: CHEST - 2 VIEW COMPARISON:  May 22, 2024. FINDINGS: Mild cardiomegaly is noted. Diffuse bilateral interstitial densities are noted consistent with pulmonary edema and bilateral pleural effusions. Bony thorax is unremarkable. IMPRESSION: Diffuse bilateral pulmonary edema and bilateral pleural effusions. Electronically Signed   By: Lynwood Landy Raddle M.D.   On: 07/30/2024 16:18
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
## 2024-07-31 NOTE — Plan of Care (Signed)
 Pt refused Jardiance , and spironolactone  because they thought it would be taking even more fluids, and she is not used to lots of pharmaceuticals, and was feeling cautious about side effects.    Problem: Clinical Measurements: Goal: Respiratory complications will improve Outcome: Progressing

## 2024-07-31 NOTE — Progress Notes (Signed)
 Heart Failure Navigator Progress Note  Assessed for Heart & Vascular TOC clinic readiness.  Patient does not meet criteria due to patient is seen by [facility]. No HF TOC. .   Navigator will sign off at this time.   Randie Bustle, BSN, Scientist, clinical (histocompatibility and immunogenetics) Only

## 2024-07-31 NOTE — Assessment & Plan Note (Signed)
 Thrombocytopenia  Continue close cell count monitoring   Iron deficiency, she had IV iron in the past.
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
## 2024-08-01 ENCOUNTER — Encounter: Payer: Self-pay | Admitting: Hematology & Oncology

## 2024-08-01 ENCOUNTER — Encounter (HOSPITAL_COMMUNITY): Payer: Self-pay | Admitting: Internal Medicine

## 2024-08-01 ENCOUNTER — Telehealth (HOSPITAL_COMMUNITY): Payer: Self-pay | Admitting: Pharmacy Technician

## 2024-08-01 ENCOUNTER — Other Ambulatory Visit (HOSPITAL_COMMUNITY): Payer: Self-pay

## 2024-08-01 DIAGNOSIS — D696 Thrombocytopenia, unspecified: Secondary | ICD-10-CM | POA: Diagnosis not present

## 2024-08-01 DIAGNOSIS — I5023 Acute on chronic systolic (congestive) heart failure: Secondary | ICD-10-CM

## 2024-08-01 DIAGNOSIS — I5041 Acute combined systolic (congestive) and diastolic (congestive) heart failure: Secondary | ICD-10-CM | POA: Diagnosis not present

## 2024-08-01 DIAGNOSIS — D68020 Von Willebrand disease, type 2a: Secondary | ICD-10-CM

## 2024-08-01 DIAGNOSIS — D649 Anemia, unspecified: Secondary | ICD-10-CM | POA: Diagnosis not present

## 2024-08-01 DIAGNOSIS — Z7189 Other specified counseling: Secondary | ICD-10-CM

## 2024-08-01 LAB — COMPREHENSIVE METABOLIC PANEL WITH GFR
ALT: 45 U/L — ABNORMAL HIGH (ref 0–44)
AST: 21 U/L (ref 15–41)
Albumin: 3.2 g/dL — ABNORMAL LOW (ref 3.5–5.0)
Alkaline Phosphatase: 77 U/L (ref 38–126)
Anion gap: 13 (ref 5–15)
BUN: 27 mg/dL — ABNORMAL HIGH (ref 8–23)
CO2: 25 mmol/L (ref 22–32)
Calcium: 8.8 mg/dL — ABNORMAL LOW (ref 8.9–10.3)
Chloride: 100 mmol/L (ref 98–111)
Creatinine, Ser: 1.1 mg/dL — ABNORMAL HIGH (ref 0.44–1.00)
GFR, Estimated: 52 mL/min — ABNORMAL LOW (ref >60)
Glucose, Bld: 85 mg/dL (ref 70–99)
Potassium: 3.5 mmol/L (ref 3.5–5.1)
Sodium: 138 mmol/L (ref 135–145)
Total Bilirubin: 1.2 mg/dL (ref 0.0–1.2)
Total Protein: 5.6 g/dL — ABNORMAL LOW (ref 6.5–8.1)

## 2024-08-01 LAB — MAGNESIUM: Magnesium: 1.9 mg/dL (ref 1.7–2.4)

## 2024-08-01 LAB — T4, FREE: Free T4: 1.6 ng/dL — ABNORMAL HIGH (ref 0.61–1.12)

## 2024-08-01 MED ORDER — MAGNESIUM SULFATE 2 GM/50ML IV SOLN
2.0000 g | Freq: Once | INTRAVENOUS | Status: AC
Start: 2024-08-01 — End: 2024-08-01
  Administered 2024-08-01: 2 g via INTRAVENOUS
  Filled 2024-08-01: qty 50

## 2024-08-01 MED ORDER — POTASSIUM CHLORIDE CRYS ER 20 MEQ PO TBCR
40.0000 meq | EXTENDED_RELEASE_TABLET | Freq: Once | ORAL | Status: AC
  Administered 2024-08-01: 40 meq via ORAL
  Filled 2024-08-01: qty 2

## 2024-08-01 MED ORDER — ASPIRIN 81 MG PO CHEW
81.0000 mg | CHEWABLE_TABLET | ORAL | Status: AC
  Administered 2024-08-02: 81 mg via ORAL
  Filled 2024-08-01: qty 1

## 2024-08-01 MED ORDER — FREE WATER
250.0000 mL | Freq: Once | Status: AC
  Administered 2024-08-02: 250 mL via ORAL

## 2024-08-01 NOTE — Progress Notes (Signed)
 Meredith Dominguez certainly does look quite good.  She is diuresed quite a bit.  She had 4200 cc of fluid out yesterday.  So far this morning, she has had 1400 cc of fluid removed.  She has very little edema in the legs.  She has [facility].  She really wants to see cardiology in [facility] for her heart failure.  Please have cardiology come back to see her and see about the heart failure issues.  From her von Willebrand's point of view, I have no problems with her getting cardiac cath.  If she needs cardiac stents that is okay.  I wonder if she may not have cardiac amyloid.  She has always had some elevation of her lambda light chain.  She is always refused to have any evaluation for this with a bone marrow test.  I am going to get a 24-hour urine on her while she is here.  From my point of view, I do not see any problems with her being on medications for the cardiac failure.  Her BUN is 27 creatinine 1.1.  Calcium  8.8.  The TSH is slightly elevated at 5.8.  Her pro BNP is 15,000.  I know she is worried about bleeding.  Again, I really think that we had to be aggressive with her cardiac issues.  Again, we can manage the von Willebrand.  Her appetite is okay.  She has had no nausea or vomiting.  She has had no fever.  She has had no bleeding.  She has had no change in bowel or bladder habits.   Her vital signs are all stable.  Temperature 98.3.  Pulse 78.  Blood pressure 99/63.  Her lungs sound relatively clear bilaterally.  Cardiac exam regular rate and rhythm.  I do not hear any murmur.  Abdominal exam is soft.  Bowel sounds are present.  There is no fluid wave.  There is no palpable liver or spleen tip.  Extremities shows some trace edema in the lower legs.  Neurological exam is nonfocal.  Again, she wants to have cardiology in [facility] to manage the congestive heart failure.  Please have them come back again and set her up with the heart failure clinic.   Again, if she needs a cardiac cath, I do not see a problem with this.  We will follow along.   Jeralyn Crease, MD   Psalm 6:2
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
## 2024-08-01 NOTE — Progress Notes (Signed)
 Mobility Specialist Progress Note:    08/01/24 1215  Mobility  Activity Ambulated with assistance  Level of Assistance Standby assist, set-up cues, supervision of patient - no hands on  Assistive Device Four wheel walker  Distance Ambulated (ft) 500 ft  Activity Response Tolerated well  Mobility Referral Yes  Mobility visit 1 Mobility  Mobility Specialist Start Time (ACUTE ONLY) 1215  Mobility Specialist Stop Time (ACUTE ONLY) 1249  Mobility Specialist Time Calculation (min) (ACUTE ONLY) 34 min   Pt pleasant and agreeable to session. No c/o any symptoms. Pt able to move and ambulate w/ no assist. Returned pt to EOB w/ all needs met.   Venetia Keel Mobility Specialist Please Neurosurgeon or Rehab Office at [phone]
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
## 2024-08-01 NOTE — Telephone Encounter (Signed)
 Patient Product/process development scientist completed.    The patient is insured through [employer]. Patient has Medicare and is not eligible for a copay card, but may be able to apply for patient assistance or Medicare RX Payment Plan (Patient Must reach out to their plan, if eligible for payment plan), if available.    Ran test claim for Jardiance  10 mg and the current 30 day co-pay is $297.00 due to a $250.00 deductible.  Will be $47.00 once deductible is met.   This test claim was processed through [facility]- copay amounts may vary at other pharmacies due to pharmacy/plan contracts, or as the patient moves through the different stages of their insurance plan.     Reyes Sharps, CPHT Pharmacy Technician III Certified Patient Advocate [facility] Patient Advocate Team Direct Number: [phone]  Fax: [phone]
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
## 2024-08-02 ENCOUNTER — Inpatient Hospital Stay (HOSPITAL_COMMUNITY)
Admission: EM | Disposition: A | Discharge status: Home Health | Payer: Self-pay | Source: Home / Self Care | Attending: Internal Medicine

## 2024-08-02 DIAGNOSIS — I5041 Acute combined systolic (congestive) and diastolic (congestive) heart failure: Secondary | ICD-10-CM | POA: Diagnosis not present

## 2024-08-02 DIAGNOSIS — I5023 Acute on chronic systolic (congestive) heart failure: Secondary | ICD-10-CM | POA: Diagnosis not present

## 2024-08-02 DIAGNOSIS — I2510 Atherosclerotic heart disease of native coronary artery without angina pectoris: Secondary | ICD-10-CM | POA: Diagnosis not present

## 2024-08-02 DIAGNOSIS — D68020 Von Willebrand disease, type 2a: Secondary | ICD-10-CM | POA: Diagnosis not present

## 2024-08-02 LAB — CBC
HCT: 43.6 % (ref 36.0–46.0)
HCT: 44.4 % (ref 36.0–46.0)
Hemoglobin: 14.7 g/dL (ref 12.0–15.0)
Hemoglobin: 14.9 g/dL (ref 12.0–15.0)
MCH: 30.7 pg (ref 26.0–34.0)
MCH: 30.7 pg (ref 26.0–34.0)
MCHC: 33.7 g/dL (ref 30.0–36.0)
MCHC: 33.6 g/dL (ref 30.0–36.0)
MCV: 91 fL (ref 80.0–100.0)
MCV: 91.5 fL (ref 80.0–100.0)
Platelets: 60 K/uL — ABNORMAL LOW (ref 150–400)
Platelets: 65 K/uL — ABNORMAL LOW (ref 150–400)
RBC: 4.79 MIL/uL (ref 3.87–5.11)
RBC: 4.85 MIL/uL (ref 3.87–5.11)
RDW: 14.8 % (ref 11.5–15.5)
RDW: 14.6 % (ref 11.5–15.5)
WBC: 6.3 K/uL (ref 4.0–10.5)
WBC: 8.1 K/uL (ref 4.0–10.5)
nRBC: 0 % (ref 0.0–0.2)
nRBC: 0 % (ref 0.0–0.2)

## 2024-08-02 LAB — BASIC METABOLIC PANEL WITH GFR
Anion gap: 15 (ref 5–15)
BUN: 30 mg/dL — ABNORMAL HIGH (ref 8–23)
CO2: 24 mmol/L (ref 22–32)
Calcium: 8.5 mg/dL — ABNORMAL LOW (ref 8.9–10.3)
Chloride: 98 mmol/L (ref 98–111)
Creatinine, Ser: 1.14 mg/dL — ABNORMAL HIGH (ref 0.44–1.00)
GFR, Estimated: 50 mL/min — ABNORMAL LOW (ref >60)
Glucose, Bld: 77 mg/dL (ref 70–99)
Potassium: 3.8 mmol/L (ref 3.5–5.1)
Sodium: 137 mmol/L (ref 135–145)

## 2024-08-02 LAB — COMPREHENSIVE METABOLIC PANEL WITH GFR
ALT: 32 U/L (ref 0–44)
AST: 21 U/L (ref 15–41)
Albumin: 3.6 g/dL (ref 3.5–5.0)
Alkaline Phosphatase: 80 U/L (ref 38–126)
Anion gap: 15 (ref 5–15)
BUN: 34 mg/dL — ABNORMAL HIGH (ref 8–23)
CO2: 28 mmol/L (ref 22–32)
Calcium: 8.9 mg/dL (ref 8.9–10.3)
Chloride: 96 mmol/L — ABNORMAL LOW (ref 98–111)
Creatinine, Ser: 1.6 mg/dL — ABNORMAL HIGH (ref 0.44–1.00)
GFR, Estimated: 33 mL/min — ABNORMAL LOW (ref >60)
Glucose, Bld: 120 mg/dL — ABNORMAL HIGH (ref 70–99)
Potassium: 3.6 mmol/L (ref 3.5–5.1)
Sodium: 139 mmol/L (ref 135–145)
Total Bilirubin: 1.1 mg/dL (ref 0.0–1.2)
Total Protein: 6.1 g/dL — ABNORMAL LOW (ref 6.5–8.1)

## 2024-08-02 LAB — POCT I-STAT 7, (LYTES, BLD GAS, ICA,H+H)
Acid-Base Excess: 5 mmol/L — ABNORMAL HIGH (ref 0.0–2.0)
Bicarbonate: 29.4 mmol/L — ABNORMAL HIGH (ref 20.0–28.0)
Calcium, Ion: 1.14 mmol/L — ABNORMAL LOW (ref 1.15–1.40)
HCT: 43 % (ref 36.0–46.0)
Hemoglobin: 14.6 g/dL (ref 12.0–15.0)
O2 Saturation: 91 %
Potassium: 3.8 mmol/L (ref 3.5–5.1)
Sodium: 137 mmol/L (ref 135–145)
TCO2: 31 mmol/L (ref 22–32)
pCO2 arterial: 40.4 mmHg (ref 32–48)
pH, Arterial: 7.471 — ABNORMAL HIGH (ref 7.35–7.45)
pO2, Arterial: 58 mmHg — ABNORMAL LOW (ref 83–108)

## 2024-08-02 LAB — POCT I-STAT EG7
Acid-Base Excess: 6 mmol/L — ABNORMAL HIGH (ref 0.0–2.0)
Acid-Base Excess: 7 mmol/L — ABNORMAL HIGH (ref 0.0–2.0)
Bicarbonate: 31 mmol/L — ABNORMAL HIGH (ref 20.0–28.0)
Bicarbonate: 32 mmol/L — ABNORMAL HIGH (ref 20.0–28.0)
Calcium, Ion: 1.11 mmol/L — ABNORMAL LOW (ref 1.15–1.40)
Calcium, Ion: 1.13 mmol/L — ABNORMAL LOW (ref 1.15–1.40)
HCT: 44 % (ref 36.0–46.0)
HCT: 44 % (ref 36.0–46.0)
Hemoglobin: 15 g/dL (ref 12.0–15.0)
Hemoglobin: 15 g/dL (ref 12.0–15.0)
O2 Saturation: 72 %
O2 Saturation: 73 %
Potassium: 3.8 mmol/L (ref 3.5–5.1)
Potassium: 3.8 mmol/L (ref 3.5–5.1)
Sodium: 137 mmol/L (ref 135–145)
Sodium: 138 mmol/L (ref 135–145)
TCO2: 32 mmol/L (ref 22–32)
TCO2: 33 mmol/L — ABNORMAL HIGH (ref 22–32)
pCO2, Ven: 43.7 mmHg — ABNORMAL LOW (ref 44–60)
pCO2, Ven: 44.8 mmHg (ref 44–60)
pH, Ven: 7.46 — ABNORMAL HIGH (ref 7.25–7.43)
pH, Ven: 7.463 — ABNORMAL HIGH (ref 7.25–7.43)
pO2, Ven: 36 mmHg (ref 32–45)
pO2, Ven: 37 mmHg (ref 32–45)

## 2024-08-02 LAB — KAPPA/LAMBDA LIGHT CHAINS
Kappa free light chain: 22.1 mg/L — ABNORMAL HIGH (ref 3.3–19.4)
Kappa free light chain: 25.7 mg/L — ABNORMAL HIGH (ref 3.3–19.4)
Kappa, lambda light chain ratio: 0.06 — ABNORMAL LOW (ref 0.26–1.65)
Kappa, lambda light chain ratio: 0.06 — ABNORMAL LOW (ref 0.26–1.65)
Lambda free light chains: 351.5 mg/L — ABNORMAL HIGH (ref 5.7–26.3)
Lambda free light chains: 408.6 mg/L — ABNORMAL HIGH (ref 5.7–26.3)

## 2024-08-02 LAB — LACTIC ACID, PLASMA: Lactic Acid, Venous: 1.7 mmol/L (ref 0.5–1.9)

## 2024-08-02 LAB — MAGNESIUM: Magnesium: 2.1 mg/dL (ref 1.7–2.4)

## 2024-08-02 LAB — BETA 2 MICROGLOBULIN, SERUM: Beta-2 Microglobulin: 2.6 mg/L — ABNORMAL HIGH (ref 0.6–2.4)

## 2024-08-02 SURGERY — RIGHT/LEFT HEART CATH AND CORONARY ANGIOGRAPHY
Anesthesia: LOCAL

## 2024-08-02 MED ORDER — VERAPAMIL HCL 2.5 MG/ML IV SOLN
INTRAVENOUS | Status: AC
  Filled 2024-08-02: qty 2

## 2024-08-02 MED ORDER — SODIUM CHLORIDE 0.9% FLUSH: 3.0000 mL | INTRAVENOUS | Status: DC | PRN

## 2024-08-02 MED ORDER — HEPARIN SODIUM (PORCINE) 1000 UNIT/ML IJ SOLN
INTRAMUSCULAR | Status: AC
  Filled 2024-08-02: qty 10

## 2024-08-02 MED ORDER — SODIUM CHLORIDE 0.9 % IV SOLN: 250.0000 mL | INTRAVENOUS | Status: AC | PRN

## 2024-08-02 MED ORDER — MIDAZOLAM HCL 2 MG/2ML IJ SOLN
INTRAMUSCULAR | Status: DC | PRN
  Administered 2024-08-02: 1 mg via INTRAVENOUS

## 2024-08-02 MED ORDER — LIDOCAINE HCL (PF) 1 % IJ SOLN
INTRAMUSCULAR | Status: DC | PRN
  Administered 2024-08-02: 5 mL via INTRADERMAL

## 2024-08-02 MED ORDER — HEPARIN (PORCINE) IN NACL 1000-0.9 UT/500ML-% IV SOLN
INTRAVENOUS | Status: DC | PRN
  Administered 2024-08-02 (×2): 500 mL

## 2024-08-02 MED ORDER — FENTANYL CITRATE (PF) 100 MCG/2ML IJ SOLN
INTRAMUSCULAR | Status: DC | PRN
  Administered 2024-08-02: 25 ug via INTRAVENOUS

## 2024-08-02 MED ORDER — MIDAZOLAM HCL 2 MG/2ML IJ SOLN
INTRAMUSCULAR | Status: AC
  Filled 2024-08-02: qty 2

## 2024-08-02 MED ORDER — FENTANYL CITRATE (PF) 100 MCG/2ML IJ SOLN
INTRAMUSCULAR | Status: AC
  Filled 2024-08-02: qty 2

## 2024-08-02 MED ORDER — IOHEXOL 350 MG/ML SOLN
INTRAVENOUS | Status: AC | PRN
  Administered 2024-08-02: 40 mL

## 2024-08-02 MED ORDER — LIDOCAINE HCL (PF) 1 % IJ SOLN
INTRAMUSCULAR | Status: AC
  Filled 2024-08-02: qty 30

## 2024-08-02 MED ORDER — HEPARIN SODIUM (PORCINE) 1000 UNIT/ML IJ SOLN
INTRAMUSCULAR | Status: DC | PRN
  Administered 2024-08-02: 3000 [IU] via INTRAVENOUS

## 2024-08-02 SURGICAL SUPPLY — 8 items
CATH BALLN WEDGE 5F 110CM (CATHETERS) IMPLANT
CATH INFINITI AMBI 5FR TG (CATHETERS) IMPLANT
DEVICE RAD COMP TR BAND LRG (VASCULAR PRODUCTS) IMPLANT
GLIDESHEATH SLEND A-KIT 6F 22G (SHEATH) IMPLANT
GUIDEWIRE INQWIRE 1.5J.035X260 (WIRE) IMPLANT
PACK CARDIAC CATHETERIZATION (CUSTOM PROCEDURE TRAY) ×1 IMPLANT
SET ATX-X65L (MISCELLANEOUS) IMPLANT
SHEATH GLIDE SLENDER 4/5FR (SHEATH) IMPLANT

## 2024-08-02 NOTE — Interval H&P Note (Signed)
 History and Physical Interval Note:  08/02/2024 2:41 PM  Meredith Dominguez  has presented today for surgery, with the diagnosis of heart failure.  The various methods of treatment have been discussed with the patient and family. After consideration of risks, benefits and other options for treatment, the patient has consented to  Procedure(s): RIGHT/LEFT HEART CATH AND CORONARY ANGIOGRAPHY (N/A) and possible coronary intervention as a surgical intervention.  The patient's history has been reviewed, patient examined, no change in status, stable for surgery.  I have reviewed the patient's chart and labs.  Questions were answered to the patient's satisfaction.     Gordy Bergamo

## 2024-08-02 NOTE — Progress Notes (Signed)
 Cardiology saw her yesterday.  Very much appreciate their incredible recommendations.  I certainly would have no problems with a cardiac cath.  I think we have to figure out why she has congestive heart failure.  I would not think that bleeding should be a problem.  She does have some mild thrombocytopenia.  If she does need to have a coronary artery stent, I really do not see a problem with this and with her being on antiplatelet agents.  I know she is very worried about this.  I told her that if she has problems with her heart, that her von Willebrand's would be a nonfactor.  Again she will have her cardiac cath today..  She is diuresed incredibly well.  Her weight is come down very nicely.  She is very happy about this.  We did do a 24-hour collection on her for the urine.  We will see what her light chain production is.  Again I just wonder if her underlying plasma cell disorder might be a factor.  We have not done a bone marrow biopsy on her.  I suspect this probably is going need to be done.  I sent off myeloma studies.  They are not back yet.  We will have to see what the cardiac cath shows.  This is critical.  Again, what ever needs to be done cardiac wise, I do not see a problem with this.  Again, I do appreciate the incredible care she is getting from everybody up on [address].   Jeralyn Crease, MD  Psalm 147:3

## 2024-08-02 NOTE — H&P (View-Only) (Signed)
   Rounding Note    Patient Name: Meredith Dominguez Date of Encounter: 08/02/2024  [facility] Cardiologist: Shelda Bruckner, MD   Subjective   No acute events overnight. Reviewed plans for [facility] today, what these procedures are, what they will show us . Again discussed that ideally we will not have to place a stent, which would be difficult to manage with her vWF and chronic thrombocytopenia.  Inpatient Medications    Scheduled Meds:  empagliflozin   10 mg Oral Daily   furosemide   60 mg Intravenous Q12H   losartan   25 mg Oral Daily   sodium chloride  flush  3 mL Intravenous Q12H   spironolactone   12.5 mg Oral Daily   Continuous Infusions:  PRN Meds: acetaminophen  **OR** acetaminophen , acetaminophen , melatonin, ondansetron  (ZOFRAN ) IV, ondansetron  **OR** ondansetron  (ZOFRAN ) IV, sodium chloride  flush, sorbitol    Vital Signs    Vitals:   08/01/24 1936 08/02/24 0011 08/02/24 0455 08/02/24 0832  BP: 94/61 105/67 (!) 99/59 105/65  Pulse: 75 74 77 77  Resp: 16 12 18 15   Temp: 97.9 F (36.6 C) (!) 97.4 F (36.3 C) 97.6 F (36.4 C) 97.6 F (36.4 C)  TempSrc: Oral Oral Oral Oral  SpO2: 100% 93% 97% 95%  Weight:   61.4 kg   Height:        Intake/Output Summary (Last 24 hours) at 08/02/2024 1004 Last data filed at 08/02/2024 0836 Gross per 24 hour  Intake 490 ml  Output 4400 ml  Net -3910 ml      08/02/2024    4:55 AM 08/01/2024    5:56 AM 07/31/2024    5:00 AM  Last 3 Weights  Weight (lbs) 135 lb 4.8 oz 143 lb 14.4 oz 150 lb 9.2 oz  Weight (kg) 61.372 kg 65.273 kg 68.3 kg      Telemetry    SR - Personally Reviewed  Physical Exam   GEN: No acute distress.   Neck: No JVD Cardiac: RRR, no murmurs, rubs, or gallops.  Respiratory: Clear to auscultation bilaterally. GI: Soft, nontender, non-distended  MS: Bilateral 1+ edema with skin wrinkling starting; No deformity. Neuro:  Nonfocal  Psych: Normal affect   New pertinent results (labs, ECG,  imaging, cardiac studies)     Assessment & Plan    Acute systolic and diastolic heart failure -recent diagnosis -has diuresed well, down ~14 kg by our weights  -LE edema remains, though she reports significant improved, discussed that this can take significant time to resolve. No appreciated JVD -reviewed ischemic vs. Nonischemic cardiomyopathy, management with medications, and evaluation by cath to look for potential treatable stenosis. Planned for [facility] today. I have communicated her history to Dr. Ladona. -Bun/CR starting to rise. Suspect we may be at intravascular euvolemia. Will use RHC numbers to guide this -low blood pressure limits titration of GDMT. Currently tolerating losartan  25 mg daily, spironolactone  12.5 mg daily, Jardiance  10 mg daily. Will discuss low dose metoprolol prior to discharge   Coronary artery calcification Hyperlipidemia -declines statins. Consider PCSK9i based on results of cath  Von Willebrand Factor Chronic thrombocytopenia -we have discussed how this would make PCI complex, see above    Signed, Shelda Bruckner, MD  08/02/2024, 10:04 AM

## 2024-08-02 NOTE — Progress Notes (Signed)
 Pt arrived back from cath lab at approx 1520.  VS obtained, sites assessed with cath lab RN.  Right brachial site had some bright red blood, cath lab RN changed the dressing.  Right radial site had a small hard area above the TR band.  Cath lab held manual pressure for a few minutes.  By 1540, cath lab called due to swollen area above TR band .  This RN marked the area and started to hold manual pressure.  Cath lab staff held manual pressure/was with the pt from 1545 until this time.  Right radial site currently soft, slightly bruised.  TR band still in place with 13cc of air.  Will continue to monitor closely

## 2024-08-02 NOTE — Progress Notes (Signed)
 Dr. Jerrell Orchard was informed about the new onset orthostatic condition of the patient.  2148: BP 87/62 (71) HR 87 RR 17  Temp 97.7 O2 sats 94% on RA  2152: While getting the patient back in bed, her orthostatic vital signs while lying down include: BP 82/65 (72) HR 80 RR 15 Temp 97.7 O2 sats 94% on RA  2217: Dr. Orchard at bedside and ordered new labs, examined the TR band and suggested not to take it off due to existing hematoma.  The patient continues to note pain at hematoma site when palpating.  VS to be taken q 1 hr until further notice.  Dezaree Tracey, RN

## 2024-08-02 NOTE — Plan of Care (Signed)
  Problem: Education: Goal: Knowledge of General Education information will improve Description: Including pain rating scale, medication(s)/side effects and non-pharmacologic comfort measures Outcome: Progressing   Problem: Health Behavior/Discharge Planning: Goal: Ability to manage health-related needs will improve Outcome: Progressing   Problem: Clinical Measurements: Goal: Ability to maintain clinical measurements within normal limits will improve Outcome: Progressing Goal: Will remain free from infection Outcome: Progressing Goal: Diagnostic test results will improve Outcome: Progressing Goal: Respiratory complications will improve Outcome: Progressing Goal: Cardiovascular complication will be avoided Outcome: Progressing   Problem: Activity: Goal: Risk for activity intolerance will decrease Outcome: Progressing   Problem: Nutrition: Goal: Adequate nutrition will be maintained Outcome: Progressing   Problem: Coping: Goal: Level of anxiety will decrease Outcome: Progressing   Problem: Elimination: Goal: Will not experience complications related to bowel motility Outcome: Progressing Goal: Will not experience complications related to urinary retention Outcome: Progressing   Problem: Pain Managment: Goal: General experience of comfort will improve and/or be controlled Outcome: Progressing   Problem: Safety: Goal: Ability to remain free from injury will improve Outcome: Progressing   Problem: Skin Integrity: Goal: Risk for impaired skin integrity will decrease Outcome: Progressing   Problem: Education: Goal: Understanding of CV disease, CV risk reduction, and recovery process will improve Outcome: Progressing Goal: Individualized Educational Video(s) Outcome: Progressing   Problem: Activity: Goal: Ability to return to baseline activity level will improve Outcome: Progressing   Problem: Cardiovascular: Goal: Ability to achieve and maintain adequate  cardiovascular perfusion will improve Outcome: Progressing

## 2024-08-02 NOTE — Progress Notes (Signed)
   Rounding Note    Patient Name: Meredith Dominguez Date of Encounter: 08/02/2024  [facility] Cardiologist: Shelda Bruckner, MD   Subjective   No acute events overnight. Reviewed plans for [facility] today, what these procedures are, what they will show us . Again discussed that ideally we will not have to place a stent, which would be difficult to manage with her vWF and chronic thrombocytopenia.  Inpatient Medications    Scheduled Meds:  empagliflozin   10 mg Oral Daily   furosemide   60 mg Intravenous Q12H   losartan   25 mg Oral Daily   sodium chloride  flush  3 mL Intravenous Q12H   spironolactone   12.5 mg Oral Daily   Continuous Infusions:  PRN Meds: acetaminophen  **OR** acetaminophen , acetaminophen , melatonin, ondansetron  (ZOFRAN ) IV, ondansetron  **OR** ondansetron  (ZOFRAN ) IV, sodium chloride  flush, sorbitol    Vital Signs    Vitals:   08/01/24 1936 08/02/24 0011 08/02/24 0455 08/02/24 0832  BP: 94/61 105/67 (!) 99/59 105/65  Pulse: 75 74 77 77  Resp: 16 12 18 15   Temp: 97.9 F (36.6 C) (!) 97.4 F (36.3 C) 97.6 F (36.4 C) 97.6 F (36.4 C)  TempSrc: Oral Oral Oral Oral  SpO2: 100% 93% 97% 95%  Weight:   61.4 kg   Height:        Intake/Output Summary (Last 24 hours) at 08/02/2024 1004 Last data filed at 08/02/2024 0836 Gross per 24 hour  Intake 490 ml  Output 4400 ml  Net -3910 ml      08/02/2024    4:55 AM 08/01/2024    5:56 AM 07/31/2024    5:00 AM  Last 3 Weights  Weight (lbs) 135 lb 4.8 oz 143 lb 14.4 oz 150 lb 9.2 oz  Weight (kg) 61.372 kg 65.273 kg 68.3 kg      Telemetry    SR - Personally Reviewed  Physical Exam   GEN: No acute distress.   Neck: No JVD Cardiac: RRR, no murmurs, rubs, or gallops.  Respiratory: Clear to auscultation bilaterally. GI: Soft, nontender, non-distended  MS: Bilateral 1+ edema with skin wrinkling starting; No deformity. Neuro:  Nonfocal  Psych: Normal affect   New pertinent results (labs, ECG,  imaging, cardiac studies)     Assessment & Plan    Acute systolic and diastolic heart failure -recent diagnosis -has diuresed well, down ~14 kg by our weights  -LE edema remains, though she reports significant improved, discussed that this can take significant time to resolve. No appreciated JVD -reviewed ischemic vs. Nonischemic cardiomyopathy, management with medications, and evaluation by cath to look for potential treatable stenosis. Planned for [facility] today. I have communicated her history to Dr. Ladona. -Bun/CR starting to rise. Suspect we may be at intravascular euvolemia. Will use RHC numbers to guide this -low blood pressure limits titration of GDMT. Currently tolerating losartan  25 mg daily, spironolactone  12.5 mg daily, Jardiance  10 mg daily. Will discuss low dose metoprolol prior to discharge   Coronary artery calcification Hyperlipidemia -declines statins. Consider PCSK9i based on results of cath  Von Willebrand Factor Chronic thrombocytopenia -we have discussed how this would make PCI complex, see above    Signed, Shelda Bruckner, MD  08/02/2024, 10:04 AM

## 2024-08-02 NOTE — Progress Notes (Signed)
 Mobility Specialist Progress Note:    08/02/24 1059  Mobility  Activity Ambulated with assistance  Level of Assistance Modified independent, requires aide device or extra time  Assistive Device Four wheel walker  Distance Ambulated (ft) 500 ft  Activity Response Tolerated well  Mobility Referral Yes  Mobility visit 1 Mobility  Mobility Specialist Start Time (ACUTE ONLY) 1059  Mobility Specialist Stop Time (ACUTE ONLY) 1117  Mobility Specialist Time Calculation (min) (ACUTE ONLY) 18 min   Pt pleasant and agreeable to session. No c/o any symptoms but feeling nervous about procedure. Pt moving and ambulating well. Returned pt to EOB w/ all needs met.   Venetia Keel Mobility Specialist Please Neurosurgeon or Rehab Office at [phone]

## 2024-08-03 ENCOUNTER — Inpatient Hospital Stay (HOSPITAL_COMMUNITY)

## 2024-08-03 ENCOUNTER — Encounter (HOSPITAL_COMMUNITY): Payer: Self-pay | Admitting: Cardiology

## 2024-08-03 DIAGNOSIS — I5041 Acute combined systolic (congestive) and diastolic (congestive) heart failure: Secondary | ICD-10-CM | POA: Diagnosis not present

## 2024-08-03 DIAGNOSIS — I50814 Right heart failure due to left heart failure: Secondary | ICD-10-CM

## 2024-08-03 DIAGNOSIS — I5023 Acute on chronic systolic (congestive) heart failure: Secondary | ICD-10-CM | POA: Diagnosis not present

## 2024-08-03 DIAGNOSIS — D68020 Von Willebrand disease, type 2a: Secondary | ICD-10-CM | POA: Diagnosis not present

## 2024-08-03 DIAGNOSIS — I2510 Atherosclerotic heart disease of native coronary artery without angina pectoris: Secondary | ICD-10-CM

## 2024-08-03 DIAGNOSIS — I5020 Unspecified systolic (congestive) heart failure: Secondary | ICD-10-CM

## 2024-08-03 DIAGNOSIS — I340 Nonrheumatic mitral (valve) insufficiency: Secondary | ICD-10-CM

## 2024-08-03 DIAGNOSIS — D6800 Von Willebrand disease, unspecified: Secondary | ICD-10-CM

## 2024-08-03 DIAGNOSIS — I255 Ischemic cardiomyopathy: Secondary | ICD-10-CM | POA: Diagnosis not present

## 2024-08-03 DIAGNOSIS — D696 Thrombocytopenia, unspecified: Secondary | ICD-10-CM

## 2024-08-03 LAB — MULTIPLE MYELOMA PANEL, SERUM
Albumin SerPl Elph-Mcnc: 3.1 g/dL (ref 2.9–4.4)
Albumin SerPl Elph-Mcnc: 3.5 g/dL (ref 2.9–4.4)
Albumin/Glob SerPl: 1.3 (ref 0.7–1.7)
Albumin/Glob SerPl: 1.3 (ref 0.7–1.7)
Alpha 1: 0.2 g/dL (ref 0.0–0.4)
Alpha 1: 0.3 g/dL (ref 0.0–0.4)
Alpha2 Glob SerPl Elph-Mcnc: 0.6 g/dL (ref 0.4–1.0)
Alpha2 Glob SerPl Elph-Mcnc: 0.8 g/dL (ref 0.4–1.0)
B-Globulin SerPl Elph-Mcnc: 0.7 g/dL (ref 0.7–1.3)
B-Globulin SerPl Elph-Mcnc: 0.9 g/dL (ref 0.7–1.3)
Gamma Glob SerPl Elph-Mcnc: 0.8 g/dL (ref 0.4–1.8)
Gamma Glob SerPl Elph-Mcnc: 1 g/dL (ref 0.4–1.8)
Globulin, Total: 2.4 g/dL (ref 2.2–3.9)
Globulin, Total: 2.9 g/dL (ref 2.2–3.9)
IgA: 113 mg/dL (ref 64–422)
IgA: 133 mg/dL (ref 64–422)
IgG (Immunoglobin G), Serum: 797 mg/dL (ref 586–1602)
IgG (Immunoglobin G), Serum: 929 mg/dL (ref 586–1602)
IgM (Immunoglobulin M), Srm: 90 mg/dL (ref 26–217)
IgM (Immunoglobulin M), Srm: 107 mg/dL (ref 26–217)
Total Protein ELP: 5.5 g/dL — ABNORMAL LOW (ref 6.0–8.5)
Total Protein ELP: 6.4 g/dL (ref 6.0–8.5)

## 2024-08-03 LAB — COMPREHENSIVE METABOLIC PANEL WITH GFR
ALT: 31 U/L (ref 0–44)
AST: 17 U/L (ref 15–41)
Albumin: 3.4 g/dL — ABNORMAL LOW (ref 3.5–5.0)
Alkaline Phosphatase: 78 U/L (ref 38–126)
Anion gap: 15 (ref 5–15)
BUN: 34 mg/dL — ABNORMAL HIGH (ref 8–23)
CO2: 27 mmol/L (ref 22–32)
Calcium: 9 mg/dL (ref 8.9–10.3)
Chloride: 95 mmol/L — ABNORMAL LOW (ref 98–111)
Creatinine, Ser: 1.31 mg/dL — ABNORMAL HIGH (ref 0.44–1.00)
GFR, Estimated: 42 mL/min — ABNORMAL LOW (ref >60)
Glucose, Bld: 95 mg/dL (ref 70–99)
Potassium: 3.7 mmol/L (ref 3.5–5.1)
Sodium: 137 mmol/L (ref 135–145)
Total Bilirubin: 1.1 mg/dL (ref 0.0–1.2)
Total Protein: 6 g/dL — ABNORMAL LOW (ref 6.5–8.1)

## 2024-08-03 LAB — ECHOCARDIOGRAM COMPLETE
Area-P 1/2: 4.86 cm2
Calc EF: 41.1 %
Height: 65 in
S' Lateral: 5.2 cm
Single Plane A2C EF: 32.5 %
Single Plane A4C EF: 45 %
Weight: 2148.16 [oz_av]

## 2024-08-03 LAB — CBC WITH DIFFERENTIAL/PLATELET
Abs Immature Granulocytes: 0.02 K/uL (ref 0.00–0.07)
Basophils Absolute: 0.1 K/uL (ref 0.0–0.1)
Basophils Relative: 1 %
Eosinophils Absolute: 0.1 K/uL (ref 0.0–0.5)
Eosinophils Relative: 2 %
HCT: 43.3 % (ref 36.0–46.0)
Hemoglobin: 14.7 g/dL (ref 12.0–15.0)
Immature Granulocytes: 0 %
Lymphocytes Relative: 12 %
Lymphs Abs: 1 K/uL (ref 0.7–4.0)
MCH: 30.9 pg (ref 26.0–34.0)
MCHC: 33.9 g/dL (ref 30.0–36.0)
MCV: 91.2 fL (ref 80.0–100.0)
Monocytes Absolute: 0.7 K/uL (ref 0.1–1.0)
Monocytes Relative: 9 %
Neutro Abs: 6.2 K/uL (ref 1.7–7.7)
Neutrophils Relative %: 76 %
Platelets: 66 K/uL — ABNORMAL LOW (ref 150–400)
RBC: 4.75 MIL/uL (ref 3.87–5.11)
RDW: 14.6 % (ref 11.5–15.5)
WBC: 8.1 K/uL (ref 4.0–10.5)
nRBC: 0 % (ref 0.0–0.2)

## 2024-08-03 LAB — LACTIC ACID, PLASMA: Lactic Acid, Venous: 1 mmol/L (ref 0.5–1.9)

## 2024-08-03 MED ORDER — POLYETHYLENE GLYCOL 3350 17 G PO PACK
17.0000 g | PACK | Freq: Every day | ORAL | Status: DC
  Administered 2024-08-03: 17 g via ORAL
  Filled 2024-08-03 (×3): qty 1

## 2024-08-03 MED FILL — Lidocaine HCl Local Preservative Free (PF) Inj 1%: INTRAMUSCULAR | Qty: 30 | Status: AC

## 2024-08-03 NOTE — Progress Notes (Signed)
 The results of the cardiac cath have been ordered.  It looks like she may need to have CABG.  I told her that if she needed a CABG, we can get her through this despite the thrombocytopenia and the von Willebrand's.  We do need to get a bone marrow biopsy on her.  I need to make sure that there is no obvious plasma cell disorder.  Hopefully, we can get this done tomorrow.  Her blood pressure was a bit lower today.  She has been diuresed aggressively.  She lost quite a few pounds.  There is no bleeding from the site on the right wrist where she had the cardiac cath done.  Her BUN and creatinine are 34 and 1.31.  Her white cell count is 8.1.  Hemoglobin 14.7.  Platelet count 66,000.  Again, her cardiac status is way more important than her von Willebrand's.  Again we can certainly give her Humate-P  prior to any cardiac surgery.  We could control the bleeding.  I know she is very worried about all of this.  I tried to reassure her that again, the thoracic surgeons are very good.  We will work together to make sure that if she needs surgery that it is as safe as possible for her.   Jeralyn Crease, MD  Psalm 27:14
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
## 2024-08-03 NOTE — Consult Note (Signed)
 Chief Complaint:  maple Budd w/ thrombocytopenia  Procedure: Bone Marrow Biopsy with Aspiration  Referring Provider(s): Dr. Maude Crease  Supervising Physician: Jennefer Rover  Patient Status: [facility] - [facility]  History of Present Illness: Meredith Dominguez is a 73 y.o. female with a history of osteoporosis, von Willebrand's disease, thrombocytopenia, and chronically elevated lambda light chains who recently presented to the ED with shortness of breath and chest tightness. Found to have CHF with acute exacerbation, admitted, and started on Lasix . Cardiac catheterization on 9/10 also significant for LV systolic dysfunction with three-vessel CAD in need of heart failure optimization and consideration of CABG. Oncology consulted for management of von Willebrand's and worsening thrombocytopenia over the past few months. Myeloma studies ordered, but not yet resulted. Oncology requesting bone marrow biopsy with aspiration to rule out plasma cell disorder.   Patient is currently sitting by the bedside in no acute distress. Patient is clearly overwhelmed by all the new diagnoses and information that she has received. Admits to mild shortness of breath still that has improved from before. Otherwise without complaints. All questions and concerns answered at the bedside.   Patient is Full Code  Past Medical History:  Diagnosis Date   Acute exacerbation of CHF (congestive heart failure) (HCC) 07/30/2024   Anemia    Encounter for education about heart failure 08/01/2024   Kidney stones    Osteoporosis 05/15/2019   Pharyngitis 05/25/2017   Von Willebrand disease (HCC)     Past Surgical History:  Procedure Laterality Date   BREAST SURGERY     Breast Biopsy Left x2   RIGHT/LEFT HEART CATH AND CORONARY ANGIOGRAPHY N/A 08/02/2024   Procedure: RIGHT/LEFT HEART CATH AND CORONARY ANGIOGRAPHY;  Surgeon: Ladona Heinz, MD;  Location: [facility];  Service: Cardiovascular;  Laterality: N/A;     Allergies: Monosodium glutamate, Red dye #40 (allura red), Gluten meal, Other, Sesame oil, Tomato, Wheat, Sulfa antibiotics, and Sulfa antibiotics  Medications: Prior to Admission medications   Medication Sig Start Date End Date Taking? Authorizing Provider  CALCIUM  LACTATE PO Take 3 tablets by mouth daily as needed (for bleeds).   Yes [provider]  MAGNESIUM  CITRATE PO Take 150 mg by mouth daily.   Yes [provider]  NON FORMULARY Take 9 drops by mouth See admin instructions. [place] 200C Bioactive Nutritional- Place 9 drops in the mouth a day as needed for heart health   Yes [provider]  OVER THE COUNTER MEDICATION Take 1 tablet by mouth. HYDROXO B12 WITH FOLINIC ACID - Chew 1 tablet by mouth once a day as needed to combat Von Willebrand disease   Yes [provider]  OVER THE COUNTER MEDICATION Take 1 tablet by mouth daily. PurDentix Oral Health   Yes [provider]  UNABLE TO FIND Take 1 tablet by mouth daily. Med Name: Cyruta Plus   Yes [provider]  calcium  carbonate (TUMS - DOSED IN MG ELEMENTAL CALCIUM ) 500 MG chewable tablet Chew 1 tablet (200 mg of elemental calcium  total) by mouth 3 (three) times daily. Patient not taking: Reported on 07/31/2024 05/30/24   Augustus Almarie RAMAN, PA-C  feeding supplement (ENSURE PLUS HIGH PROTEIN) LIQD Take 237 mLs by mouth 2 (two) times daily between meals. Patient not taking: Reported on 07/31/2024 05/30/24   Augustus Almarie RAMAN, PA-C  folic acid  (FOLVITE ) 1 MG tablet Take 2 tablets (2 mg total) by mouth daily. Patient not taking: Reported on 07/31/2024 05/31/24   Augustus Almarie RAMAN,  PA-C  Misc Natural Products (ADRENAL PO) Take 1 tablet by mouth daily. Patient not taking: Reported on 07/31/2024    [provider]  Multiple Minerals-Vitamins (CALCIUM  & VIT D3 BONE HEALTH PO) Take 1 tablet by mouth in the morning, at noon, and at bedtime. Patient not taking: Reported on 07/31/2024     [provider]  NON FORMULARY Take 9 drops by mouth See admin instructions. Stomachforce A  Bioactive Nutritional - Place 9 drops in the mouth once a day as needed for amoebas or parasites Patient not taking: Reported on 07/31/2024    [provider]  NON FORMULARY Take 2,775 mg by mouth See admin instructions. Calcium  bone support 925 mg - Take 2,775 mg by mouth once a day Patient not taking: Reported on 07/31/2024    [provider]  NON FORMULARY Take 1 capsule by mouth See admin instructions. For-Til B12 - Take 1 capsule by mouth once a day Patient not taking: Reported on 07/31/2024    [provider]  NONFORMULARY OR COMPOUNDED ITEM Take 1 capsule by mouth See admin instructions. Peak Thyroid  support - Take 1 capsule by mouth once a day Patient not taking: Reported on 07/31/2024    [provider]  NONFORMULARY OR COMPOUNDED ITEM See admin instructions. Digestin capsule - Take 1 capsule by mouth 2 times a day after a heavy meal Patient not taking: Reported on 07/31/2024    [provider]  NONFORMULARY OR COMPOUNDED ITEM Take 1-2 capsules by mouth See admin instructions. Gluten dairy digest capsules - Take 1-2 capsules by mouth once a day as needed to tolerate dairy Patient not taking: Reported on 07/31/2024    [provider]  NONFORMULARY OR COMPOUNDED ITEM Take 3 tablets by mouth See admin instructions. Cyruta Plus tablets - Take 3 tablets by mouth three times a day Patient not taking: Reported on 07/31/2024    [provider]  OVER THE COUNTER MEDICATION Take by mouth See admin instructions. Spleen thymus liquescence-  Take 0.5 dropperful by mouthl twice a day Patient not taking: Reported on 07/31/2024    [provider]  OVER THE COUNTER MEDICATION Take 5-10 drops by mouth See admin instructions. Infection EGA II -  Place 5-10 drops in the mouth three times a day as needed to improve the immune system and combat  giardia Patient not taking: Reported on 07/31/2024    [provider]  OVER THE COUNTER MEDICATION daily. Ferro food. Patient not taking: Reported on 07/31/2024    [provider]  OVER THE COUNTER MEDICATION Take 1 tablet by mouth in the morning and at bedtime. For-Til B12 Supplement Patient not taking: Reported on 07/31/2024    [provider]     Family History  Problem Relation Age of Onset   Leukemia Cousin    Cancer Mother    Diabetes Father    Heart disease Father    Bleeding Disorder Neg Hx     Social History   Socioeconomic History   Marital status: Divorced    Spouse name: Not on file   Number of children: Not on file   Years of education: Not on file   Highest education level: Not on file  Occupational History   Not on file  Tobacco Use   Smoking status: Never   Smokeless tobacco: Never  Vaping Use   Vaping status: Never Used  Substance and Sexual Activity   Alcohol use: Yes    Comment: wine once per  week   Drug use: No   Sexual activity: Not Currently  Other Topics Concern   Not on file  Social History Narrative   ** Merged History Encounter **       Social Drivers of Health   Financial Resource Strain: Low Risk  (10/19/2022)   Overall Financial Resource Strain (CARDIA)    Difficulty of Paying Living Expenses: Not hard at all  Food Insecurity: No Food Insecurity (07/30/2024)   Hunger Vital Sign    Worried About Running Out of Food in the Last Year: Never true    Ran Out of Food in the Last Year: Never true  Transportation Needs: No Transportation Needs (07/30/2024)   PRAPARE - Administrator, Civil Service (Medical): No    Lack of Transportation (Non-Medical): No  Physical Activity: Insufficiently Active (10/13/2021)   Exercise Vital Sign    Days of Exercise per Week: 4 days    Minutes of Exercise per Session: 30 min  Stress: No Stress Concern Present (10/19/2022)   [employer] of Occupational Health -  Occupational Stress Questionnaire    Feeling of Stress : Only a little  Social Connections: Moderately Integrated (07/30/2024)   Social Connection and Isolation Panel    Frequency of Communication with Friends and Family: More than three times a week    Frequency of Social Gatherings with Friends and Family: More than three times a week    Attends Religious Services: More than 4 times per year    Active Member of [employer] or Organizations: Yes    Attends Engineer, structural: More than 4 times per year    Marital Status: Divorced     Review of Systems  Respiratory:  Positive for shortness of breath.   Patient denies any headache, chest pain, shortness of breath, abdominal pain, N/V, or fever/chills. All other systems are negative.   Vital Signs: BP (!) 91/53   Pulse 71   Temp 98.1 F (36.7 C) (Oral)   Resp (!) 23   Ht 5' 5 (1.651 m)   Wt 134 lb 4.2 oz (60.9 kg)   SpO2 93%   BMI 22.34 kg/m    Physical Exam HENT:     Mouth/Throat:     Mouth: Mucous membranes are moist.     Pharynx: Oropharynx is clear.  Cardiovascular:     Rate and Rhythm: Normal rate.  Pulmonary:     Effort: Pulmonary effort is normal.  Abdominal:     General: Abdomen is flat.     Palpations: Abdomen is soft.     Tenderness: There is no abdominal tenderness.  Skin:    General: Skin is warm and dry.  Neurological:     Mental Status: She is alert and oriented to person, place, and time.  Psychiatric:        Behavior: Behavior normal.     Imaging: CARDIAC CATHETERIZATION Result Date: 08/02/2024 Images from the original result were not included. Cardiac Catheterization 08/02/24: Hemodynamic data: RA 11/9, mean 9 mmHg. RV 51/6, EDP 14 mmHg. PA 36/9, mean 22 mmHg. PW 34/33, mean 25 mmHg. CO 6.01, CI 3.5 by Fick.  QP/QS 1.00.  PAPi 3.0. Angiographic data: LM: Large-caliber vessel, mildly calcified. LAD: Gives origin to a very large septal perforator 1, it is at least a 3 mm vessel, has a ostial 20  to 30% stenosis.  SP1 gives collaterals to distal RCA and also to occluded mid CX.  LAD is 80% stenosed at the origin of  a moderate size D1.  There is a moderate to large D2 followed by LAD stenosis of 50%.  Mild disease noted in the apical LAD. LCx: It is a moderate to large caliber vessel however occluded in the proximal segment of closure of a very small OM1.  There is a very large OM 2 and 3R followed by collaterals from septal perforator. RI: Small to moderate caliber vessel, mild disease. RCA: Proximal 90% calcific focal stenosis.  Gives origin to large PDA and moderate-sized PL branch. Impression and recommendations: Patient has severe LV systolic dysfunction with three-vessel coronary artery disease.  All the coronaries have very focal 8 to 12 mm occlusion or stenosis.  Patient has volume excess by right heart catheterization, PCW still elevated at 25 mmHg.  She will need optimization of heart failure and consideration for CABG as PCI is reasonable however need for long-term DAPT may complicate issues in view of von Willebrand's disease.   DG Chest 2 View Result Date: 07/30/2024 CLINICAL DATA:  Shortness of breath. EXAM: CHEST - 2 VIEW COMPARISON:  May 22, 2024. FINDINGS: Mild cardiomegaly is noted. Diffuse bilateral interstitial densities are noted consistent with pulmonary edema and bilateral pleural effusions. Bony thorax is unremarkable. IMPRESSION: Diffuse bilateral pulmonary edema and bilateral pleural effusions. Electronically Signed   By: Lynwood Landy Raddle M.D.   On: 07/30/2024 16:18    Labs:  CBC: Recent Labs    07/31/24 0404 08/02/24 0632 08/02/24 1454 08/02/24 1457 08/02/24 2241 08/03/24 0123  WBC 5.5 6.3  --   --  8.1 8.1  HGB 12.8 14.7 15.0  15.0 14.6 14.9 14.7  HCT 39.2 43.6 44.0  44.0 43.0 44.4 43.3  PLT 56* 60*  --   --  65* 66*    COAGS: Recent Labs    02/01/24 0639 02/01/24 1859 05/22/24 1154 05/22/24 1745 05/29/24 0238 05/30/24 0647 06/20/24 0950  07/19/24 1101  INR 1.2  --  1.2  --   --   --   --   --   APTT  --    < >  --    < > 43* 36 31 31   < > = values in this interval not displayed.    BMP: Recent Labs    08/01/24 0306 08/02/24 0238 08/02/24 1454 08/02/24 1457 08/02/24 2241 08/03/24 0123  NA 138 137 138  137 137 139 137  K 3.5 3.8 3.8  3.8 3.8 3.6 3.7  CL 100 98  --   --  96* 95*  CO2 25 24  --   --  28 27  GLUCOSE 85 77  --   --  120* 95  BUN 27* 30*  --   --  34* 34*  CALCIUM  8.8* 8.5*  --   --  8.9 9.0  CREATININE 1.10* 1.14*  --   --  1.60* 1.31*  GFRNONAA 52* 50*  --   --  33* 42*    LIVER FUNCTION TESTS: Recent Labs    07/30/24 1649 08/01/24 0306 08/02/24 2241 08/03/24 0123  BILITOT 0.8 1.2 1.1 1.1  AST 65* 21 21 17   ALT 86* 45* 32 31  ALKPHOS 121 77 80 78  PROT 6.4* 5.6* 6.1* 6.0*  ALBUMIN 4.1 3.2* 3.6 3.4*    TUMOR MARKERS: No results for input(s): AFPTM, CEA, CA199, CHROMGRNA in the last 8760 hours.  Assessment and Plan:  Von Willebrand's with worsening thrombocytopenia: Meredith Dominguez is a 73 y.o. female with a history of von  Willebrand's and chronic thrombocytopenia with recently worsening levels. Currently admitted for acute on chronic CHF. IR with request for bone marrow biopsy with aspiration to rule out plasma cell disorder given her worsening thrombocytopenia. Procedure to be performed under moderate sedation.   -Platelets 66k today  - Kappa and lambda free light chains elevated to 25.7 and 408.6 respectively -Myeloma panel and UPEP pending -NPO at midnight -Tentative plan for bone marrow biopsy on 9/12  Risks and benefits of bone marrow biopsy was discussed with the patient and/or patient's family including, but not limited to bleeding, infection, damage to adjacent structures or low yield requiring additional tests.  All of the questions were answered and there is agreement to proceed.  Consent signed and in chart.   Thank you for allowing our service to  participate in Meredith Dominguez 's care.    Electronically Signed: Glennon CHRISTELLA Bal, PA-C   08/03/2024, 8:32 AM     I spent a total of 40 Minutes in face to face in clinical consultation, greater than 50% of which was counseling/coordinating care for bone marrow biopsy with aspiration.

## 2024-08-03 NOTE — Progress Notes (Addendum)
 Brief Update Note  I was notified of orthostasis while the patient was using the commode earlier this evening. She states he had some vision changes while using the restroom. BP 80s/60s. Upon my evaluation, she felt better while lying in her bed, and her vision changes resolved. She does note feeling tired.  Labs obtained due to lower BP compared to normal.   CBC stable  Lactic acid normal at 1.7 CMP notable for AKI (Cr now 1.60 from 1.14) and unremarkable liver panel.  RHC from 9/10 shows volume overload with elevated right and left sided filling pressures (RA 9 and PCWP 25). However, given this new AKI and lower BP, I will hold diuresis for now as she is breathing room air comfortably. She has had intolerance to GDMT in the past, and I suspect her relative hypotension is due to a combination of intolarence to GDMT with possible component of delayed clearance of sedation (although only 1mg  Versed  and 25 mcg fentanyl  were used). I have another lactic acid pending 3hrs after the initial one. If this remains negative, MAPs >60-65, and symptoms improving, then we will continue to monitor her closely and refrain from further intervention such as vasopressors. CI is 3.5 so it is unlikely that she needs an inotrope.  Update- Lactic acid normal and Cr decreased to 1.3. BP improved overnight with time, and Lasix  can be resumed per the primary team.

## 2024-08-03 NOTE — Progress Notes (Signed)
  Echocardiogram 2D Echocardiogram has been performed.  Norleen ORN [facility] 08/03/2024, 3:59 PM
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
## 2024-08-03 NOTE — Progress Notes (Signed)
 Rounding Note    Patient Name: Meredith Dominguez Date of Encounter: 08/03/2024  [facility] Cardiologist: Shelda Bruckner, MD   Subjective   We had extensive discussion about her cath results, that this is consistent with ischemic cardiomyopathy, options for revascularization including pros/cons. See summary below. She did have episode of orthostatic lightheadedness yesterday and blood pressure has been running low.  Inpatient Medications    Scheduled Meds:  empagliflozin   10 mg Oral Daily   polyethylene glycol  17 g Oral Daily   sodium chloride  flush  3 mL Intravenous Q12H   Continuous Infusions:   PRN Meds: acetaminophen  **OR** acetaminophen , acetaminophen , iohexol , melatonin, ondansetron  (ZOFRAN ) IV, ondansetron  **OR** ondansetron  (ZOFRAN ) IV, sodium chloride  flush, sodium chloride  flush, sorbitol    Vital Signs    Vitals:   08/03/24 0600 08/03/24 0800 08/03/24 1108 08/03/24 1601  BP: (!) 91/53 (!) 90/57 90/63   Pulse: 71 77 86   Resp: (!) 23 (!) 22 16 20   Temp:  98.5 F (36.9 C) 97.7 F (36.5 C) 97.7 F (36.5 C)  TempSrc:  Oral Oral Oral  SpO2: 93% 95% 100%   Weight:      Height:        Intake/Output Summary (Last 24 hours) at 08/03/2024 1813 Last data filed at 08/03/2024 1017 Gross per 24 hour  Intake 243 ml  Output 125 ml  Net 118 ml      08/03/2024    4:50 AM 08/02/2024    4:55 AM 08/01/2024    5:56 AM  Last 3 Weights  Weight (lbs) 134 lb 4.2 oz 135 lb 4.8 oz 143 lb 14.4 oz  Weight (kg) 60.9 kg 61.372 kg 65.273 kg      Telemetry    SR - Personally Reviewed  Physical Exam   GEN: No acute distress.   Neck: No JVD visualized sitting upright Cardiac: RRR, no murmurs, rubs, or gallops.  Respiratory: Clear to auscultation bilaterally in upper fields, slightly diminished at bilateral bases GI: Soft, nontender, non-distended  MS: Bilateral 1+ edema with skin wrinkling starting; No deformity. Neuro:  Nonfocal  Psych: Normal affect    New pertinent results (labs, ECG, imaging, cardiac studies)    Cath from 9/10 personally reviewed, see below.  Assessment & Plan    Acute systolic and diastolic heart failure Cardiomyopathy, likely ischemic based on cath -recent diagnosis -has diuresed well, down ~14 kg by our weights  -LE edema remains, though she reports significant improved, discussed that this can take significant time to resolve. No appreciated JVD -reviewed her cath results at length today, which showed three vessel disease, including 80% proximal LAD, CTO of Lcx at proximal segment with left-left collaterals from septal perforator, 90% proximal RCA -RHC with RA mean 9, RV 51/6 (14), PA 36/9 (22), wedge 25. CO 6, CI 3.5, PAPi 3. -plan had been to afterload reduce and continue diuresis. However, low blood pressure/orthostasis limits this today -low blood pressure limits titration of GDMT. Had to stop losartan , spironolactone , lasix  for now given orthostasis and low blood pressure. Will try to add back as able. Tolerating Jardiance  -CT surgery consulted. Advanced HF to see patient tomorrow to optimize prior to consideration of surgery   Coronary artery disease Hyperlipidemia -as above, reviewed her cath extensively. We specifically discussed that PCI carries need for DAPT, which has elevated risk, and long term antiplatelet for stents. We also discussed CABG, which would require heme support/transfusion perioperatively but less antiplatelet therapy. She spoke to Dr. Timmy and  is comfortable that she could get through surgery with transfusion. We discussed what surgery entails, recovery process, etc. -declines statins. Would use PCKS9i -lipids from 04/25/24 showed Tchol 229, TG 96, HDL 65, LDL 144 -updating fasting lipids and lp(a) tomorrow AM  Von Willebrand Factor Chronic thrombocytopenia Discussed at length in setting of both PCI and need for DAPT and then long term antiplatelet vs perioperative management with  CABG  60 minutes spent at bedside with patient today discussing cath results, risk/benefit of PCI vs CABG, review of cardiomyopathy and management as noted.    Signed, Shelda Bruckner, MD  08/03/2024, 6:13 PM

## 2024-08-04 ENCOUNTER — Inpatient Hospital Stay (HOSPITAL_COMMUNITY)

## 2024-08-04 DIAGNOSIS — I5082 Biventricular heart failure: Secondary | ICD-10-CM | POA: Diagnosis not present

## 2024-08-04 DIAGNOSIS — I5023 Acute on chronic systolic (congestive) heart failure: Secondary | ICD-10-CM | POA: Diagnosis not present

## 2024-08-04 LAB — CBC WITH DIFFERENTIAL/PLATELET
Abs Immature Granulocytes: 0.03 K/uL (ref 0.00–0.07)
Basophils Absolute: 0.1 K/uL (ref 0.0–0.1)
Basophils Relative: 1 %
Eosinophils Absolute: 0.3 K/uL (ref 0.0–0.5)
Eosinophils Relative: 4 %
HCT: 41.1 % (ref 36.0–46.0)
Hemoglobin: 13.9 g/dL (ref 12.0–15.0)
Immature Granulocytes: 1 %
Lymphocytes Relative: 22 %
Lymphs Abs: 1.5 K/uL (ref 0.7–4.0)
MCH: 30.7 pg (ref 26.0–34.0)
MCHC: 33.8 g/dL (ref 30.0–36.0)
MCV: 90.7 fL (ref 80.0–100.0)
Monocytes Absolute: 0.7 K/uL (ref 0.1–1.0)
Monocytes Relative: 11 %
Neutro Abs: 4.1 K/uL (ref 1.7–7.7)
Neutrophils Relative %: 61 %
Platelets: 55 K/uL — ABNORMAL LOW (ref 150–400)
RBC: 4.53 MIL/uL (ref 3.87–5.11)
RDW: 14.7 % (ref 11.5–15.5)
WBC: 6.6 K/uL (ref 4.0–10.5)
nRBC: 0 % (ref 0.0–0.2)

## 2024-08-04 LAB — LIPID PANEL
Cholesterol: 167 mg/dL (ref 0–200)
HDL: 46 mg/dL (ref >40)
LDL Cholesterol: 111 mg/dL — ABNORMAL HIGH (ref 0–99)
Total CHOL/HDL Ratio: 3.6 ratio
Triglycerides: 52 mg/dL (ref <150)
VLDL: 10 mg/dL (ref 0–40)

## 2024-08-04 LAB — UPEP/UIFE/LIGHT CHAINS/TP, 24-HR UR
% BETA, Urine: 0 %
ALPHA 1 URINE: 0 %
Albumin, U: 100 %
Alpha 2, Urine: 0 %
Free Kappa Lt Chains,Ur: 4.79 mg/L (ref 1.17–86.46)
Free Kappa/Lambda Ratio: 3.05 (ref 1.83–14.26)
Free Lambda Lt Chains,Ur: 1.57 mg/L (ref 0.27–15.21)
GAMMA GLOBULIN URINE: 0 %
Total Protein, Urine-Ur/day: 1023 mg/(24.h) — ABNORMAL HIGH (ref 30–150)
Total Protein, Urine: 18.6 mg/dL

## 2024-08-04 LAB — BASIC METABOLIC PANEL WITH GFR
Anion gap: 11 (ref 5–15)
BUN: 37 mg/dL — ABNORMAL HIGH (ref 8–23)
CO2: 28 mmol/L (ref 22–32)
Calcium: 8.6 mg/dL — ABNORMAL LOW (ref 8.9–10.3)
Chloride: 97 mmol/L — ABNORMAL LOW (ref 98–111)
Creatinine, Ser: 1.02 mg/dL — ABNORMAL HIGH (ref 0.44–1.00)
GFR, Estimated: 57 mL/min — ABNORMAL LOW (ref >60)
Glucose, Bld: 81 mg/dL (ref 70–99)
Potassium: 4 mmol/L (ref 3.5–5.1)
Sodium: 136 mmol/L (ref 135–145)

## 2024-08-04 MED ORDER — SPIRONOLACTONE 12.5 MG HALF TABLET
12.5000 mg | ORAL_TABLET | Freq: Every day | ORAL | Status: DC
  Administered 2024-08-04 – 2024-08-06 (×3): 12.5 mg via ORAL
  Filled 2024-08-04 (×3): qty 1

## 2024-08-04 MED ORDER — FUROSEMIDE 10 MG/ML IJ SOLN
40.0000 mg | Freq: Once | INTRAMUSCULAR | Status: AC
  Administered 2024-08-04: 40 mg via INTRAVENOUS
  Filled 2024-08-04: qty 4

## 2024-08-04 MED ORDER — FENTANYL CITRATE (PF) 100 MCG/2ML IJ SOLN
INTRAMUSCULAR | Status: AC
  Filled 2024-08-04: qty 2

## 2024-08-04 MED ORDER — FENTANYL CITRATE (PF) 100 MCG/2ML IJ SOLN
INTRAMUSCULAR | Status: AC | PRN
  Administered 2024-08-04: 25 ug via INTRAVENOUS

## 2024-08-04 MED ORDER — MIDAZOLAM HCL 2 MG/2ML IJ SOLN
INTRAMUSCULAR | Status: AC
  Filled 2024-08-04: qty 2

## 2024-08-04 MED ORDER — LIDOCAINE HCL 1 % IJ SOLN
INTRAMUSCULAR | Status: AC
  Filled 2024-08-04: qty 20

## 2024-08-04 MED ORDER — MIDAZOLAM HCL 2 MG/2ML IJ SOLN
INTRAMUSCULAR | Status: AC | PRN
  Administered 2024-08-04: .5 mg via INTRAVENOUS
  Administered 2024-08-04: 1 mg via INTRAVENOUS

## 2024-08-04 MED ORDER — LIDOCAINE HCL 1 % IJ SOLN
20.0000 mL | Freq: Once | INTRAMUSCULAR | Status: DC
  Filled 2024-08-04: qty 20

## 2024-08-04 MED ORDER — ATORVASTATIN CALCIUM 40 MG PO TABS
40.0000 mg | ORAL_TABLET | Freq: Every day | ORAL | Status: DC
  Administered 2024-08-05 – 2024-08-06 (×2): 40 mg via ORAL
  Filled 2024-08-04 (×3): qty 1

## 2024-08-04 NOTE — Progress Notes (Signed)
 Mobility Specialist Progress Note:    08/04/24 1659  Mobility  Activity Ambulated with assistance  Level of Assistance Standby assist, set-up cues, supervision of patient - no hands on  Assistive Device Four wheel walker  Distance Ambulated (ft) 200 ft  Activity Response Tolerated well  Mobility Referral Yes  Mobility visit 1 Mobility  Mobility Specialist Start Time (ACUTE ONLY) 1659  Mobility Specialist Stop Time (ACUTE ONLY) 1708  Mobility Specialist Time Calculation (min) (ACUTE ONLY) 9 min   Received pt sitting w/ recliner eager for another session. No c/o any symptoms. Pt moving and ambulating well w/ no assist. Returned pt to recliner w/ all needs met.   Venetia Keel Mobility Specialist Please Neurosurgeon or Rehab Office at [phone]
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
## 2024-08-04 NOTE — TOC Initial Note (Signed)
 Transition of Care [facility]) - Initial/Assessment Note    Patient Details  Name: Meredith Dominguez MRN: [id number] Date of Birth: [birth year]  Transition of Care [facility] CM/SW Contact:    Justina Delcia Czar, RN Phone Number: [phone] 08/04/2024, 12:53 PM  Clinical Narrative:                 Spoke to pt at beside and states she lives independently at [place]. She is active with [facility], contacted rep and pt has HHRN, PT and OT. Will need orders for [facility], PT, and OT with F2F. States she has private aide that comes 2-3x per week. She will have more days once she completes elimination days for long term care policy. States she uses Lyft for transportation. She will have her friend provide transportation at [employer]. Pt has her Rollator in the room. Has RW, bedside commode and scale at home. Will continue to follow for dc needs.   Expected Discharge Plan: Home w Home Health Services Barriers to Discharge: Continued Medical Work up   Patient Goals and CMS Choice Patient states their goals for this hospitalization and ongoing recovery are:: wants to remain independent CMS Medicare.gov Compare Post Acute Care list provided to:: Patient Choice offered to / list presented to : Patient      Expected Discharge Plan and Services   Discharge Planning Services: CM Consult Post Acute Care Choice: Home Health Living arrangements for the past 2 months: Independent Living Facility                           HH Arranged: RN, PT, OT [facility] Agency: [facility] Date [facility] Agency Contacted: 08/04/24 Time HH Agency Contacted: 1250 Representative spoke with at [facility] Agency: Burnard Bucks  Prior Living Arrangements/Services Living arrangements for the past 2 months: Independent Living Facility Lives with:: Self Patient language and need for interpreter reviewed:: Yes Do you feel safe going back to the place where you live?: Yes      Need for Family Participation in Patient Care: No  (Comment) Care giver support system in place?: Yes (comment) Current home services: DME (rollator, rolling walker, bedside commode, scale) Criminal Activity/Legal Involvement Pertinent to Current Situation/Hospitalization: No - Comment as needed  Activities of Daily Living   ADL Screening (condition at time of admission) Independently performs ADLs?: Yes (appropriate for developmental age) Is the patient deaf or have difficulty hearing?: No Does the patient have difficulty seeing, even when wearing glasses/contacts?: No Does the patient have difficulty concentrating, remembering, or making decisions?: No  Permission Sought/Granted Permission sought to share information with : Case Manager, Family Supports, PCP Permission granted to share information with : Yes, Verbal Permission Granted  Share Information with NAME: Bruna Dart  Permission granted to share info w AGENCY: Home Health, PCP, DME  Permission granted to share info w Relationship: friend  Permission granted to share info w Contact Information: [phone]  Emotional Assessment Appearance:: Appears stated age Attitude/Demeanor/Rapport: Engaged Affect (typically observed): Accepting Orientation: : Oriented to Self, Oriented to Place, Oriented to  Time, Oriented to Situation   Psych Involvement: No (comment)  Admission diagnosis:  Acute exacerbation of CHF (congestive heart failure) (HCC) [I50.9] Acute congestive heart failure, unspecified heart failure type [name]) [I50.9] Patient Active Problem List   Diagnosis Date Noted   Ischemic cardiomyopathy 08/03/2024   Coronary artery disease involving native coronary artery of native heart without angina pectoris 08/03/2024   Acute combined  systolic and diastolic heart failure (HCC) 08/01/2024   Encounter for education about heart failure 08/01/2024   Acute on chronic systolic CHF (congestive heart failure) (HCC) 07/30/2024   Thrombocytopenia (HCC) 05/30/2024   Anemia 05/30/2024    Abdominal wall hematoma 05/22/2024   VWD (von Willebrand's disease) (HCC) 05/22/2024   Symptomatic anemia 02/03/2024   Hematoma 02/01/2024   Osteoporosis 05/15/2019   Pharyngitis 05/25/2017   Von Willebrand disease, type IIa (HCC) 07/29/2016   Acute blood loss anemia 07/29/2016   Thrombocytopenia (HCC) 07/29/2016   Bleeding gums    PCP:  [name], MD Pharmacy:   [id number] - [place], [place] - [address] [place] [address] Phone: [phone] Fax: [phone]  [facility] [address] [place] [address] Phone: [phone] Fax: [phone]     Social Drivers of Health (SDOH) Social History: SDOH Screenings   Food Insecurity: No Food Insecurity (07/30/2024)  Housing: Low Risk  (07/30/2024)  Transportation Needs: No Transportation Needs (07/30/2024)  Utilities: Not At Risk (07/30/2024)  Alcohol Screen: Low Risk  (10/19/2022)  Depression (PHQ2-9): Low Risk  (07/19/2024)  Financial Resource Strain: Low Risk  (10/19/2022)  Physical Activity: Insufficiently Active (10/13/2021)  Social Connections: Moderately Integrated (07/30/2024)  Stress: No Stress Concern Present (10/19/2022)  Tobacco Use: Low Risk  (07/30/2024)   SDOH Interventions:     Readmission Risk Interventions    02/03/2024    9:40 AM  Readmission Risk Prevention Plan  Post Dischage Appt Complete  Medication Screening Complete  Transportation Screening Complete

## 2024-08-04 NOTE — Procedures (Signed)
 Interventional Radiology Procedure:   Indications: Thrombocytopenia and Von Willebrand   Procedure: Image guided bone marrow biopsy  Findings: 2 aspirates and 1 core from right ilium  Complications: None     EBL: Minimal, less than 10 ml  Plan: Bedrest 1 hour   Amontae Ng R. Philip, MD  Pager: [phone]
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
## 2024-08-04 NOTE — Progress Notes (Signed)
 Mobility Specialist Progress Note:    08/04/24 1235  Mobility  Activity Ambulated with assistance  Level of Assistance Standby assist, set-up cues, supervision of patient - no hands on  Assistive Device Four wheel walker  Distance Ambulated (ft) 500 ft  Activity Response Tolerated well  Mobility Referral Yes  Mobility visit 1 Mobility  Mobility Specialist Start Time (ACUTE ONLY) 1235  Mobility Specialist Stop Time (ACUTE ONLY) 1306  Mobility Specialist Time Calculation (min) (ACUTE ONLY) 31 min   Pt in a pleasant mood eager to session. Pt noted that their energy levels werent as high d/t probably not eating but still wanted to continue moving. No c/o any symptoms. Returned pt to recliner w/ all needs met.   Venetia Keel Mobility Specialist Please Neurosurgeon or Rehab Office at [phone]

## 2024-08-05 DIAGNOSIS — I5023 Acute on chronic systolic (congestive) heart failure: Secondary | ICD-10-CM | POA: Diagnosis not present

## 2024-08-05 LAB — BASIC METABOLIC PANEL WITH GFR
Anion gap: 10 (ref 5–15)
BUN: 30 mg/dL — ABNORMAL HIGH (ref 8–23)
CO2: 27 mmol/L (ref 22–32)
Calcium: 8.7 mg/dL — ABNORMAL LOW (ref 8.9–10.3)
Chloride: 100 mmol/L (ref 98–111)
Creatinine, Ser: 0.89 mg/dL (ref 0.44–1.00)
GFR, Estimated: >60 mL/min (ref >60)
Glucose, Bld: 85 mg/dL (ref 70–99)
Potassium: 3.7 mmol/L (ref 3.5–5.1)
Sodium: 137 mmol/L (ref 135–145)

## 2024-08-05 LAB — CBC WITH DIFFERENTIAL/PLATELET
Abs Immature Granulocytes: 0.03 K/uL (ref 0.00–0.07)
Basophils Absolute: 0.1 K/uL (ref 0.0–0.1)
Basophils Relative: 1 %
Eosinophils Absolute: 0.3 K/uL (ref 0.0–0.5)
Eosinophils Relative: 4 %
HCT: 39.6 % (ref 36.0–46.0)
Hemoglobin: 13.1 g/dL (ref 12.0–15.0)
Immature Granulocytes: 1 %
Lymphocytes Relative: 22 %
Lymphs Abs: 1.4 K/uL (ref 0.7–4.0)
MCH: 30.4 pg (ref 26.0–34.0)
MCHC: 33.1 g/dL (ref 30.0–36.0)
MCV: 91.9 fL (ref 80.0–100.0)
Monocytes Absolute: 0.7 K/uL (ref 0.1–1.0)
Monocytes Relative: 10 %
Neutro Abs: 3.9 K/uL (ref 1.7–7.7)
Neutrophils Relative %: 62 %
Platelets: 54 K/uL — ABNORMAL LOW (ref 150–400)
RBC: 4.31 MIL/uL (ref 3.87–5.11)
RDW: 14.5 % (ref 11.5–15.5)
WBC: 6.3 K/uL (ref 4.0–10.5)
nRBC: 0 % (ref 0.0–0.2)

## 2024-08-05 LAB — LIPOPROTEIN A (LPA): Lipoprotein (a): 32.1 nmol/L — ABNORMAL HIGH (ref <75.0)

## 2024-08-05 MED ORDER — POTASSIUM CHLORIDE CRYS ER 20 MEQ PO TBCR
20.0000 meq | EXTENDED_RELEASE_TABLET | Freq: Once | ORAL | Status: AC
Start: 2024-08-05 — End: 2024-08-05
  Administered 2024-08-05: 20 meq via ORAL
  Filled 2024-08-05: qty 1

## 2024-08-05 MED ORDER — FUROSEMIDE 10 MG/ML IJ SOLN
40.0000 mg | Freq: Once | INTRAMUSCULAR | Status: AC
  Administered 2024-08-05: 40 mg via INTRAVENOUS
  Filled 2024-08-05: qty 4

## 2024-08-05 NOTE — Plan of Care (Signed)
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
## 2024-08-05 NOTE — Progress Notes (Signed)
 Rounding Note    Patient Name: Meredith Dominguez Date of Encounter: 08/05/2024  [facility] Cardiologist: Shelda Bruckner, MD   Subjective   Seen by HF yesterday, recommend medical management and diuresis as tolerated. NAEO  Inpatient Medications    Scheduled Meds:  atorvastatin   40 mg Oral Daily   empagliflozin   10 mg Oral Daily   lidocaine   20 mL Infiltration Once   polyethylene glycol  17 g Oral Daily   sodium chloride  flush  3 mL Intravenous Q12H   spironolactone   12.5 mg Oral Daily   Continuous Infusions:   PRN Meds: acetaminophen  **OR** acetaminophen , acetaminophen , melatonin, ondansetron  (ZOFRAN ) IV, ondansetron  **OR** ondansetron  (ZOFRAN ) IV, sodium chloride  flush, sodium chloride  flush, sorbitol    Vital Signs    Vitals:   08/04/24 1955 08/04/24 2359 08/05/24 0411 08/05/24 0735  BP: 98/69 98/63 100/61 (!) 91/56  Pulse: 80 66 67 64  Resp: 19 12 18 17   Temp: (!) 97.3 F (36.3 C) (!) 97.4 F (36.3 C) (!) 97.5 F (36.4 C) 97.7 F (36.5 C)  TempSrc: Oral Oral Oral Oral  SpO2: 98% 99% 95% 96%  Weight:   59 kg   Height:        Intake/Output Summary (Last 24 hours) at 08/05/2024 0902 Last data filed at 08/04/2024 1700 Gross per 24 hour  Intake --  Output 700 ml  Net -700 ml      08/05/2024    4:11 AM 08/04/2024   12:19 AM 08/03/2024    4:50 AM  Last 3 Weights  Weight (lbs) 130 lb 124 lb 12.5 oz 134 lb 4.2 oz  Weight (kg) 58.968 kg 56.6 kg 60.9 kg      Telemetry    SR with PVCs - Personally Reviewed  Physical Exam   GEN: No acute distress.   Neck: No JVD visualized sitting upright Cardiac: RRR, no murmurs, rubs, or gallops.  Respiratory: Clear to auscultation bilaterally in upper fields, slightly diminished at bilateral bases GI: Soft, nontender, non-distended  MS: Bilateral 1+ edema with skin wrinkling starting; No deformity. Neuro:  Nonfocal  Psych: Normal affect   New pertinent results (labs, ECG, imaging, cardiac  studies)      Assessment & Plan    Acute systolic and diastolic heart failure Cardiomyopathy, likely ischemic based on cath -recent diagnosis -has diuresed well, intermittently held due to hypotension.  -LE edema remains, though she reports significant improved, discussed that this can take significant time to resolve.  -reviewed her cath results at length today, which showed three vessel disease, including 80% proximal LAD, CTO of Lcx at proximal segment with left-left collaterals from septal perforator, 90% proximal RCA -RHC with RA mean 9, RV 51/6 (14), PA 36/9 (22), wedge 25. CO 6, CI 3.5, PAPi 3. -plan had been to afterload reduce and continue diuresis.  -low blood pressure limits titration of GDMT. Had to stop losartan , spironolactone , lasix  for now given orthostasis and low blood pressure. Will try to add back as able. Tolerating Jardiance  -CT surgery and Advanced HF favor medical management.  - AHF added spironolactone  12.5 mg daily.  - continue jardiance . - D/w Dr. Fairy, will plan lasix  40 mg x 1 again today and monitor.    Coronary artery disease Hyperlipidemia - multidisciplinary discussions of prior teams recommend medical management. -declines statins. Would use PCKS9i -started atorvastatin  40 mg daily however patient refused. I have asked her to try this today. Discussed in detail, she has a friend who did not tolerate  and she has avoided lifelong. She is willing to try today.  - no aspirin  due to thrombocytopenia, however Dr. Timmy says in his note this may be permissible. Will monitor.   Von Willebrand Factor Chronic thrombocytopenia Discussed at length in setting of both PCI and need for DAPT and then long term antiplatelet vs perioperative management with CABG - no antiplatelets currently. BMBx results pending, may want to await these results.      Signed, Soyla DELENA Merck, MD  08/05/2024, 9:02 AM

## 2024-08-05 NOTE — Progress Notes (Signed)
 Sounds like she is not can have cardiac surgery.  Sounds that this would be a little bit too risky.  I totally understand this.  She had a bone marrow biopsy done yesterday.  I told her that the results probably would not be out until Tuesday or Wednesday of next week, at the earliest.  She has had no problems of bleeding.  She has diuresed quite well.  She is sleeping okay.  She is trying to eat a little bit more.  She is really worried about her diet and when she is back to her assisted living facility.  I know that Cardiology will have her on a protocol to try to help with her heart failure.  If she does have to get put on a antiplatelet drug, I think that this could be done.  Her labs show a sodium 137.  Potassium 3.7.  BUN 30 creatinine 0.89.  Calcium  8.7.  Her white cell count is 6.6.  Hemoglobin 13.9.  Platelet count 55,000.  She has had no problems with bowels or bladder.  She is out of bed.  She has had no cough or increased shortness of breath.  Again, she is to be be put on a medication protocol that will help with her heart failure.   Her vital signs show a temperature of 97.5.  Pulse 67.  Blood pressure 100/61.  Lungs sound clear bilaterally.  She has good air movement bilaterally.  Cardiac exam regular rate and rhythm.  She has no murmurs, rubs or bruits.  Abdomen is soft.  Bowel sounds are active.  There is no fluid wave.  There is no palpable liver or spleen tip.  Extremities shows no clubbing, cyanosis or edema.  There may be a little bit of swelling in the lower legs.  Neurological exam is nonfocal.  Hopefully, the cardiac function will improve.  Will see what the bone marrow biopsy results are.  Again, I would not expect these for 5 or 6 days.  Maybe, should be able to go home soon.  Again, she is incredibly worried about her diet and the salt that is given to her with her meals.  I am unsure how this can be addressed.  I do know that she has gotten incredible care from  everybody [facility] [address].   Jeralyn Crease, MD  Psalm 113:3

## 2024-08-06 ENCOUNTER — Other Ambulatory Visit (HOSPITAL_COMMUNITY): Payer: Self-pay

## 2024-08-06 ENCOUNTER — Encounter: Payer: Self-pay | Admitting: Hematology & Oncology

## 2024-08-06 DIAGNOSIS — I5023 Acute on chronic systolic (congestive) heart failure: Secondary | ICD-10-CM | POA: Diagnosis not present

## 2024-08-06 LAB — CBC WITH DIFFERENTIAL/PLATELET
Abs Immature Granulocytes: 0.02 K/uL (ref 0.00–0.07)
Basophils Absolute: 0.1 K/uL (ref 0.0–0.1)
Basophils Relative: 1 %
Eosinophils Absolute: 0.3 K/uL (ref 0.0–0.5)
Eosinophils Relative: 4 %
HCT: 40.7 % (ref 36.0–46.0)
Hemoglobin: 13.5 g/dL (ref 12.0–15.0)
Immature Granulocytes: 0 %
Lymphocytes Relative: 24 %
Lymphs Abs: 1.6 K/uL (ref 0.7–4.0)
MCH: 30.8 pg (ref 26.0–34.0)
MCHC: 33.2 g/dL (ref 30.0–36.0)
MCV: 92.7 fL (ref 80.0–100.0)
Monocytes Absolute: 0.7 K/uL (ref 0.1–1.0)
Monocytes Relative: 10 %
Neutro Abs: 4.1 K/uL (ref 1.7–7.7)
Neutrophils Relative %: 61 %
Platelets: 55 K/uL — ABNORMAL LOW (ref 150–400)
RBC: 4.39 MIL/uL (ref 3.87–5.11)
RDW: 14.4 % (ref 11.5–15.5)
WBC: 6.7 K/uL (ref 4.0–10.5)
nRBC: 0 % (ref 0.0–0.2)

## 2024-08-06 LAB — BASIC METABOLIC PANEL WITH GFR
Anion gap: 10 (ref 5–15)
BUN: 33 mg/dL — ABNORMAL HIGH (ref 8–23)
CO2: 27 mmol/L (ref 22–32)
Calcium: 8.9 mg/dL (ref 8.9–10.3)
Chloride: 99 mmol/L (ref 98–111)
Creatinine, Ser: 1.02 mg/dL — ABNORMAL HIGH (ref 0.44–1.00)
GFR, Estimated: 57 mL/min — ABNORMAL LOW (ref >60)
Glucose, Bld: 80 mg/dL (ref 70–99)
Potassium: 3.9 mmol/L (ref 3.5–5.1)
Sodium: 136 mmol/L (ref 135–145)

## 2024-08-06 MED ORDER — SPIRONOLACTONE 25 MG PO TABS
12.5000 mg | ORAL_TABLET | Freq: Every day | ORAL | 0 refills | Status: DC
  Filled 2024-08-06: qty 30, 60d supply, fill #0

## 2024-08-06 MED ORDER — FUROSEMIDE 40 MG PO TABS
40.0000 mg | ORAL_TABLET | Freq: Every day | ORAL | Status: DC
  Administered 2024-08-06: 40 mg via ORAL
  Filled 2024-08-06: qty 1

## 2024-08-06 MED ORDER — ATORVASTATIN CALCIUM 40 MG PO TABS
40.0000 mg | ORAL_TABLET | Freq: Every day | ORAL | 0 refills | Status: DC
  Filled 2024-08-06: qty 30, 30d supply, fill #0

## 2024-08-06 MED ORDER — EMPAGLIFLOZIN 10 MG PO TABS
10.0000 mg | ORAL_TABLET | Freq: Every day | ORAL | 1 refills | Status: DC
  Filled 2024-08-06: qty 30, 30d supply, fill #0

## 2024-08-06 MED ORDER — FUROSEMIDE 40 MG PO TABS
40.0000 mg | ORAL_TABLET | Freq: Every day | ORAL | 1 refills | Status: DC
  Filled 2024-08-06: qty 30, 30d supply, fill #0
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
## 2024-08-06 NOTE — Progress Notes (Signed)
 Rounding Note    Patient Name: Meredith Dominguez Date of Encounter: 08/06/2024  [facility] Cardiologist: Shelda Bruckner, MD   Subjective   NAEO. Tolerated atorvastatin  without incident.   Inpatient Medications    Scheduled Meds:  atorvastatin   40 mg Oral Daily   empagliflozin   10 mg Oral Daily   furosemide   40 mg Oral Daily   lidocaine   20 mL Infiltration Once   polyethylene glycol  17 g Oral Daily   sodium chloride  flush  3 mL Intravenous Q12H   spironolactone   12.5 mg Oral Daily   Continuous Infusions:   PRN Meds: acetaminophen  **OR** acetaminophen , acetaminophen , melatonin, ondansetron  (ZOFRAN ) IV, ondansetron  **OR** ondansetron  (ZOFRAN ) IV, sodium chloride  flush, sodium chloride  flush, sorbitol    Vital Signs    Vitals:   08/06/24 0321 08/06/24 0500 08/06/24 0801 08/06/24 0922  BP: 100/64  100/62 104/71  Pulse: 63  71 81  Resp: 18  18   Temp: 97.8 F (36.6 C)  97.7 F (36.5 C)   TempSrc: Oral  Oral   SpO2: 99%  99%   Weight:  54 kg    Height:        Intake/Output Summary (Last 24 hours) at 08/06/2024 1024 Last data filed at 08/06/2024 0915 Gross per 24 hour  Intake 360 ml  Output 1575 ml  Net -1215 ml      08/06/2024    5:00 AM 08/05/2024    4:11 AM 08/04/2024   12:19 AM  Last 3 Weights  Weight (lbs) 119 lb 0.8 oz 130 lb 124 lb 12.5 oz  Weight (kg) 54 kg 58.968 kg 56.6 kg      Telemetry    SR with PVCs - Personally Reviewed  Physical Exam   GEN: No acute distress.   Neck: No JVD visualized sitting upright Cardiac: RRR, no murmurs, rubs, or gallops.  Respiratory: Clear to auscultation bilaterally in upper fields, slightly diminished at bilateral bases GI: Soft, nontender, non-distended  MS: trace edema; No deformity. Neuro:  Nonfocal  Psych: Normal affect   New pertinent results (labs, ECG, imaging, cardiac studies)      Assessment & Plan    Acute systolic and diastolic heart failure Cardiomyopathy, likely  ischemic based on cath -recent diagnosis -has diuresed well, intermittently held due to hypotension.  -LE edema remains, though she reports significant improved, discussed that this can take significant time to resolve.  -reviewed her cath results at length today, which showed three vessel disease, including 80% proximal LAD, CTO of Lcx at proximal segment with left-left collaterals from septal perforator, 90% proximal RCA -RHC with RA mean 9, RV 51/6 (14), PA 36/9 (22), wedge 25. CO 6, CI 3.5, PAPi 3. -plan had been to afterload reduce and continue diuresis.  -low blood pressure limits titration of GDMT. Had to stop losartan , spironolactone , lasix  for now given orthostasis and low blood pressure. Tolerating Jardiance  -CT surgery and Advanced HF favor medical management.  - AHF added spironolactone  12.5 mg daily.  - continue jardiance . - tx to oral lasix  homegoing.    Coronary artery disease Hyperlipidemia - multidisciplinary discussions of prior teams recommend medical management. -declines statins. Would use PCKS9i -started atorvastatin  40 mg daily tolerating fine. - no aspirin  due to thrombocytopenia, however Dr. Timmy says in his note this may be permissible. Will monitor.   Von Willebrand Factor Chronic thrombocytopenia Discussed at length in setting of both PCI and need for DAPT and then long term antiplatelet vs perioperative management with CABG -  no antiplatelets currently. BMBx results pending, may want to await these results.    F/u arranged 9/24. Anticipating hospital dc today, stable from cv perspective.  Signed, Soyla DELENA Merck, MD  08/06/2024, 10:24 AM

## 2024-08-06 NOTE — Plan of Care (Signed)
°  Problem: Clinical Measurements: Goal: Will remain free from infection Outcome: Progressing   Problem: Activity: Goal: Risk for activity intolerance will decrease Outcome: Progressing   Problem: Nutrition: Goal: Adequate nutrition will be maintained Outcome: Progressing

## 2024-08-09 LAB — SURGICAL PATHOLOGY

## 2024-08-14 ENCOUNTER — Encounter (HOSPITAL_COMMUNITY): Payer: Self-pay | Admitting: Hematology and Oncology

## 2024-08-15 ENCOUNTER — Telehealth (HOSPITAL_COMMUNITY): Payer: Self-pay

## 2024-08-15 NOTE — Telephone Encounter (Signed)
 Called to confirm/remind patient of their appointment at the [facility] on 08/16/24.   Appointment:   [x] Confirmed  [] Left mess   [] No answer/No voice mail  [] VM Full/unable to leave message  [] Phone not in service  Patient reminded to bring all medications and/or complete list.  Confirmed patient has transportation. Gave directions, instructed to utilize valet parking.

## 2024-08-16 ENCOUNTER — Ambulatory Visit (HOSPITAL_COMMUNITY): Payer: Self-pay | Admitting: Family Medicine

## 2024-08-16 ENCOUNTER — Encounter: Payer: Self-pay | Admitting: Hematology & Oncology

## 2024-08-16 ENCOUNTER — Telehealth (HOSPITAL_COMMUNITY): Payer: Self-pay

## 2024-08-16 ENCOUNTER — Encounter (HOSPITAL_COMMUNITY): Payer: Self-pay

## 2024-08-16 ENCOUNTER — Ambulatory Visit (HOSPITAL_COMMUNITY)
Admit: 2024-08-16 | Discharge: 2024-08-16 | Disposition: A | Discharge status: Home / Self Care | Attending: Family Medicine | Admitting: Family Medicine

## 2024-08-16 ENCOUNTER — Other Ambulatory Visit (HOSPITAL_COMMUNITY): Payer: Self-pay

## 2024-08-16 VITALS — BP 106/58 | HR 70 | Ht 65.0 in | Wt 130.2 lb

## 2024-08-16 DIAGNOSIS — D6800 Von Willebrand disease, unspecified: Secondary | ICD-10-CM | POA: Diagnosis not present

## 2024-08-16 DIAGNOSIS — I5082 Biventricular heart failure: Secondary | ICD-10-CM | POA: Diagnosis not present

## 2024-08-16 DIAGNOSIS — I5022 Chronic systolic (congestive) heart failure: Secondary | ICD-10-CM | POA: Diagnosis not present

## 2024-08-16 DIAGNOSIS — E8581 Light chain (AL) amyloidosis: Secondary | ICD-10-CM | POA: Insufficient documentation

## 2024-08-16 DIAGNOSIS — I255 Ischemic cardiomyopathy: Secondary | ICD-10-CM | POA: Diagnosis not present

## 2024-08-16 DIAGNOSIS — Z87828 Personal history of other (healed) physical injury and trauma: Secondary | ICD-10-CM | POA: Insufficient documentation

## 2024-08-16 DIAGNOSIS — Z79899 Other long term (current) drug therapy: Secondary | ICD-10-CM | POA: Insufficient documentation

## 2024-08-16 DIAGNOSIS — R9431 Abnormal electrocardiogram [ECG] [EKG]: Secondary | ICD-10-CM | POA: Diagnosis not present

## 2024-08-16 DIAGNOSIS — I2510 Atherosclerotic heart disease of native coronary artery without angina pectoris: Secondary | ICD-10-CM

## 2024-08-16 DIAGNOSIS — D696 Thrombocytopenia, unspecified: Secondary | ICD-10-CM | POA: Insufficient documentation

## 2024-08-16 LAB — BASIC METABOLIC PANEL WITH GFR
Anion gap: 12 (ref 5–15)
BUN: 32 mg/dL — ABNORMAL HIGH (ref 8–23)
CO2: 23 mmol/L (ref 22–32)
Calcium: 9.3 mg/dL (ref 8.9–10.3)
Chloride: 103 mmol/L (ref 98–111)
Creatinine, Ser: 1.02 mg/dL — ABNORMAL HIGH (ref 0.44–1.00)
GFR, Estimated: 57 mL/min — ABNORMAL LOW (ref >60)
Glucose, Bld: 80 mg/dL (ref 70–99)
Potassium: 4.6 mmol/L (ref 3.5–5.1)
Sodium: 138 mmol/L (ref 135–145)

## 2024-08-16 LAB — BRAIN NATRIURETIC PEPTIDE: B Natriuretic Peptide: 731.9 pg/mL — ABNORMAL HIGH (ref 0.0–100.0)

## 2024-08-16 MED ORDER — SPIRONOLACTONE 25 MG PO TABS: 12.5000 mg | ORAL_TABLET | Freq: Every day | ORAL | 3 refills | Status: AC

## 2024-08-16 MED ORDER — EMPAGLIFLOZIN 10 MG PO TABS: 10.0000 mg | ORAL_TABLET | Freq: Every day | ORAL | 5 refills | Status: AC

## 2024-08-16 MED ORDER — ATORVASTATIN CALCIUM 40 MG PO TABS: 40.0000 mg | ORAL_TABLET | Freq: Every day | ORAL | 3 refills | Status: AC

## 2024-08-16 MED ORDER — FUROSEMIDE 40 MG PO TABS: 40.0000 mg | ORAL_TABLET | Freq: Every day | ORAL | 3 refills | Status: DC

## 2024-08-16 NOTE — Telephone Encounter (Signed)
 Advanced [facility] Failure Patient Advocate Encounter  The patient was approved for a [employer] grant that will help cover the cost of Jardiance , Spironolactone .  Total amount awarded, $7,500.  Effective: 07/17/2024 - 07/16/2025.  BIN [id number] PCN [id number] Group [id number] ID [id number]  Pharmacy provided with approval and processing information. Confirmed $0 copay for Jardiance . Patient informed via voicemail.  Rachel DEL, CPhT Rx Patient Advocate Phone: [phone]

## 2024-08-16 NOTE — Progress Notes (Signed)
 ReDS Vest / Clip - 08/16/24 9062       ReDS Vest / Clip   Station Marker A    Ruler Value 26    ReDS Value Range Low volume    ReDS Actual Value 27
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
## 2024-08-16 NOTE — Patient Instructions (Addendum)
 Medication Changes:  HEART MEDICATIONS REFILLED   PHARMACY STAFF IS WORKING ON YOUR JARDIANCE  GRANT- THEY WILL CALL YOU   Lab Work:  Labs done today, your results will be available in MyChart, we will contact you for abnormal readings.  Testing/Procedures:  ECHOCARDIOGRAM IN 3 MONTHS AS SCHEDULED   Referrals:  YOU HAVE BEEN REFERRED TO CARDIAC REHAB THEY WILL REACH OUT TO YOU OR CALL TO ARRANGE THIS. PLEASE CALL US  WITH ANY CONCERNS   Follow-Up in: 2-3 WEEKS AS SCHEDULED WITH APP, AND THEN AGAIN IN 3 MONTHS WITH DR. CHERRIE AS SCHEDULED   At the [facility], you and your health needs are our priority. We have a designated team specialized in the treatment of Heart Failure. This Care Team includes your primary Heart Failure Specialized Cardiologist (physician), Advanced Practice Providers (APPs- Physician Assistants and Nurse Practitioners), and Pharmacist who all work together to provide you with the care you need, when you need it.   You may see any of the following providers on your designated Care Team at your next follow up:  Dr. Toribio CHERRIE Dr. Ezra Shuck Dr. Ria Commander Dr. Odis Brownie Greig Mosses, NP Caffie Shed, [place] [facility] [place], [place] Beckey Coe, NP [place] Lee, NP Tinnie Redman, PharmD   Please be sure to bring in all your medications bottles to every appointment.   Need to Contact Us :  If you have any questions or concerns before your next appointment please send us  a message through [place] or call our office at [phone].    TO LEAVE A MESSAGE FOR THE NURSE SELECT OPTION 2, PLEASE LEAVE A MESSAGE INCLUDING: YOUR NAME DATE OF BIRTH CALL BACK NUMBER REASON FOR CALL**this is important as we prioritize the call backs  YOU WILL RECEIVE A CALL BACK THE SAME DAY AS LONG AS YOU CALL BEFORE 4:00 PM

## 2024-08-17 ENCOUNTER — Inpatient Hospital Stay: Attending: Medical Oncology

## 2024-08-17 ENCOUNTER — Inpatient Hospital Stay (HOSPITAL_BASED_OUTPATIENT_CLINIC_OR_DEPARTMENT_OTHER): Admitting: Medical Oncology

## 2024-08-17 ENCOUNTER — Encounter: Payer: Self-pay | Admitting: Medical Oncology

## 2024-08-17 VITALS — BP 100/57 | HR 66 | Temp 97.9°F | Resp 18 | Ht 65.0 in | Wt 130.0 lb

## 2024-08-17 DIAGNOSIS — D472 Monoclonal gammopathy: Secondary | ICD-10-CM

## 2024-08-17 DIAGNOSIS — D62 Acute posthemorrhagic anemia: Secondary | ICD-10-CM

## 2024-08-17 DIAGNOSIS — D68020 Von Willebrand disease, type 2a: Secondary | ICD-10-CM

## 2024-08-17 DIAGNOSIS — D509 Iron deficiency anemia, unspecified: Secondary | ICD-10-CM

## 2024-08-17 DIAGNOSIS — D696 Thrombocytopenia, unspecified: Secondary | ICD-10-CM | POA: Insufficient documentation

## 2024-08-17 DIAGNOSIS — D500 Iron deficiency anemia secondary to blood loss (chronic): Secondary | ICD-10-CM | POA: Insufficient documentation

## 2024-08-17 LAB — CMP (CANCER CENTER ONLY)
ALT: 23 U/L (ref 0–44)
AST: 22 U/L (ref 15–41)
Albumin: 4.2 g/dL (ref 3.5–5.0)
Alkaline Phosphatase: 96 U/L (ref 38–126)
Anion gap: 13 (ref 5–15)
BUN: 32 mg/dL — ABNORMAL HIGH (ref 8–23)
CO2: 23 mmol/L (ref 22–32)
Calcium: 9.5 mg/dL (ref 8.9–10.3)
Chloride: 105 mmol/L (ref 98–111)
Creatinine: 1.06 mg/dL — ABNORMAL HIGH (ref 0.44–1.00)
GFR, Estimated: 55 mL/min — ABNORMAL LOW (ref >60)
Glucose, Bld: 82 mg/dL (ref 70–99)
Potassium: 4.7 mmol/L (ref 3.5–5.1)
Sodium: 141 mmol/L (ref 135–145)
Total Bilirubin: 0.7 mg/dL (ref 0.0–1.2)
Total Protein: 7 g/dL (ref 6.5–8.1)

## 2024-08-17 LAB — CBC WITH DIFFERENTIAL (CANCER CENTER ONLY)
Abs Immature Granulocytes: 0.03 K/uL (ref 0.00–0.07)
Basophils Absolute: 0.1 K/uL (ref 0.0–0.1)
Basophils Relative: 1 %
Eosinophils Absolute: 0.2 K/uL (ref 0.0–0.5)
Eosinophils Relative: 3 %
HCT: 45.3 % (ref 36.0–46.0)
Hemoglobin: 14.7 g/dL (ref 12.0–15.0)
Immature Granulocytes: 0 %
Lymphocytes Relative: 16 %
Lymphs Abs: 1.2 K/uL (ref 0.7–4.0)
MCH: 30.2 pg (ref 26.0–34.0)
MCHC: 32.5 g/dL (ref 30.0–36.0)
MCV: 93 fL (ref 80.0–100.0)
Monocytes Absolute: 0.6 K/uL (ref 0.1–1.0)
Monocytes Relative: 9 %
Neutro Abs: 5.1 K/uL (ref 1.7–7.7)
Neutrophils Relative %: 71 %
Platelet Count: 70 K/uL — ABNORMAL LOW (ref 150–400)
RBC: 4.87 MIL/uL (ref 3.87–5.11)
RDW: 14.5 % (ref 11.5–15.5)
WBC Count: 7.2 K/uL (ref 4.0–10.5)
nRBC: 0 % (ref 0.0–0.2)

## 2024-08-17 LAB — LACTATE DEHYDROGENASE: LDH: 195 U/L — ABNORMAL HIGH (ref 98–192)

## 2024-08-17 LAB — APTT: aPTT: 31 s (ref 24–36)
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
## 2024-08-18 LAB — KAPPA/LAMBDA LIGHT CHAINS
Kappa free light chain: 34.7 mg/L — ABNORMAL HIGH (ref 3.3–19.4)
Kappa, lambda light chain ratio: 0.08 — ABNORMAL LOW (ref 0.26–1.65)
Lambda free light chains: 447.3 mg/L — ABNORMAL HIGH (ref 5.7–26.3)

## 2024-08-21 ENCOUNTER — Telehealth (HOSPITAL_COMMUNITY): Payer: Self-pay | Admitting: Licensed Clinical Social Worker

## 2024-08-21 LAB — MULTIPLE MYELOMA PANEL, SERUM
Albumin SerPl Elph-Mcnc: 3.4 g/dL (ref 2.9–4.4)
Albumin/Glob SerPl: 1.2 (ref 0.7–1.7)
Alpha 1: 0.3 g/dL (ref 0.0–0.4)
Alpha2 Glob SerPl Elph-Mcnc: 0.8 g/dL (ref 0.4–1.0)
B-Globulin SerPl Elph-Mcnc: 0.9 g/dL (ref 0.7–1.3)
Gamma Glob SerPl Elph-Mcnc: 1 g/dL (ref 0.4–1.8)
Globulin, Total: 3 g/dL (ref 2.2–3.9)
IgA: 151 mg/dL (ref 64–422)
IgG (Immunoglobin G), Serum: 1062 mg/dL (ref 586–1602)
IgM (Immunoglobulin M), Srm: 122 mg/dL (ref 26–217)
Total Protein ELP: 6.4 g/dL (ref 6.0–8.5)

## 2024-08-21 NOTE — Telephone Encounter (Signed)
 H&V Care Navigation CSW Progress Note  Clinical Social Worker consulted to reach out to pt regarding transportation.    CSW called pt to discuss transportation concerns.  States she pays for transportation to get to some appts and then has PCS services through her LTC insurance and they can also sometimes take her.  Interested if there are any other transportation resources.    CSW provided with information about Senior Wheels in [employer] as well as [employer].  Also encouraged her to call her [facility] Medicare to see if she has transportation benefits through them.  Pt also expressed concerns with paying for the right kind of food she should be eating given her heart condition.  Makes $4,300/month through retirement and pension so would not qualify for food stamps.  Discussed food pantries but she is not interested in taking food from people who are more needy.  CSW answered other questions pt had related to insurance.  Reports no other concerns at this time- provided with my contact information if further questions arise and mailing out transportation resources for reference.  SDOH Screenings   Food Insecurity: No Food Insecurity (07/30/2024)  Housing: Low Risk  (07/30/2024)  Transportation Needs: No Transportation Needs (08/21/2024)  Utilities: Not At Risk (07/30/2024)  Alcohol Screen: Low Risk  (10/19/2022)  Depression (PHQ2-9): Low Risk  (08/17/2024)  Financial Resource Strain: Medium Risk (08/21/2024)  Physical Activity: Insufficiently Active (10/13/2021)  Social Connections: Moderately Integrated (07/30/2024)  Stress: No Stress Concern Present (10/19/2022)  Tobacco Use: Low Risk  (08/17/2024)   Meredith HILARIO Leech, LCSW Clinical Social Worker [facility] Desk#: [phone] Cell#: [phone]

## 2024-08-22 ENCOUNTER — Ambulatory Visit: Payer: Self-pay | Admitting: Medical Oncology

## 2024-08-22 LAB — COAG STUDIES INTERP REPORT

## 2024-08-22 LAB — VON WILLEBRAND PANEL
Coagulation Factor VIII: 123 % (ref 56–140)
Ristocetin Co-factor, Plasma: 43 % — ABNORMAL LOW (ref 50–200)
Von Willebrand Antigen, Plasma: 155 % (ref 50–200)

## 2024-08-24 ENCOUNTER — Telehealth (HOSPITAL_COMMUNITY): Payer: Self-pay

## 2024-08-24 NOTE — Telephone Encounter (Signed)
 Called patient to go over cardiac rehab program, patient states she would like her referral sent to [facility].  Faxing referral to [facility].

## 2024-08-31 ENCOUNTER — Telehealth (HOSPITAL_COMMUNITY): Payer: Self-pay

## 2024-08-31 NOTE — Telephone Encounter (Signed)
 Called to confirm/remind patient of their appointment at the [facility] on 09/01/24.   Appointment:   [x] Confirmed  [] Left mess   [] No answer/No voice mail  [] VM Full/unable to leave message  [] Phone not in service  Patient reminded to bring all medications and/or complete list.  Confirmed patient has transportation. Gave directions, instructed to utilize valet parking.

## 2024-09-01 ENCOUNTER — Encounter (HOSPITAL_COMMUNITY): Payer: Self-pay

## 2024-09-01 ENCOUNTER — Ambulatory Visit (HOSPITAL_COMMUNITY)
Admission: RE | Admit: 2024-09-01 | Discharge: 2024-09-01 | Disposition: A | Discharge status: Home / Self Care | Source: Ambulatory Visit | Attending: Physician Assistant | Admitting: Physician Assistant

## 2024-09-01 VITALS — BP 102/68 | HR 63 | Ht 65.0 in | Wt 129.6 lb

## 2024-09-01 DIAGNOSIS — Z7984 Long term (current) use of oral hypoglycemic drugs: Secondary | ICD-10-CM | POA: Diagnosis not present

## 2024-09-01 DIAGNOSIS — I5022 Chronic systolic (congestive) heart failure: Secondary | ICD-10-CM

## 2024-09-01 DIAGNOSIS — I5082 Biventricular heart failure: Secondary | ICD-10-CM | POA: Insufficient documentation

## 2024-09-01 DIAGNOSIS — I255 Ischemic cardiomyopathy: Secondary | ICD-10-CM | POA: Diagnosis not present

## 2024-09-01 DIAGNOSIS — D696 Thrombocytopenia, unspecified: Secondary | ICD-10-CM | POA: Insufficient documentation

## 2024-09-01 DIAGNOSIS — Z79899 Other long term (current) drug therapy: Secondary | ICD-10-CM | POA: Diagnosis not present

## 2024-09-01 DIAGNOSIS — I2510 Atherosclerotic heart disease of native coronary artery without angina pectoris: Secondary | ICD-10-CM | POA: Diagnosis not present

## 2024-09-01 DIAGNOSIS — D6800 Von Willebrand disease, unspecified: Secondary | ICD-10-CM | POA: Diagnosis not present

## 2024-09-01 DIAGNOSIS — R7689 Other specified abnormal immunological findings in serum: Secondary | ICD-10-CM | POA: Insufficient documentation

## 2024-09-01 MED ORDER — METOPROLOL SUCCINATE ER 25 MG PO TB24: 12.5000 mg | ORAL_TABLET | Freq: Every day | ORAL | 3 refills | Status: AC

## 2024-09-01 MED ORDER — FUROSEMIDE 40 MG PO TABS: ORAL_TABLET | ORAL | 3 refills | Status: AC
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
## 2024-09-01 NOTE — Patient Instructions (Addendum)
 Medication Changes:  START METOPROLOL SUCCINATE 12.5MG  ONCE DAILY AT BED TIME   TAKE FUROSEMIDE  40MG  ONE DAY, ALTERNATING WITH 20MG  THE NEXT DAY---IF YOU FEEL LIKE YOUR GAINING FLUID YOU  MAY TAKE 40MG  DAILY FOR 1 WEEK   Follow-Up in: 4 weeks as scheduled with APP   And then again as scheduled with Dr. Cherrie in December.   At the [facility], you and your health needs are our priority. We have a designated team specialized in the treatment of Heart Failure. This Care Team includes your primary Heart Failure Specialized Cardiologist (physician), Advanced Practice Providers (APPs- Physician Assistants and Nurse Practitioners), and Pharmacist who all work together to provide you with the care you need, when you need it.   You may see any of the following providers on your designated Care Team at your next follow up:  Dr. Toribio Cherrie Dr. Ezra Shuck Dr. Ria Commander Dr. Odis Brownie Greig Mosses, NP Caffie Shed, [place] [facility] [place], [place] Beckey Coe, NP [place] Lee, NP Tinnie Redman, PharmD   Please be sure to bring in all your medications bottles to every appointment.   Need to Contact Us :  If you have any questions or concerns before your next appointment please send us  a message through [place] or call our office at [phone].    TO LEAVE A MESSAGE FOR THE NURSE SELECT OPTION 2, PLEASE LEAVE A MESSAGE INCLUDING: YOUR NAME DATE OF BIRTH CALL BACK NUMBER REASON FOR CALL**this is important as we prioritize the call backs  YOU WILL RECEIVE A CALL BACK THE SAME DAY AS LONG AS YOU CALL BEFORE 4:00 PM

## 2024-09-04 ENCOUNTER — Telehealth (HOSPITAL_COMMUNITY): Payer: Self-pay

## 2024-09-04 NOTE — Telephone Encounter (Signed)
  ADVANCED HEART FAILURE CLINIC   Pre-operative Risk Assessment   HEARTCARE STAFF-IMPORTANT INSTRUCTIONS 1 Red and Blue Text will auto delete once note is signed or closed. 2 Press F2 to navigate through template.   3 On drop down lists, L click to select >> R click to activate next field 4 Reason for Visit format is IMPORTANT!!  See Directions on No. 2 below. 5 Please review chart to determine if there is already a clearance note open for this procedure!!  DO NOT duplicate if a note already exists!!    :1}     Request for Surgical Clearance    Procedure:  Dental Treatment  Date of Surgery:  Clearance TBD                                 Surgeon:  Lacinda Gaskin, DMD Surgeon's Group or Practice Name:  [facility] Phone number:  [phone] Fax number:  [phone]   Type of Clearance Requested:   - Medical  - Pharmacy:  Hold if they need to hold any medications and if they need antibiotics prior to treatment       Type of Anesthesia:  Not Indicated Jes  Additional requests/questions:    Bonney Lisa CHRISTELLA Marcelina   09/04/2024, 9:36 AM   [facility] Harlene Gainer, FNP  [facility] [address] [facility] [place] [address] [phone] (office) [phone] (fax)

## 2024-09-04 NOTE — Telephone Encounter (Signed)
 Clearance faxed to [facility] group via Epic

## 2024-09-07 ENCOUNTER — Encounter (HOSPITAL_COMMUNITY)

## 2024-09-11 NOTE — Addendum Note (Signed)
 Encounter addended by: Dante Jeannine HERO, CMA on: 09/11/2024 1:55 PM  Actions taken: Charge Capture section accepted

## 2024-09-20 ENCOUNTER — Inpatient Hospital Stay (HOSPITAL_BASED_OUTPATIENT_CLINIC_OR_DEPARTMENT_OTHER): Admitting: Hematology & Oncology

## 2024-09-20 ENCOUNTER — Inpatient Hospital Stay: Attending: Medical Oncology

## 2024-09-20 ENCOUNTER — Encounter: Payer: Self-pay | Admitting: Hematology & Oncology

## 2024-09-20 VITALS — BP 108/66 | HR 52 | Temp 97.6°F | Resp 20 | Ht 65.0 in | Wt 130.4 lb

## 2024-09-20 DIAGNOSIS — D68020 Von Willebrand disease, type 2a: Secondary | ICD-10-CM | POA: Insufficient documentation

## 2024-09-20 DIAGNOSIS — D509 Iron deficiency anemia, unspecified: Secondary | ICD-10-CM

## 2024-09-20 DIAGNOSIS — D472 Monoclonal gammopathy: Secondary | ICD-10-CM

## 2024-09-20 DIAGNOSIS — Z882 Allergy status to sulfonamides status: Secondary | ICD-10-CM | POA: Diagnosis not present

## 2024-09-20 DIAGNOSIS — R002 Palpitations: Secondary | ICD-10-CM | POA: Insufficient documentation

## 2024-09-20 DIAGNOSIS — D696 Thrombocytopenia, unspecified: Secondary | ICD-10-CM | POA: Diagnosis not present

## 2024-09-20 DIAGNOSIS — D62 Acute posthemorrhagic anemia: Secondary | ICD-10-CM

## 2024-09-20 DIAGNOSIS — R0602 Shortness of breath: Secondary | ICD-10-CM | POA: Insufficient documentation

## 2024-09-20 DIAGNOSIS — Z79899 Other long term (current) drug therapy: Secondary | ICD-10-CM | POA: Insufficient documentation

## 2024-09-20 DIAGNOSIS — I509 Heart failure, unspecified: Secondary | ICD-10-CM | POA: Diagnosis not present

## 2024-09-20 DIAGNOSIS — I2510 Atherosclerotic heart disease of native coronary artery without angina pectoris: Secondary | ICD-10-CM | POA: Diagnosis not present

## 2024-09-20 DIAGNOSIS — M2550 Pain in unspecified joint: Secondary | ICD-10-CM | POA: Diagnosis not present

## 2024-09-20 DIAGNOSIS — M791 Myalgia, unspecified site: Secondary | ICD-10-CM | POA: Insufficient documentation

## 2024-09-20 DIAGNOSIS — R110 Nausea: Secondary | ICD-10-CM | POA: Insufficient documentation

## 2024-09-20 LAB — IRON AND IRON BINDING CAPACITY (CC-WL,HP ONLY)
Iron: 91 ug/dL (ref 28–170)
Saturation Ratios: 31 % (ref 10.4–31.8)
TIBC: 290 ug/dL (ref 250–450)
UIBC: 199 ug/dL

## 2024-09-20 LAB — CBC WITH DIFFERENTIAL (CANCER CENTER ONLY)
Abs Immature Granulocytes: 0.03 K/uL (ref 0.00–0.07)
Basophils Absolute: 0.1 K/uL (ref 0.0–0.1)
Basophils Relative: 1 %
Eosinophils Absolute: 0.3 K/uL (ref 0.0–0.5)
Eosinophils Relative: 4 %
HCT: 44.7 % (ref 36.0–46.0)
Hemoglobin: 14.7 g/dL (ref 12.0–15.0)
Immature Granulocytes: 0 %
Lymphocytes Relative: 15 %
Lymphs Abs: 1.2 K/uL (ref 0.7–4.0)
MCH: 29.6 pg (ref 26.0–34.0)
MCHC: 32.9 g/dL (ref 30.0–36.0)
MCV: 89.9 fL (ref 80.0–100.0)
Monocytes Absolute: 0.7 K/uL (ref 0.1–1.0)
Monocytes Relative: 9 %
Neutro Abs: 5.5 K/uL (ref 1.7–7.7)
Neutrophils Relative %: 71 %
Platelet Count: 66 K/uL — ABNORMAL LOW (ref 150–400)
RBC: 4.97 MIL/uL (ref 3.87–5.11)
RDW: 14.3 % (ref 11.5–15.5)
WBC Count: 7.7 K/uL (ref 4.0–10.5)
nRBC: 0 % (ref 0.0–0.2)

## 2024-09-20 LAB — FERRITIN: Ferritin: 566 ng/mL — ABNORMAL HIGH (ref 11–307)

## 2024-09-20 LAB — CMP (CANCER CENTER ONLY)
ALT: 20 U/L (ref 0–44)
AST: 23 U/L (ref 15–41)
Albumin: 4.2 g/dL (ref 3.5–5.0)
Alkaline Phosphatase: 104 U/L (ref 38–126)
Anion gap: 12 (ref 5–15)
BUN: 29 mg/dL — ABNORMAL HIGH (ref 8–23)
CO2: 22 mmol/L (ref 22–32)
Calcium: 9.3 mg/dL (ref 8.9–10.3)
Chloride: 105 mmol/L (ref 98–111)
Creatinine: 1.03 mg/dL — ABNORMAL HIGH (ref 0.44–1.00)
GFR, Estimated: 56 mL/min — ABNORMAL LOW (ref >60)
Glucose, Bld: 91 mg/dL (ref 70–99)
Potassium: 5.1 mmol/L (ref 3.5–5.1)
Sodium: 139 mmol/L (ref 135–145)
Total Bilirubin: 0.7 mg/dL (ref 0.0–1.2)
Total Protein: 7 g/dL (ref 6.5–8.1)

## 2024-09-20 LAB — LACTATE DEHYDROGENASE: LDH: 181 U/L (ref 98–192)

## 2024-09-20 NOTE — Progress Notes (Signed)
 Hematology and Oncology Follow Up Visit  Meredith Dominguez [id number] [birth year] 73 y.o. 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  Presently, she is back in the hospital.  Back on 07/30/2024, she was admitted.  She unfortunately was admitted with congestive heart failure.  She actually had a echocardiogram which showed an ejection fraction of only 25-30%.  She had a pleural effusion.  She was diuresed.  She lost quite a bit of weight.  She is being followed by cardiology.  She did have a cardiac cath.  This is on 08/02/2024.  This showed three-vessel coronary artery disease.  She had severe left ventricular systolic dysfunction.  He was offered to her to have a bypass.  She and the cardiac team finally felt that this would be too risky of a procedure..  She is placed on medication.  While in the hospital, she did have a bone marrow biopsy done.  This is because of her chronically elevated light chains.  The bone marrow biopsy was done on 08/04/2024.  The pathology report ([id number]) showed a lambda restricted plasma cell process of only 7%.  Otherwise, she had mature bone marrow hematopoiesis.  She did have adequate megakaryocytes despite having chronic thrombocytopenia.  The cytogenetics on the bone marrow were normal.  While in the hospital, she did have a 24-hour urine.  This probably did not show any lambda light chain.  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  She will see her cardiologist and have another echocardiogram.  She has had no bleeding.  She has had no nausea or vomiting.  There has been no change in bowel or bladder habits.  She is watching her weight.  She is monitoring her weight  closely.  Overall, I would have to say that her performance status right now is probably ECOG 2.   Wt Readings from Last 3 Encounters:  09/20/24 130 lb 6.4 oz (59.1 kg)  09/01/24 129 lb 9.6 oz (58.8 kg)  08/17/24 130 lb (59 kg)    Medications:  Allergies as of 09/20/2024       Reactions   Monosodium Glutamate Other (See Comments)   Hot and feels badly with fatigue, Flushing   Red Dye #40 (allura Red) Other (See Comments)   NO DYES!!   Gluten Meal Other (See Comments)   Sensitivity   Other Other (See Comments)   Red places near the collarbone- might be connected to Von Willebrand disease    Sesame Oil Rash   Turns bright, ear and anus open and close.   Tomato Other (See Comments)   And, other night shade vegetables like eggplants   Wheat Other (See Comments)   Stomach issues- can tolerate organic wheat   Sulfa Antibiotics Rash   Sulfa Antibiotics Rash        Medication List        Accurate as of September 20, 2024  2:00 PM. If you have any questions, ask your nurse or doctor.          atorvastatin  40 MG tablet Commonly known as: LIPITOR Take 1 tablet (40 mg total) by mouth daily.   CALCIUM  & VIT D3 BONE HEALTH PO Take 1 tablet by mouth in the morning,  at noon, and at bedtime.   CALCIUM  LACTATE PO Take 3 tablets by mouth daily as needed (for bleeds).   empagliflozin  10 MG Tabs tablet Commonly known as: JARDIANCE  Take 1 tablet (10 mg total) by mouth daily.   folic acid  1 MG tablet Commonly known as: FOLVITE  Take 2 tablets (2 mg total) by mouth daily.   furosemide  40 MG tablet Commonly known as: LASIX  TAKE FUROSEMIDE  40MG  ONE DAY, ALTERNATING WITH 20MG  THE NEXT DAY---IF YOU FEEL LIKE YOUR GAINING FLUID YOU  MAY TAKE 40MG  DAILY FOR 1 WEEK   MAGNESIUM  CITRATE PO Take 150 mg by mouth daily.   metoprolol succinate 25 MG 24 hr tablet Commonly known as: TOPROL-XL Take 0.5 tablets (12.5 mg total) by mouth daily. Take with or immediately following a meal.    NON FORMULARY Take 9 drops by mouth See admin instructions. Coxsackie 200C Bioactive Nutritional- Place 9 drops in the mouth a day as needed for heart health   OVER THE COUNTER MEDICATION Take 1 tablet by mouth. HYDROXO B12 WITH FOLINIC ACID - Chew 1 tablet by mouth once a day as needed to combat Von Willebrand disease   OVER THE COUNTER MEDICATION daily. Ferro food.   OVER THE COUNTER MEDICATION Take 1 tablet by mouth in the morning and at bedtime. For-Til B12 Supplement   OVER THE COUNTER MEDICATION Take 1 tablet by mouth daily. PurDentix Oral Health   spironolactone  25 MG tablet Commonly known as: ALDACTONE  Take 0.5 tablet (12.5 mg total) by mouth daily.   UNABLE TO FIND Take 1 tablet by mouth daily. Med Name: Cyruta Plus        Allergies:  Allergies  Allergen Reactions   Monosodium Glutamate Other (See Comments)    Hot and feels badly with fatigue, Flushing   Red Dye #40 (Allura Red) Other (See Comments)    NO DYES!!   Gluten Meal Other (See Comments)    Sensitivity    Other Other (See Comments)    Red places near the collarbone- might be connected to Von Willebrand disease    Sesame Oil Rash    Turns bright, ear and anus open and close.   Tomato Other (See Comments)    And, other night shade vegetables like eggplants   Wheat Other (See Comments)    Stomach issues- can tolerate organic wheat   Sulfa Antibiotics Rash   Sulfa Antibiotics Rash    Past Medical History, Surgical history, Social history, and Family History were reviewed and updated.  Review of Systems:  Review of Systems  Constitutional: Negative.   HENT: Negative.    Respiratory:  Positive for shortness of breath.   Cardiovascular:  Positive for palpitations.  Gastrointestinal:  Positive for nausea.  Genitourinary: Negative.   Musculoskeletal:  Positive for joint pain and myalgias.  Skin: Negative.   Neurological: Negative.   Endo/Heme/Allergies:  Bruises/bleeds easily.   Psychiatric/Behavioral: Negative.       Physical Exam:  height is 5' 5 (1.651 m) and weight is 130 lb 6.4 oz (59.1 kg). Her oral temperature is 97.6 F (36.4 C). Her blood pressure is 108/66 and her pulse is 52 (abnormal). Her respiration is 20 and oxygen saturation is 99%.   Wt Readings from Last 3 Encounters:  09/20/24 130 lb 6.4 oz (59.1 kg)  09/01/24 129 lb 9.6 oz (58.8 kg)  08/17/24 130 lb (59 kg)    Physical Exam Vitals reviewed.  HENT:     Head: Normocephalic and atraumatic.  Eyes:  Pupils: Pupils are equal, round, and reactive to light.  Cardiovascular:     Rate and Rhythm: Normal rate and regular rhythm.     Heart sounds: Normal heart sounds.  Pulmonary:     Effort: Pulmonary effort is normal.     Breath sounds: Normal breath sounds.  Abdominal:     General: Bowel sounds are normal.     Palpations: Abdomen is soft.  Musculoskeletal:        General: No tenderness or deformity. Normal range of motion.     Cervical back: Normal range of motion.  Lymphadenopathy:     Cervical: No cervical adenopathy.  Skin:    General: Skin is warm and dry.     Findings: No erythema or rash.  Neurological:     Mental Status: She is alert and oriented to person, place, and time.  Psychiatric:        Behavior: Behavior normal.        Thought Content: Thought content normal.        Judgment: Judgment normal.     Lab Results  Component Value Date   WBC 7.7 09/20/2024   HGB 14.7 09/20/2024   HCT 44.7 09/20/2024   MCV 89.9 09/20/2024   PLT 66 (L) 09/20/2024   Lab Results  Component Value Date   FERRITIN 205 02/16/2024   IRON 39 05/25/2024   TIBC 242 (L) 05/25/2024   UIBC 203 05/25/2024   IRONPCTSAT 16 05/25/2024   Lab Results  Component Value Date   RBC 4.97 09/20/2024   Lab Results  Component Value Date   KPAFRELGTCHN 34.7 (H) 08/17/2024   LAMBDASER 447.3 (H) 08/17/2024   KAPLAMBRATIO 0.08 (L) 08/17/2024   Lab Results  Component Value Date   IGGSERUM  1,062 08/17/2024   IGA 151 08/17/2024   IGMSERUM 122 08/17/2024   Lab Results  Component Value Date   TOTALPROTELP 6.4 08/17/2024   ALBUMINELP 3.8 06/20/2024   A1GS 0.2 06/20/2024   A2GS 0.5 06/20/2024   BETS 0.6 (L) 06/20/2024   GAMS 0.9 06/20/2024   MSPIKE Not Observed 06/20/2024   SPEI Comment 06/20/2024     Chemistry      Component Value Date/Time   NA 139 09/20/2024 1212   K 5.1 09/20/2024 1212   CL 105 09/20/2024 1212   CO2 22 09/20/2024 1212   BUN 29 (H) 09/20/2024 1212   CREATININE 1.03 (H) 09/20/2024 1212      Component Value Date/Time   CALCIUM  9.3 09/20/2024 1212   ALKPHOS 104 09/20/2024 1212   AST 23 09/20/2024 1212   ALT 20 09/20/2024 1212   BILITOT 0.7 09/20/2024 1212       Impression and Plan:  Ms. Whack is a a very pleasant 73 yo caucasian female with history of type IIA Von Willebrand disease. 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   Clearly, the primary now is cardiac.  Hopefully, we will see that her echocardiogram will be better with an improved ejection fraction.  She really does look good today.  I would think that the cardiology regimen that she is off her heart is doing what it needs to be doing to help with diuresis and keeping her weight steady.  I am glad that the bone marrow biopsy did not show any evidence of myeloma.  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  In fact, not even her urine shows any monoclonal protein.  Her platelet count is on the lower side.  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  We can certainly treat this if necessary for any kind of surgical procedure that she may have done.  At this point, I feel confident that we can now get her through the Holiday season.  I do not think that she has any very much active hematologic issues that we really have to treat right now.  However, if she does get admitted to the hospital, we will see her  and help out. Maude JONELLE Crease, MD 10/29/20252:00 PM

## 2024-09-21 ENCOUNTER — Other Ambulatory Visit

## 2024-09-21 ENCOUNTER — Ambulatory Visit: Admitting: Hematology & Oncology

## 2024-09-21 LAB — KAPPA/LAMBDA LIGHT CHAINS
Kappa free light chain: 30.8 mg/L — ABNORMAL HIGH (ref 3.3–19.4)
Kappa, lambda light chain ratio: 0.08 — ABNORMAL LOW (ref 0.26–1.65)
Lambda free light chains: 370.3 mg/L — ABNORMAL HIGH (ref 5.7–26.3)

## 2024-09-26 LAB — MULTIPLE MYELOMA PANEL, SERUM
Albumin SerPl Elph-Mcnc: 3.4 g/dL (ref 2.9–4.4)
Albumin/Glob SerPl: 1.1 (ref 0.7–1.7)
Alpha 1: 0.3 g/dL (ref 0.0–0.4)
Alpha2 Glob SerPl Elph-Mcnc: 0.9 g/dL (ref 0.4–1.0)
B-Globulin SerPl Elph-Mcnc: 0.8 g/dL (ref 0.7–1.3)
Gamma Glob SerPl Elph-Mcnc: 1.1 g/dL (ref 0.4–1.8)
Globulin, Total: 3.1 g/dL (ref 2.2–3.9)
IgA: 131 mg/dL (ref 64–422)
IgG (Immunoglobin G), Serum: 1071 mg/dL (ref 586–1602)
IgM (Immunoglobulin M), Srm: 121 mg/dL (ref 26–217)
Total Protein ELP: 6.5 g/dL (ref 6.0–8.5)

## 2024-09-28 ENCOUNTER — Encounter: Payer: Self-pay | Admitting: Hematology & Oncology

## 2024-09-28 ENCOUNTER — Emergency Department (HOSPITAL_BASED_OUTPATIENT_CLINIC_OR_DEPARTMENT_OTHER)

## 2024-09-28 ENCOUNTER — Emergency Department (HOSPITAL_BASED_OUTPATIENT_CLINIC_OR_DEPARTMENT_OTHER)
Admission: EM | Admit: 2024-09-28 | Discharge: 2024-09-28 | Disposition: A | Discharge status: Home / Self Care | Attending: Emergency Medicine | Admitting: Emergency Medicine

## 2024-09-28 ENCOUNTER — Encounter (HOSPITAL_BASED_OUTPATIENT_CLINIC_OR_DEPARTMENT_OTHER): Payer: Self-pay

## 2024-09-28 ENCOUNTER — Other Ambulatory Visit: Payer: Self-pay

## 2024-09-28 DIAGNOSIS — S0990XA Unspecified injury of head, initial encounter: Secondary | ICD-10-CM | POA: Diagnosis present

## 2024-09-28 DIAGNOSIS — I509 Heart failure, unspecified: Secondary | ICD-10-CM | POA: Insufficient documentation

## 2024-09-28 DIAGNOSIS — W228XXA Striking against or struck by other objects, initial encounter: Secondary | ICD-10-CM | POA: Diagnosis not present

## 2024-09-28 DIAGNOSIS — S0081XA Abrasion of other part of head, initial encounter: Secondary | ICD-10-CM | POA: Insufficient documentation

## 2024-09-28 DIAGNOSIS — T148XXA Other injury of unspecified body region, initial encounter: Secondary | ICD-10-CM

## 2024-09-28 NOTE — ED Triage Notes (Signed)
 Pt brought in by sitter. States she was putting away item in freezer and stood up hit back of head . Denies LOC, No thinners. Has dx of clotting disorder Denies pain ,but feels electrical Scabbed area on head

## 2024-09-28 NOTE — ED Provider Notes (Signed)
 [facility] Provider Note  CSN: [id number] Arrival date & time: 09/28/24 1220  Chief Complaint(s) Head Injury  HPI Meredith Dominguez is a 73 y.o. female with past medical history as below, significant for CHF, von Willebrand disease, ischemic cardiomyopathy who presents to the ED with complaint of head injury  Patient reports she was reaching into her refrigerator and did not realize that the freezer door was open, when she lifted her head to exit the refrigerator she hit her head on the freezer door.  She had some bleeding initially which has since subsided.  She had no LOC, no other injuries reported.  She has been ambulatory since then.  Unsure of last tetanus shot.  Mostly felt prior to onset the symptoms.  Reports she currently is asymptomatic.  No thinners  Past Medical History Past Medical History:  Diagnosis Date   Acute exacerbation of CHF (congestive heart failure) (HCC) 07/30/2024   Anemia    Encounter for education about heart failure 08/01/2024   Kidney stones    Osteoporosis 05/15/2019   Pharyngitis 05/25/2017   Von Willebrand disease (HCC)    Patient Active Problem List   Diagnosis Date Noted   Ischemic cardiomyopathy 08/03/2024   Coronary artery disease involving native coronary artery of native heart without angina pectoris 08/03/2024   Acute combined systolic and diastolic heart failure (HCC) 08/01/2024   Encounter for education about heart failure 08/01/2024   Acute on chronic systolic CHF (congestive heart failure) (HCC) 07/30/2024   Thrombocytopenia 05/30/2024   Anemia 05/30/2024   Abdominal wall hematoma 05/22/2024   VWD (von Willebrand's disease) (HCC) 05/22/2024   Symptomatic anemia 02/03/2024   Hematoma 02/01/2024   Osteoporosis 05/15/2019   Pharyngitis 05/25/2017   Von Willebrand disease, type IIa (HCC) 07/29/2016   Acute blood loss anemia 07/29/2016   Thrombocytopenia 07/29/2016   Bleeding gums    Home  Medication(s) Prior to Admission medications   Medication Sig Start Date End Date Taking? Authorizing Provider  atorvastatin  (LIPITOR) 40 MG tablet Take 1 tablet (40 mg total) by mouth daily. 08/16/24   Glena Harlene HERO, FNP  CALCIUM  LACTATE PO Take 3 tablets by mouth daily as needed (for bleeds).    [provider]  empagliflozin  (JARDIANCE ) 10 MG TABS tablet Take 1 tablet (10 mg total) by mouth daily. 08/16/24   Milford, Harlene HERO, FNP  folic acid  (FOLVITE ) 1 MG tablet Take 2 tablets (2 mg total) by mouth daily. Patient not taking: Reported on 09/01/2024 05/31/24   Augustus Almarie RAMAN, PA-C  furosemide  (LASIX ) 40 MG tablet TAKE FUROSEMIDE  40MG  ONE DAY, ALTERNATING WITH 20MG  THE NEXT DAY---IF YOU FEEL LIKE YOUR GAINING FLUID YOU  MAY TAKE 40MG  DAILY FOR 1 WEEK 09/01/24   Colletta Manuelita Garre, PA-C  MAGNESIUM  CITRATE PO Take 150 mg by mouth daily.    [provider]  metoprolol succinate (TOPROL-XL) 25 MG 24 hr tablet Take 0.5 tablets (12.5 mg total) by mouth daily. Take with or immediately following a meal. 09/01/24   Colletta Manuelita Garre, PA-C  Multiple Minerals-Vitamins (CALCIUM  & VIT D3 BONE HEALTH PO) Take 1 tablet by mouth in the morning, at noon, and at bedtime.    [provider]  NON FORMULARY Take 9 drops by mouth See admin instructions. Coxsackie 200C Bioactive Nutritional- Place 9 drops in the mouth a day as needed for heart health Patient not taking: Reported on 09/20/2024    [provider]  OVER THE COUNTER MEDICATION  Take 1 tablet by mouth. HYDROXO B12 WITH FOLINIC ACID - Chew 1 tablet by mouth once a day as needed to combat Von Willebrand disease    [provider]  OVER THE COUNTER MEDICATION daily. Ferro food.    [provider]  OVER THE COUNTER MEDICATION Take 1 tablet by mouth in the morning and at bedtime. For-Til B12 Supplement Patient not taking: Reported on 09/20/2024    [provider]  OVER THE COUNTER  MEDICATION Take 1 tablet by mouth daily. PurDentix Oral Health    [provider]  spironolactone  (ALDACTONE ) 25 MG tablet Take 0.5 tablet (12.5 mg total) by mouth daily. 08/16/24   Milford, Harlene HERO, FNP  UNABLE TO FIND Take 1 tablet by mouth daily. Med Name: Cyruta Plus    [provider]                                                                                                                                    Past Surgical History Past Surgical History:  Procedure Laterality Date   BREAST SURGERY     Breast Biopsy Left x2   IR BONE MARROW BIOPSY  08/04/2024   RIGHT/LEFT HEART CATH AND CORONARY ANGIOGRAPHY N/A 08/02/2024   Procedure: RIGHT/LEFT HEART CATH AND CORONARY ANGIOGRAPHY;  Surgeon: Ladona Heinz, MD;  Location: [facility];  Service: Cardiovascular;  Laterality: N/A;   Family History Family History  Problem Relation Age of Onset   Leukemia Cousin    Cancer Mother    Diabetes Father    Heart disease Father    Bleeding Disorder Neg Hx     Social History Social History   Tobacco Use   Smoking status: Never   Smokeless tobacco: Never  Vaping Use   Vaping status: Never Used  Substance Use Topics   Alcohol use: Yes    Comment: wine once per week   Drug use: No   Allergies Monosodium glutamate, Red dye #40 (allura red), Gluten meal, Other, Sesame oil, Tomato, Wheat, Sulfa antibiotics, and Sulfa antibiotics  Review of Systems A thorough review of systems was obtained and all systems are negative except as noted in the HPI and PMH.   Physical Exam Vital Signs  I have reviewed the triage vital signs BP 119/66 (BP Location: Right Arm)   Pulse (!) 57   Temp 97.7 F (36.5 C) (Oral)   Resp 16   Ht 5' 5 (1.651 m)   Wt 58.1 kg   SpO2 100%   BMI 21.31 kg/m  Physical Exam Vitals and nursing note reviewed.  Constitutional:      General: She is not in acute distress.    Appearance: Normal appearance. She is well-developed. She is not  ill-appearing.  HENT:     Head: Normocephalic.     Comments: Abrasion to crown, no ongoing bleeding    Right Ear: External ear normal.  Left Ear: External ear normal.     Nose: Nose normal.     Mouth/Throat:     Mouth: Mucous membranes are moist.  Eyes:     General: No scleral icterus.       Right eye: No discharge.        Left eye: No discharge.  Cardiovascular:     Rate and Rhythm: Normal rate.  Pulmonary:     Effort: Pulmonary effort is normal. No respiratory distress.     Breath sounds: No stridor.  Abdominal:     General: Abdomen is flat. There is no distension.     Tenderness: There is no guarding.  Musculoskeletal:        General: No deformity.     Cervical back: No rigidity.  Skin:    General: Skin is warm and dry.     Coloration: Skin is not cyanotic, jaundiced or pale.  Neurological:     Mental Status: She is alert and oriented to person, place, and time.     GCS: GCS eye subscore is 4. GCS verbal subscore is 5. GCS motor subscore is 6.     Cranial Nerves: No dysarthria.     Motor: No tremor.  Psychiatric:        Speech: Speech normal.        Behavior: Behavior normal. Behavior is cooperative.     ED Results and Treatments Labs (all labs ordered are listed, but only abnormal results are displayed) Labs Reviewed - No data to display                                                                                                                        Radiology No results found.  Pertinent labs & imaging results that were available during my care of the patient were reviewed by me and considered in my medical decision making (see MDM for details).  Medications Ordered in ED Medications - No data to display                                                                                                                                   Procedures Procedures  (including critical care time)  Medical Decision Making / ED Course    Medical Decision  Making:    Eve Rey is a 73 y.o. female  with past medical history as below, significant for CHF, von Willebrand disease,  ischemic cardiomyopathy who presents to the ED with complaint of head injury. The complaint involves an extensive differential diagnosis and also carries with it a high risk of complications and morbidity.  Serious etiology was considered. Ddx includes but is not limited to: Differential diagnoses for head trauma includes subdural hematoma, epidural hematoma, acute concussion, traumatic subarachnoid hemorrhage, cerebral contusions, etc.   Complete initial physical exam performed, notably the patient was in no acute distress.    Reviewed and confirmed nursing documentation for past medical history, family history, social history.  Vital signs reviewed.    Minor head injury Scalp abrasion> -Patient has coagulopathy, CT head obtained which is reassuring. -He is neurologically intact, no headache.  Bleeding has subsided -Wound was cleaned, she was given bacitracin and wound care instructions for home -She is unsure of her last tetanus shot but wants to wait until she sees her PCP to decide on getting another tetanus shot. Discussed risks of delay in this  -concussion precautions   I have discussed the diagnosis/risks/treatment options with the patient.  Evaluation and diagnostic testing in the emergency department does not suggest an emergent condition requiring admission or immediate intervention beyond what has been performed at this time.  They will follow up with PCP. We also discussed returning to the ED immediately if new or worsening sx occur. We discussed the sx which are most concerning (e.g., sudden worsening pain, fever, inability to tolerate by mouth, severe headache) that necessitate immediate return.    The patient appears reasonably screened and/or stabilized for discharge and I doubt any other medical condition or other [facility] requiring further screening,  evaluation, or treatment in the ED at this time prior to discharge.                       Additional history obtained: -Additional history obtained from na -External records from outside source obtained and reviewed including: Chart review including previous notes, labs, imaging, consultation notes including  Allergy list, home medications   Lab Tests: Not applicable  EKG   EKG Interpretation Date/Time:    Ventricular Rate:    PR Interval:    QRS Duration:    QT Interval:    QTC Calculation:   R Axis:      Text Interpretation:           Imaging Studies ordered: I ordered imaging studies including CT head I independently visualized the following imaging with scope of interpretation limited to determining acute life threatening conditions related to emergency care; findings noted above I agree with the radiologist interpretation If any imaging was obtained with contrast I closely monitored patient for any possible adverse reaction a/w contrast administration in the emergency department   Medicines ordered and prescription drug management: No orders of the defined types were placed in this encounter.   -I have reviewed the patients home medicines and have made adjustments as needed   Consultations Obtained: Not applicable  Cardiac Monitoring: Continuous pulse oximetry interpreted by myself, 100% on RA.    Social Determinants of Health:  Diagnosis or treatment significantly limited by social determinants of health: na   Reevaluation: After the interventions noted above, I reevaluated the patient and found that they have resolved  Co morbidities that complicate the patient evaluation  Past Medical History:  Diagnosis Date   Acute exacerbation of CHF (congestive heart failure) (HCC) 07/30/2024   Anemia    Encounter for education about heart failure 08/01/2024   Kidney stones  Osteoporosis 05/15/2019   Pharyngitis 05/25/2017   Von Willebrand  disease (HCC)       Dispostion: Disposition decision including need for hospitalization was considered, and patient discharged from emergency department.    Final Clinical Impression(s) / ED Diagnoses Final diagnoses:  Minor head injury, initial encounter  Abrasion        Elnor Jayson LABOR, DO 09/30/24 1043
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
## 2024-09-28 NOTE — ED Notes (Signed)
 Pt transferred from WR to ED RM 4. Assuming pt care at this time.

## 2024-10-02 ENCOUNTER — Encounter (HOSPITAL_COMMUNITY)

## 2024-10-04 ENCOUNTER — Telehealth (HOSPITAL_COMMUNITY): Payer: Self-pay

## 2024-10-04 NOTE — Telephone Encounter (Signed)
 Called to confirm/remind patient of their appointment at the [facility] on 10/05/24.   Appointment:   [x] Confirmed  [] Left mess   [] No answer/No voice mail  [] VM Full/unable to leave message  [] Phone not in service  Patient reminded to bring all medications and/or complete list.  Confirmed patient has transportation. Gave directions, instructed to utilize valet parking.
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
## 2024-10-05 ENCOUNTER — Ambulatory Visit (HOSPITAL_COMMUNITY)
Admission: RE | Admit: 2024-10-05 | Discharge: 2024-10-05 | Disposition: A | Discharge status: Home / Self Care | Source: Ambulatory Visit | Attending: Cardiology | Admitting: Cardiology

## 2024-10-05 ENCOUNTER — Ambulatory Visit (HOSPITAL_COMMUNITY): Payer: Self-pay | Admitting: Cardiology

## 2024-10-05 ENCOUNTER — Encounter (HOSPITAL_COMMUNITY): Payer: Self-pay

## 2024-10-05 VITALS — BP 102/60 | HR 58 | Ht 65.0 in | Wt 130.0 lb

## 2024-10-05 DIAGNOSIS — I255 Ischemic cardiomyopathy: Secondary | ICD-10-CM | POA: Diagnosis not present

## 2024-10-05 DIAGNOSIS — R7989 Other specified abnormal findings of blood chemistry: Secondary | ICD-10-CM | POA: Diagnosis not present

## 2024-10-05 DIAGNOSIS — Z7984 Long term (current) use of oral hypoglycemic drugs: Secondary | ICD-10-CM | POA: Insufficient documentation

## 2024-10-05 DIAGNOSIS — Z79899 Other long term (current) drug therapy: Secondary | ICD-10-CM | POA: Diagnosis not present

## 2024-10-05 DIAGNOSIS — I5082 Biventricular heart failure: Secondary | ICD-10-CM | POA: Insufficient documentation

## 2024-10-05 DIAGNOSIS — D696 Thrombocytopenia, unspecified: Secondary | ICD-10-CM | POA: Insufficient documentation

## 2024-10-05 DIAGNOSIS — D6800 Von Willebrand disease, unspecified: Secondary | ICD-10-CM | POA: Diagnosis not present

## 2024-10-05 DIAGNOSIS — I5022 Chronic systolic (congestive) heart failure: Secondary | ICD-10-CM | POA: Diagnosis present

## 2024-10-05 DIAGNOSIS — I2510 Atherosclerotic heart disease of native coronary artery without angina pectoris: Secondary | ICD-10-CM | POA: Diagnosis not present

## 2024-10-05 LAB — COMPREHENSIVE METABOLIC PANEL WITH GFR
ALT: 20 U/L (ref 0–44)
AST: 20 U/L (ref 15–41)
Albumin: 3.5 g/dL (ref 3.5–5.0)
Alkaline Phosphatase: 81 U/L (ref 38–126)
Anion gap: 11 (ref 5–15)
BUN: 27 mg/dL — ABNORMAL HIGH (ref 8–23)
CO2: 23 mmol/L (ref 22–32)
Calcium: 9 mg/dL (ref 8.9–10.3)
Chloride: 106 mmol/L (ref 98–111)
Creatinine, Ser: 0.98 mg/dL (ref 0.44–1.00)
GFR, Estimated: >60 mL/min (ref >60)
Glucose, Bld: 83 mg/dL (ref 70–99)
Potassium: 4.6 mmol/L (ref 3.5–5.1)
Sodium: 140 mmol/L (ref 135–145)
Total Bilirubin: 1 mg/dL (ref 0.0–1.2)
Total Protein: 6.5 g/dL (ref 6.5–8.1)

## 2024-10-05 NOTE — Patient Instructions (Addendum)
 Good to see you today!   Labs done today, your results will be available in MyChart, we will contact you for abnormal readings.  Your physician recommends that you schedule a follow-up appointment as scheduled  If you have any questions or concerns before your next appointment please send us  a message through [place] or call our office at [phone].    TO LEAVE A MESSAGE FOR THE NURSE SELECT OPTION 2, PLEASE LEAVE A MESSAGE INCLUDING: YOUR NAME DATE OF BIRTH CALL BACK NUMBER REASON FOR CALL**this is important as we prioritize the call backs  YOU WILL RECEIVE A CALL BACK THE SAME DAY AS LONG AS YOU CALL BEFORE 4:00 PM At the [facility], you and your health needs are our priority. As part of our continuing mission to provide you with exceptional heart care, we have created designated Provider Care Teams. These Care Teams include your primary Cardiologist (physician) and Advanced Practice Providers (APPs- Physician Assistants and Nurse Practitioners) who all work together to provide you with the care you need, when you need it.   You may see any of the following providers on your designated Care Team at your next follow up: Dr Toribio Fuel Dr Ezra Shuck Dr. Morene Brownie Greig Mosses, NP Caffie Shed, [place] [facility] [place], [place] Beckey Coe, NP Jordan Lee, NP Ellouise Class, NP Tinnie Redman, PharmD Jaun Bash, PharmD   Please be sure to bring in all your medications bottles to every appointment.    Thank you for choosing [facility]

## 2024-10-16 ENCOUNTER — Ambulatory Visit (HOSPITAL_COMMUNITY)
Admission: RE | Admit: 2024-10-16 | Discharge: 2024-10-16 | Disposition: A | Discharge status: Home / Self Care | Source: Ambulatory Visit | Attending: Family Medicine | Admitting: Family Medicine

## 2024-10-16 DIAGNOSIS — I509 Heart failure, unspecified: Secondary | ICD-10-CM

## 2024-10-16 DIAGNOSIS — I255 Ischemic cardiomyopathy: Secondary | ICD-10-CM | POA: Insufficient documentation

## 2024-10-16 LAB — ECHOCARDIOGRAM COMPLETE
AR max vel: 1.81 cm2
AV Area VTI: 1.79 cm2
AV Area mean vel: 1.6 cm2
AV Mean grad: 2 mmHg
AV Peak grad: 3.5 mmHg
Ao pk vel: 0.93 m/s
Area-P 1/2: 3.31 cm2
S' Lateral: 4.5 cm

## 2024-10-17 ENCOUNTER — Telehealth (HOSPITAL_COMMUNITY): Payer: Self-pay | Admitting: *Deleted

## 2024-10-17 NOTE — Telephone Encounter (Signed)
 Called patient per Dr. Rosalynd with following echo results:  EF 25-30%, normal RV.  Pt verbalized understanding of same. She can discuss further at follow up with Dr. Cherrie next month.

## 2024-10-30 ENCOUNTER — Inpatient Hospital Stay (HOSPITAL_COMMUNITY): Admission: RE | Admit: 2024-10-30 | Discharge: 2024-10-30 | Attending: Internal Medicine | Admitting: Internal Medicine

## 2024-10-30 ENCOUNTER — Encounter (HOSPITAL_COMMUNITY): Payer: Self-pay | Admitting: Internal Medicine

## 2024-10-30 VITALS — BP 106/62 | HR 52 | Ht 65.7 in | Wt 130.4 lb

## 2024-10-30 DIAGNOSIS — I255 Ischemic cardiomyopathy: Secondary | ICD-10-CM

## 2024-10-30 DIAGNOSIS — D6800 Von Willebrand disease, unspecified: Secondary | ICD-10-CM

## 2024-10-30 DIAGNOSIS — I2510 Atherosclerotic heart disease of native coronary artery without angina pectoris: Secondary | ICD-10-CM

## 2024-10-30 DIAGNOSIS — E782 Mixed hyperlipidemia: Secondary | ICD-10-CM

## 2024-10-30 DIAGNOSIS — I5022 Chronic systolic (congestive) heart failure: Secondary | ICD-10-CM

## 2024-10-30 LAB — COMPREHENSIVE METABOLIC PANEL WITH GFR
ALT: 17 U/L (ref 0–44)
AST: 18 U/L (ref 15–41)
Albumin: 3.6 g/dL (ref 3.5–5.0)
Alkaline Phosphatase: 76 U/L (ref 38–126)
Anion gap: 7 (ref 5–15)
BUN: 24 mg/dL — ABNORMAL HIGH (ref 8–23)
CO2: 26 mmol/L (ref 22–32)
Calcium: 8.9 mg/dL (ref 8.9–10.3)
Chloride: 105 mmol/L (ref 98–111)
Creatinine, Ser: 1.03 mg/dL — ABNORMAL HIGH (ref 0.44–1.00)
GFR, Estimated: 57 mL/min — ABNORMAL LOW (ref >60)
Glucose, Bld: 78 mg/dL (ref 70–99)
Potassium: 4.5 mmol/L (ref 3.5–5.1)
Sodium: 138 mmol/L (ref 135–145)
Total Bilirubin: 1.2 mg/dL (ref 0.0–1.2)
Total Protein: 6.7 g/dL (ref 6.5–8.1)

## 2024-10-30 LAB — LIPID PANEL
Cholesterol: 138 mg/dL (ref 0–200)
HDL: 55 mg/dL (ref >40)
LDL Cholesterol: 72 mg/dL (ref 0–99)
Total CHOL/HDL Ratio: 2.5 ratio
Triglycerides: 53 mg/dL (ref <150)
VLDL: 11 mg/dL (ref 0–40)

## 2024-10-30 LAB — BRAIN NATRIURETIC PEPTIDE: B Natriuretic Peptide: 446.5 pg/mL — ABNORMAL HIGH (ref 0.0–100.0)

## 2024-10-30 NOTE — Addendum Note (Signed)
 Encounter addended by: Roseana Rhine B, RN on: 10/30/2024 11:27 AM  Actions taken: Flowsheet accepted, Clinical Note Signed
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
## 2024-10-30 NOTE — Addendum Note (Signed)
 Encounter addended by: Idalee Foxworthy B, RN on: 10/30/2024 11:13 AM  Actions taken: Clinical Note Signed, Order list changed, Diagnosis association updated, Charge Capture section accepted

## 2024-10-30 NOTE — Progress Notes (Signed)
 ReDS Vest / Clip - 10/30/24 1127       ReDS Vest / Clip   Station Marker A    Ruler Value 26    ReDS Value Range Low volume    ReDS Actual Value 30

## 2024-10-30 NOTE — Patient Instructions (Signed)
 Medication Changes:  No Changes In Medications at this time.   Lab Work:  Labs done today, your results will be available in MyChart, we will contact you for abnormal readings.  Follow-Up in: 4 months with Dr. Cherrie PLEASE CALL OUR OFFICE AROUND February 2026 TO GET SCHEDULED FOR YOUR APPOINTMENT. PHONE NUMBER IS [phone] OPTION 2   At the [facility], you and your health needs are our priority. We have a designated team specialized in the treatment of Heart Failure. This Care Team includes your primary Heart Failure Specialized Cardiologist (physician), Advanced Practice Providers (APPs- Physician Assistants and Nurse Practitioners), and Pharmacist who all work together to provide you with the care you need, when you need it.   You may see any of the following providers on your designated Care Team at your next follow up:  Dr. Toribio Cherrie Dr. Ezra Shuck Dr. Odis Brownie Greig Mosses, NP Caffie Shed, [place] [facility] [place], [place] Beckey Coe, NP Jordan Lee, NP Tinnie Redman, PharmD   Please be sure to bring in all your medications bottles to every appointment.   Need to Contact Us :  If you have any questions or concerns before your next appointment please send us  a message through [place] or call our office at [phone].    TO LEAVE A MESSAGE FOR THE NURSE SELECT OPTION 2, PLEASE LEAVE A MESSAGE INCLUDING: YOUR NAME DATE OF BIRTH CALL BACK NUMBER REASON FOR CALL**this is important as we prioritize the call backs  YOU WILL RECEIVE A CALL BACK THE SAME DAY AS LONG AS YOU CALL BEFORE 4:00 PM

## 2024-11-01 ENCOUNTER — Emergency Department (HOSPITAL_COMMUNITY): Admission: EM | Admit: 2024-11-01 | Discharge: 2024-11-01 | Disposition: A | Discharge status: Home / Self Care

## 2024-11-01 ENCOUNTER — Other Ambulatory Visit: Payer: Self-pay

## 2024-11-01 ENCOUNTER — Telehealth (HOSPITAL_COMMUNITY): Payer: Self-pay | Admitting: Cardiology

## 2024-11-01 DIAGNOSIS — R040 Epistaxis: Secondary | ICD-10-CM | POA: Diagnosis present

## 2024-11-01 DIAGNOSIS — I509 Heart failure, unspecified: Secondary | ICD-10-CM | POA: Insufficient documentation

## 2024-11-01 DIAGNOSIS — S00522A Blister (nonthermal) of oral cavity, initial encounter: Secondary | ICD-10-CM

## 2024-11-01 LAB — CBC WITH DIFFERENTIAL/PLATELET
Abs Immature Granulocytes: 0.06 K/uL (ref 0.00–0.07)
Basophils Absolute: 0 K/uL (ref 0.0–0.1)
Basophils Relative: 0 %
Eosinophils Absolute: 0.2 K/uL (ref 0.0–0.5)
Eosinophils Relative: 2 %
HCT: 43.9 % (ref 36.0–46.0)
Hemoglobin: 14.2 g/dL (ref 12.0–15.0)
Immature Granulocytes: 1 %
Lymphocytes Relative: 12 %
Lymphs Abs: 1.3 K/uL (ref 0.7–4.0)
MCH: 29.5 pg (ref 26.0–34.0)
MCHC: 32.3 g/dL (ref 30.0–36.0)
MCV: 91.3 fL (ref 80.0–100.0)
Monocytes Absolute: 0.8 K/uL (ref 0.1–1.0)
Monocytes Relative: 8 %
Neutro Abs: 7.9 K/uL — ABNORMAL HIGH (ref 1.7–7.7)
Neutrophils Relative %: 77 %
Platelets: 72 K/uL — ABNORMAL LOW (ref 150–400)
RBC: 4.81 MIL/uL (ref 3.87–5.11)
RDW: 15.1 % (ref 11.5–15.5)
WBC: 10.1 K/uL (ref 4.0–10.5)
nRBC: 0 % (ref 0.0–0.2)

## 2024-11-01 LAB — BASIC METABOLIC PANEL WITH GFR
Anion gap: 13 (ref 5–15)
BUN: 30 mg/dL — ABNORMAL HIGH (ref 8–23)
CO2: 22 mmol/L (ref 22–32)
Calcium: 9.4 mg/dL (ref 8.9–10.3)
Chloride: 105 mmol/L (ref 98–111)
Creatinine, Ser: 0.89 mg/dL (ref 0.44–1.00)
GFR, Estimated: >60 mL/min (ref >60)
Glucose, Bld: 77 mg/dL (ref 70–99)
Potassium: 4.2 mmol/L (ref 3.5–5.1)
Sodium: 140 mmol/L (ref 135–145)

## 2024-11-01 NOTE — Discharge Instructions (Addendum)
 As we discussed, your workup in the ER today was reassuring for acute findings.  Your platelets and hemoglobin were stable.  For your nosebleed, I recommend that tried to not blow your nose to help reduce the risk of recurrent bleeding.  If you do have recurrent bleeding then I recommend that you purchase Afrin spray which you can get over-the-counter at your local pharmacy to spray into your nose to help stop the bleeding.  Spray the 2 pumps of the Afrin spray and then hold pressure for at least 20 minutes.  For the sore in your mouth I really recommend that you try Orajel which you get over-the-counter at your local pharmacy to help with your pain.  Please try to chew on the opposite side your mouth to help prevent recurrent trauma to help the area heal appropriately.  Return if development of any new or worsening symptoms.

## 2024-11-01 NOTE — ED Provider Notes (Signed)
 [place] EMERGENCY DEPARTMENT AT [facility] Provider Note   CSN: [id number] Arrival date & time: 11/01/24  1002     Patient presents with: spit up blood and Epistaxis   Meredith Dominguez is a 73 y.o. female.   Patient with history of CHF, anemia, von Willebrand's presents today with complaints of epistaxis. Reports that last night she noticed that she was having some bleeding from her left nare. Reports this resolved quickly last night with direct pressure. She also noticed she had a sore in her mouth that was bleeding. Reports that this sore is from biting her inner cheek. This morning she spit into the sink when she got up and noticed there was blood streaking in her saliva. She has a history of thrombocytopenia and anemia and she was concerned that her platelets and hemoglobin were low. Reports that her nose and her mouth have not had active bleeding today. Currently denies any complaints.  The history is provided by the patient. No language interpreter was used.  Epistaxis      Prior to Admission medications   Medication Sig Start Date End Date Taking? Authorizing Provider  atorvastatin  (LIPITOR) 40 MG tablet Take 1 tablet (40 mg total) by mouth daily. 08/16/24   Glena Harlene HERO, FNP  CALCIUM  LACTATE PO Take 3 tablets by mouth daily as needed (for bleeds).    [provider]  empagliflozin  (JARDIANCE ) 10 MG TABS tablet Take 1 tablet (10 mg total) by mouth daily. 08/16/24   [place], Harlene HERO, FNP  folic acid  (FOLVITE ) 1 MG tablet Take 2 tablets (2 mg total) by mouth daily. Patient not taking: Reported on 10/30/2024 05/31/24   Augustus Almarie RAMAN, PA-C  furosemide  (LASIX ) 40 MG tablet TAKE FUROSEMIDE  40MG  ONE DAY, ALTERNATING WITH 20MG  THE NEXT DAY---IF YOU FEEL LIKE YOUR GAINING FLUID YOU  MAY TAKE 40MG  DAILY FOR 1 WEEK 09/01/24   Colletta Manuelita Garre, PA-C  MAGNESIUM  CITRATE PO Take 150 mg by mouth daily.    [provider]  metoprolol  succinate  (TOPROL -XL) 25 MG 24 hr tablet Take 0.5 tablets (12.5 mg total) by mouth daily. Take with or immediately following a meal. 09/01/24   Colletta Manuelita Garre, PA-C  Multiple Minerals-Vitamins (CALCIUM  & VIT D3 BONE HEALTH PO) Take 1 tablet by mouth in the morning, at noon, and at bedtime.    [provider]  NON FORMULARY Take 9 drops by mouth See admin instructions. Coxsackie 200C Bioactive Nutritional- Place 9 drops in the mouth a day as needed for heart health    [provider]  OVER THE COUNTER MEDICATION Take 1 tablet by mouth. HYDROXO B12 WITH FOLINIC ACID - Chew 1 tablet by mouth once a day as needed to combat Von Willebrand disease    [provider]  OVER THE COUNTER MEDICATION daily. Ferro food.    [provider]  OVER THE COUNTER MEDICATION Take 1 tablet by mouth in the morning and at bedtime. For-Til B12 Supplement    [provider]  OVER THE COUNTER MEDICATION Take 1 tablet by mouth daily. PurDentix Oral Health    [provider]  OVER THE COUNTER MEDICATION Take by mouth in the morning and at bedtime. PARAPLUS- FOR PARASITES    [provider]  spironolactone  (ALDACTONE ) 25 MG tablet Take 0.5 tablet (12.5 mg total) by mouth daily. 08/16/24   [place], Harlene HERO, FNP  UNABLE TO FIND Take 1 tablet by mouth daily. Med Name: Cyruta Plus  [provider]    Allergies: Monosodium glutamate, Red dye #40 (allura red), Gluten meal, Other, Sesame oil, Tomato, Wheat, Sulfa antibiotics, and Sulfa antibiotics    Review of Systems  HENT:  Positive for mouth sores and nosebleeds.   All other systems reviewed and are negative.   Updated Vital Signs BP 110/76 (BP Location: Left Arm)   Pulse (!) 57   Temp 97.8 F (36.6 C)   Resp 16   SpO2 98%   Physical Exam Vitals and nursing note reviewed.  Constitutional:      General: She is not in acute distress.    Appearance: Normal appearance. She is normal weight. She is not  ill-appearing, toxic-appearing or diaphoretic.  HENT:     Head: Normocephalic and atraumatic.     Nose:     Comments: Dried blood present in the left nare at Kiesselbach's plexus. No active bleeding    Mouth/Throat:     Comments: Small blood filled blister noted to the inner right buccal mucosa. No signs of bleeding. Superficial abrasion noted to the inner left buccal mucosa, no signs of bleeding.   No signs of bleeding in the oropharynx. Cardiovascular:     Rate and Rhythm: Normal rate.  Pulmonary:     Effort: Pulmonary effort is normal. No respiratory distress.  Musculoskeletal:        General: Normal range of motion.     Cervical back: Normal range of motion.  Skin:    General: Skin is warm and dry.  Neurological:     General: No focal deficit present.     Mental Status: She is alert.  Psychiatric:        Mood and Affect: Mood normal.        Behavior: Behavior normal.     (all labs ordered are listed, but only abnormal results are displayed) Labs Reviewed  CBC WITH DIFFERENTIAL/PLATELET - Abnormal; Notable for the following components:      Result Value   Platelets 72 (*)    Neutro Abs 7.9 (*)    All other components within normal limits  BASIC METABOLIC PANEL WITH GFR - Abnormal; Notable for the following components:   BUN 30 (*)    All other components within normal limits    EKG: None  Radiology: No results found.   Procedures   Medications Ordered in the ED - No data to display                                  Medical Decision Making Amount and/or Complexity of Data Reviewed Labs: ordered.   This patient is a 73 y.o. female who presents to the ED for concern of epistaxis, this involves an extensive number of treatment options, and is a complaint that carries with it a high risk of complications and morbidity. The emergent differential diagnosis prior to evaluation includes, but is not limited to,  thrombocytopenia, anemia  Past Medical History /  Co-morbidities / Social History:  has a past medical history of Acute exacerbation of CHF (congestive heart failure) (HCC) (07/30/2024), Anemia, Encounter for education about heart failure (08/01/2024), Kidney stones, Osteoporosis (05/15/2019), Pharyngitis (05/25/2017), and Von Willebrand disease (HCC).  Additional history: Chart reviewed.   Physical Exam: Physical exam performed. The pertinent findings include:   Dried blood present in the left nare at [facility] plexus. No active bleeding  Small blood filled blister noted to the inner right buccal  mucosa. No signs of bleeding. Superficial abrasion noted to the inner left buccal mucosa, no signs of bleeding.   No signs of bleeding in the oropharynx.  Lab Tests: I ordered, and personally interpreted labs.  The pertinent results include:  platelets 72, highest on recent record in previous months, hgb 14.2, BUN 30.   Disposition: After consideration of the diagnostic results and the patients response to treatment, I feel that emergency department workup does not suggest an emergent condition requiring admission or immediate intervention beyond what has been performed at this time. The plan is: Surgical to patient follow-up and return precautions. Patients hemoglobin and platelets are stable, she has no signs of active bleeding. Recommend humidifier at night and Afrin spray as needed. Offered silver nitrate cautery for management of her epistaxis, however patient does not want to do this at this time.  Evaluation and diagnostic testing in the emergency department does not suggest an emergent condition requiring admission or immediate intervention beyond what has been performed at this time.  Plan for discharge with close PCP follow-up.  Patient is understanding and amenable with plan, educated on red flag symptoms that would prompt immediate return.  Patient discharged in stable condition.  Final diagnoses:  Left-sided epistaxis  Blood blister of  oral cavity    ED Discharge Orders     None     An After Visit Summary was printed and given to the patient.      Duru Reiger A, PA-C 11/01/24 1716    Gennaro Duwaine CROME, DO 11/02/24 1511

## 2024-11-01 NOTE — Telephone Encounter (Signed)
 Copied from CRM #[id number]. Topic: Clinical Concerns - Emergent Call >> Nov 01, 2024  8:05 AM Harlene HERO wrote: Solyana, [place] is calling for clinical concerns (Ask: If symptom currently happening or happened earlier today? Must Review Emergent List for symptoms. Document Name of Triage Nurse/BH Rep taking the call when applicable)   Include all details related to the request(s) below:  Patient would like to know if she needs to go to the emergency room due to a lot of bleeding she is spitting up from her mouth. She thinks it is due to her medications. While telling me her symptoms she decided that she will go the emergency room and no need to get the nurse. PSC advised that message will still be placed so provider is aware. Patient understood    Confirm and type the Best Contact Number below:  Patient/caller contact number:  [phone]           [] Home  [x] Mobile  [] Work [] Other   [] Okay to leave a voicemail   Medication List:  Current Outpatient Medications:    atorvastatin  (LIPITOR) 40 mg tablet, Take 40 mg by mouth daily., Disp: , Rfl:    calcium  lactate 100 mg calcium  tab, Take 1 tablet (100 mg total) by mouth daily., Disp: , Rfl:    desmopressin  (DDAVP ) 10 mcg/spray (0.1 mL) solution, , Disp: , Rfl: 12   empagliflozin  (JARDIANCE ) 10 mg tab, Take 10 mg by mouth daily., Disp: , Rfl:    furosemide  (LASIX ) 40 mg tablet, TAKE FUROSEMIDE  40MG  ONE DAY, ALTERNATING WITH 20MG  THE NEXT DAY---IF YOU FEEL LIKE YOUR GAINING FLUID YOU  MAY TAKE 40MG  DAILY FOR 1 WEEK (Patient taking differently: 40 mg every other day, 20 mg the other days.), Disp: , Rfl:    magnesium  citrate 100 mg tab, Take 1 tablet (100 mg total) by mouth daily., Disp: , Rfl:    metoprolol  succinate (TOPROL  XL) 25 mg 24 hr tablet, Take 12.5 mg by mouth daily. take with or immediately following a meal, Disp: , Rfl:    spironolactone  (ALDACTONE ) 25 mg tablet, Take 12.5 mg by mouth., Disp: , Rfl:      Medication  Request/Refills: Pharmacy Information (if applicable)   [x] Not Applicable       []  Pharmacy listed  Send Medication Request to:                                                 [] Pharmacy not listed (added to pharmacy list in Epic) Send Medication Request to:      Listed Pharmacies: [id number] - [place], [place] - [address] AT [address] - PHONE: [phone] - FAX: [phone]

## 2024-11-01 NOTE — Telephone Encounter (Signed)
 Patient called to request review of mediation, would like to know if any of her cardiac medications can cause mouth sores   Reports she is currently in the ER for bright red nose/mouth bleed.reports event started last night and into early this AM. Once she passed a large clot out her mouth reported to ER   Please advise

## 2024-11-01 NOTE — ED Triage Notes (Signed)
 Pt. Stated, This morning I had a nose bleed and bleeding when I spit up. I did have a blood blister in my mouth

## 2024-11-06 NOTE — Telephone Encounter (Signed)
 Pt seen in ER

## 2024-12-19 ENCOUNTER — Inpatient Hospital Stay: Admitting: Hematology & Oncology

## 2024-12-19 ENCOUNTER — Inpatient Hospital Stay

## 2024-12-20 ENCOUNTER — Inpatient Hospital Stay: Admitting: Hematology & Oncology

## 2024-12-20 ENCOUNTER — Encounter: Payer: Self-pay | Admitting: Hematology & Oncology

## 2024-12-20 ENCOUNTER — Inpatient Hospital Stay: Attending: Hematology & Oncology

## 2024-12-20 VITALS — BP 94/49 | HR 53 | Temp 97.6°F | Resp 20 | Ht 65.0 in | Wt 131.1 lb

## 2024-12-20 DIAGNOSIS — D696 Thrombocytopenia, unspecified: Secondary | ICD-10-CM

## 2024-12-20 DIAGNOSIS — D68020 Von Willebrand disease, type 2a: Secondary | ICD-10-CM

## 2024-12-20 DIAGNOSIS — D472 Monoclonal gammopathy: Secondary | ICD-10-CM

## 2024-12-20 LAB — CBC WITH DIFFERENTIAL (CANCER CENTER ONLY)
Abs Immature Granulocytes: 0.01 10*3/uL (ref 0.00–0.07)
Basophils Absolute: 0.1 10*3/uL (ref 0.0–0.1)
Basophils Relative: 1 %
Eosinophils Absolute: 0.2 10*3/uL (ref 0.0–0.5)
Eosinophils Relative: 3 %
HCT: 44.2 % (ref 36.0–46.0)
Hemoglobin: 14.7 g/dL (ref 12.0–15.0)
Immature Granulocytes: 0 %
Lymphocytes Relative: 19 %
Lymphs Abs: 1.5 10*3/uL (ref 0.7–4.0)
MCH: 30.2 pg (ref 26.0–34.0)
MCHC: 33.3 g/dL (ref 30.0–36.0)
MCV: 90.8 fL (ref 80.0–100.0)
Monocytes Absolute: 0.6 10*3/uL (ref 0.1–1.0)
Monocytes Relative: 7 %
Neutro Abs: 5.8 10*3/uL (ref 1.7–7.7)
Neutrophils Relative %: 70 %
Platelet Count: 65 10*3/uL — ABNORMAL LOW (ref 150–400)
RBC: 4.87 MIL/uL (ref 3.87–5.11)
RDW: 13.5 % (ref 11.5–15.5)
WBC Count: 8.1 10*3/uL (ref 4.0–10.5)
nRBC: 0 % (ref 0.0–0.2)

## 2024-12-20 LAB — CMP (CANCER CENTER ONLY)
ALT: 19 U/L (ref 0–44)
AST: 23 U/L (ref 15–41)
Albumin: 4.2 g/dL (ref 3.5–5.0)
Alkaline Phosphatase: 85 U/L (ref 38–126)
Anion gap: 13 (ref 5–15)
BUN: 34 mg/dL — ABNORMAL HIGH (ref 8–23)
CO2: 23 mmol/L (ref 22–32)
Calcium: 9.3 mg/dL (ref 8.9–10.3)
Chloride: 104 mmol/L (ref 98–111)
Creatinine: 1.28 mg/dL — ABNORMAL HIGH (ref 0.44–1.00)
GFR, Estimated: 43 mL/min — ABNORMAL LOW
Glucose, Bld: 91 mg/dL (ref 70–99)
Potassium: 4.5 mmol/L (ref 3.5–5.1)
Sodium: 140 mmol/L (ref 135–145)
Total Bilirubin: 0.7 mg/dL (ref 0.0–1.2)
Total Protein: 7 g/dL (ref 6.5–8.1)

## 2024-12-20 LAB — LACTATE DEHYDROGENASE: LDH: 174 U/L (ref 105–235)

## 2024-12-20 LAB — SAVE SMEAR(SSMR), FOR PROVIDER SLIDE REVIEW

## 2024-12-20 NOTE — Progress Notes (Signed)
 " Hematology and Oncology Follow Up Visit  Meredith Dominguez [id number] 10-24-1951 74 y.o. 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  She is doing pretty well.  She now has a boyfriend.  I am so happy for her.  We last saw her back in October.  Since then, she has really done nicely.  She had an echocardiogram that was done in November.  Unfortunately, still show that she had a marginal ejection fraction of 25-30%.  She is on diuretics..  She does see cardiology every 4 months.  She has had no bleeding.  She has had no bruising.  She her appetite has been okay.  She had no problems over the Holiday season.  She had a very nice Christmas and New Years.  She has had no change in bowel or bladder habits.  There has been no leg swelling.  Again, she is on diuretics.  When we last saw her, she still has an elevated lambda light chain of 37 mg/dL.  Her kappa light chain was 3.1 mg/dL.  There is no monoclonal spike in her blood.  She does have the thrombocytopenia which is chronic.  Currently, I will say that her performance status is probably ECOG 2.     Wt Readings from Last 3 Encounters:  10/30/24 130 lb 6.4 oz (59.1 kg)  10/05/24 130 lb (59 kg)  09/28/24 128 lb 1.4 oz (58.1 kg)    Medications:  Allergies as of 12/20/2024       Reactions   Monosodium Glutamate Other (See Comments)   Hot and feels badly with fatigue, Flushing   Red Dye #40 (allura Red) Other (See Comments)   NO DYES!!   Gluten Meal Other (See Comments)   Sensitivity   Other Other (See Comments)   Red places near the collarbone- might be connected to Von Willebrand disease    Sesame Oil Rash   Turns bright, ear and anus open and close.   Tomato Other (See Comments)   And, other  night shade vegetables like eggplants   Wheat Other (See Comments)   Stomach issues- can tolerate organic wheat   Sulfa Antibiotics Rash   Sulfa Antibiotics Rash        Medication List        Accurate as of December 20, 2024  2:03 PM. If you have any questions, ask your nurse or doctor.          atorvastatin  40 MG tablet Commonly known as: LIPITOR Take 1 tablet (40 mg total) by mouth daily.   CALCIUM  & VIT D3 BONE HEALTH PO Take 1 tablet by mouth in the morning, at noon, and at bedtime.   CALCIUM  LACTATE PO Take 3 tablets by mouth daily as needed (for bleeds).   empagliflozin  10 MG Tabs tablet Commonly known as: JARDIANCE  Take 1 tablet (10 mg total) by mouth daily.   folic acid  1 MG tablet Commonly known as: FOLVITE  Take 2 tablets (2 mg total) by mouth daily.   furosemide  40 MG tablet Commonly known as: LASIX  TAKE FUROSEMIDE  40MG  ONE DAY, ALTERNATING WITH 20MG  THE NEXT DAY---IF YOU FEEL LIKE YOUR GAINING FLUID YOU  MAY TAKE 40MG  DAILY FOR 1 WEEK   MAGNESIUM  CITRATE PO Take 150 mg by mouth daily.  metoprolol  succinate 25 MG 24 hr tablet Commonly known as: TOPROL -XL Take 0.5 tablets (12.5 mg total) by mouth daily. Take with or immediately following a meal.   NON FORMULARY Take 9 drops by mouth See admin instructions. [place] 200C Bioactive Nutritional- Place 9 drops in the mouth a day as needed for heart health   OVER THE COUNTER MEDICATION Take 1 tablet by mouth. HYDROXO B12 WITH FOLINIC ACID - Chew 1 tablet by mouth once a day as needed to combat Von Willebrand disease   OVER THE COUNTER MEDICATION daily. Ferro food.   OVER THE COUNTER MEDICATION Take 1 tablet by mouth in the morning and at bedtime. For-Til B12 Supplement   OVER THE COUNTER MEDICATION Take 1 tablet by mouth daily. PurDentix Oral Health   OVER THE COUNTER MEDICATION Take by mouth in the morning and at bedtime. PARAPLUS- FOR PARASITES   spironolactone  25 MG tablet Commonly known as:  ALDACTONE  Take 0.5 tablet (12.5 mg total) by mouth daily.   UNABLE TO FIND Take 1 tablet by mouth daily. Med Name: Cyruta Plus        Allergies:  Allergies  Allergen Reactions   Monosodium Glutamate Other (See Comments)    Hot and feels badly with fatigue, Flushing   Red Dye #40 (Allura Red) Other (See Comments)    NO DYES!!   Gluten Meal Other (See Comments)    Sensitivity    Other Other (See Comments)    Red places near the collarbone- might be connected to Von Willebrand disease    Sesame Oil Rash    Turns bright, ear and anus open and close.   Tomato Other (See Comments)    And, other night shade vegetables like eggplants   Wheat Other (See Comments)    Stomach issues- can tolerate organic wheat   Sulfa Antibiotics Rash   Sulfa Antibiotics Rash    Past Medical History, Surgical history, Social history, and Family History were reviewed and updated.  Review of Systems:  Review of Systems  Constitutional: Negative.   HENT: Negative.    Respiratory:  Positive for shortness of breath.   Cardiovascular:  Positive for palpitations.  Gastrointestinal:  Positive for nausea.  Genitourinary: Negative.   Musculoskeletal:  Positive for joint pain and myalgias.  Skin: Negative.   Neurological: Negative.   Endo/Heme/Allergies:  Bruises/bleeds easily.  Psychiatric/Behavioral: Negative.       Physical Exam:  Vital signs show temperature of 97.6.  Pulse 53.  Blood pressure 94/49.  Weight is 131 pounds.  Wt Readings from Last 3 Encounters:  10/30/24 130 lb 6.4 oz (59.1 kg)  10/05/24 130 lb (59 kg)  09/28/24 128 lb 1.4 oz (58.1 kg)    Physical Exam Vitals reviewed.  HENT:     Head: Normocephalic and atraumatic.  Eyes:     Pupils: Pupils are equal, round, and reactive to light.  Cardiovascular:     Rate and Rhythm: Normal rate and regular rhythm.     Heart sounds: Normal heart sounds.  Pulmonary:     Effort: Pulmonary effort is normal.     Breath sounds: Normal  breath sounds.  Abdominal:     General: Bowel sounds are normal.     Palpations: Abdomen is soft.  Musculoskeletal:        General: No tenderness or deformity. Normal range of motion.     Cervical back: Normal range of motion.  Lymphadenopathy:     Cervical: No cervical adenopathy.  Skin:    General:  Skin is warm and dry.     Findings: No erythema or rash.  Neurological:     Mental Status: She is alert and oriented to person, place, and time.  Psychiatric:        Behavior: Behavior normal.        Thought Content: Thought content normal.        Judgment: Judgment normal.     Lab Results  Component Value Date   WBC 8.1 12/20/2024   HGB 14.7 12/20/2024   HCT 44.2 12/20/2024   MCV 90.8 12/20/2024   PLT 65 (L) 12/20/2024   Lab Results  Component Value Date   FERRITIN 566 (H) 09/20/2024   IRON 91 09/20/2024   TIBC 290 09/20/2024   UIBC 199 09/20/2024   IRONPCTSAT 31 09/20/2024   Lab Results  Component Value Date   RBC 4.87 12/20/2024   Lab Results  Component Value Date   KPAFRELGTCHN 30.8 (H) 09/20/2024   LAMBDASER 370.3 (H) 09/20/2024   KAPLAMBRATIO 0.08 (L) 09/20/2024   Lab Results  Component Value Date   IGGSERUM 1,071 09/20/2024   IGA 131 09/20/2024   IGMSERUM 121 09/20/2024   Lab Results  Component Value Date   TOTALPROTELP 6.5 09/20/2024   ALBUMINELP 3.8 06/20/2024   A1GS 0.2 06/20/2024   A2GS 0.5 06/20/2024   BETS 0.6 (L) 06/20/2024   GAMS 0.9 06/20/2024   MSPIKE Not Observed 06/20/2024   SPEI Comment 06/20/2024     Chemistry      Component Value Date/Time   NA 140 11/01/2024 1031   K 4.2 11/01/2024 1031   CL 105 11/01/2024 1031   CO2 22 11/01/2024 1031   BUN 30 (H) 11/01/2024 1031   CREATININE 0.89 11/01/2024 1031   CREATININE 1.03 (H) 09/20/2024 1212      Component Value Date/Time   CALCIUM  9.4 11/01/2024 1031   ALKPHOS 76 10/30/2024 1126   AST 18 10/30/2024 1126   AST 23 09/20/2024 1212   ALT 17 10/30/2024 1126   ALT 20 09/20/2024  1212   BILITOT 1.2 10/30/2024 1126   BILITOT 0.7 09/20/2024 1212       Impression and Plan:  Ms. State is a a very pleasant 74 yo caucasian female with history of type IIA Von Willebrand disease. 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   Clearly, the primary issue now is cardiac.  I know that cardiology is following her closely.  For right now, her monoclonal studies have been holding pretty steady.  Will plan to get her back in about 3 months.  I think this would be reasonable.   Maude JONELLE Crease, MD 1/28/20262:03 PM  "

## 2024-12-21 ENCOUNTER — Ambulatory Visit: Payer: Self-pay | Admitting: Hematology & Oncology

## 2024-12-21 LAB — KAPPA/LAMBDA LIGHT CHAINS
Kappa free light chain: 33.1 mg/L — ABNORMAL HIGH (ref 3.3–19.4)
Kappa, lambda light chain ratio: 0.09 — ABNORMAL LOW (ref 0.26–1.65)
Lambda free light chains: 361.3 mg/L — ABNORMAL HIGH (ref 5.7–26.3)

## 2024-12-21 LAB — IGG, IGA, IGM
IgA: 142 mg/dL (ref 64–422)
IgG (Immunoglobin G), Serum: 1108 mg/dL (ref 586–1602)
IgM (Immunoglobulin M), Srm: 130 mg/dL (ref 26–217)

## 2024-12-27 LAB — IMMUNOFIXATION REFLEX, SERUM
IgA: 144 mg/dL (ref 64–422)
IgG (Immunoglobin G), Serum: 1107 mg/dL (ref 586–1602)
IgM (Immunoglobulin M), Srm: 135 mg/dL (ref 26–217)

## 2024-12-27 LAB — PROTEIN ELECTROPHORESIS, SERUM, WITH REFLEX
A/G Ratio: 1.1 (ref 0.7–1.7)
Albumin ELP: 3.6 g/dL (ref 2.9–4.4)
Alpha-1-Globulin: 0.3 g/dL (ref 0.0–0.4)
Alpha-2-Globulin: 0.9 g/dL (ref 0.4–1.0)
Beta Globulin: 0.9 g/dL (ref 0.7–1.3)
Gamma Globulin: 1.2 g/dL (ref 0.4–1.8)
Globulin, Total: 3.3 g/dL (ref 2.2–3.9)
SPEP Interpretation: 0
Total Protein ELP: 6.9 g/dL (ref 6.0–8.5)

## 2025-02-15 ENCOUNTER — Inpatient Hospital Stay: Admitting: Hematology & Oncology

## 2025-02-15 ENCOUNTER — Inpatient Hospital Stay
